# Patient Record
Sex: Female | Born: 1945 | Race: White | Hispanic: No | Smoking: Former smoker
Health system: Southern US, Community
[De-identification: ages and names within clinical notes are randomized; demographics above are authoritative.]

## PROBLEM LIST (undated history)

## (undated) DIAGNOSIS — Z8489 Family history of other specified conditions: Secondary | ICD-10-CM

## (undated) DIAGNOSIS — T4145XA Adverse effect of unspecified anesthetic, initial encounter: Secondary | ICD-10-CM

## (undated) DIAGNOSIS — T8859XA Other complications of anesthesia, initial encounter: Secondary | ICD-10-CM

## (undated) DIAGNOSIS — E039 Hypothyroidism, unspecified: Secondary | ICD-10-CM

## (undated) DIAGNOSIS — I499 Cardiac arrhythmia, unspecified: Secondary | ICD-10-CM

## (undated) DIAGNOSIS — R112 Nausea with vomiting, unspecified: Secondary | ICD-10-CM

## (undated) DIAGNOSIS — I340 Nonrheumatic mitral (valve) insufficiency: Secondary | ICD-10-CM

## (undated) DIAGNOSIS — C73 Malignant neoplasm of thyroid gland: Secondary | ICD-10-CM

## (undated) DIAGNOSIS — M1990 Unspecified osteoarthritis, unspecified site: Secondary | ICD-10-CM

## (undated) DIAGNOSIS — M8580 Other specified disorders of bone density and structure, unspecified site: Secondary | ICD-10-CM

## (undated) DIAGNOSIS — Z905 Acquired absence of kidney: Secondary | ICD-10-CM

## (undated) DIAGNOSIS — Z9889 Other specified postprocedural states: Secondary | ICD-10-CM

## (undated) DIAGNOSIS — I10 Essential (primary) hypertension: Secondary | ICD-10-CM

## (undated) HISTORY — DX: Malignant neoplasm of thyroid gland: C73

## (undated) HISTORY — PX: THYROIDECTOMY: SHX17

## (undated) HISTORY — PX: TONSILLECTOMY: SUR1361

## (undated) HISTORY — DX: Other specified disorders of bone density and structure, unspecified site: M85.80

## (undated) HISTORY — PX: NEPHRECTOMY: SHX65

## (undated) HISTORY — PX: ABDOMINAL HYSTERECTOMY: SHX81

---
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
## 2013-05-11 HISTORY — PX: OTHER SURGICAL HISTORY: SHX169

## 2013-06-06 ENCOUNTER — Ambulatory Visit: Payer: Self-pay | Admitting: Internal Medicine

## 2013-06-22 ENCOUNTER — Ambulatory Visit: Payer: Self-pay | Admitting: Gastroenterology

## 2013-08-28 ENCOUNTER — Ambulatory Visit: Payer: Self-pay | Admitting: Internal Medicine

## 2014-10-05 DIAGNOSIS — N3020 Other chronic cystitis without hematuria: Secondary | ICD-10-CM | POA: Insufficient documentation

## 2014-10-05 DIAGNOSIS — R350 Frequency of micturition: Secondary | ICD-10-CM | POA: Insufficient documentation

## 2014-10-05 DIAGNOSIS — Z905 Acquired absence of kidney: Secondary | ICD-10-CM | POA: Insufficient documentation

## 2015-09-03 ENCOUNTER — Encounter: Payer: Self-pay | Admitting: Emergency Medicine

## 2015-09-03 ENCOUNTER — Emergency Department
Admission: EM | Admit: 2015-09-03 | Discharge: 2015-09-03 | Disposition: A | Discharge status: Home / Self Care | Payer: Medicare PPO | Attending: Emergency Medicine | Admitting: Emergency Medicine

## 2015-09-03 DIAGNOSIS — Z85850 Personal history of malignant neoplasm of thyroid: Secondary | ICD-10-CM | POA: Diagnosis not present

## 2015-09-03 DIAGNOSIS — R51 Headache: Secondary | ICD-10-CM | POA: Diagnosis present

## 2015-09-03 DIAGNOSIS — I160 Hypertensive urgency: Secondary | ICD-10-CM | POA: Diagnosis not present

## 2015-09-03 HISTORY — DX: Essential (primary) hypertension: I10

## 2015-09-03 LAB — CBC WITH DIFFERENTIAL/PLATELET
Basophils Absolute: 0.1 10*3/uL (ref 0–0.1)
Basophils Relative: 2 %
EOS PCT: 2 %
Eosinophils Absolute: 0.1 10*3/uL (ref 0–0.7)
HEMATOCRIT: 39.3 % (ref 35.0–47.0)
HEMOGLOBIN: 13.1 g/dL (ref 12.0–16.0)
LYMPHS ABS: 1.3 10*3/uL (ref 1.0–3.6)
Lymphocytes Relative: 17 %
MCH: 28 pg (ref 26.0–34.0)
MCHC: 33.4 g/dL (ref 32.0–36.0)
MCV: 83.8 fL (ref 80.0–100.0)
Monocytes Absolute: 0.6 10*3/uL (ref 0.2–0.9)
Monocytes Relative: 8 %
Neutro Abs: 5.4 10*3/uL (ref 1.4–6.5)
Neutrophils Relative %: 71 %
PLATELETS: 213 10*3/uL (ref 150–440)
RBC: 4.69 MIL/uL (ref 3.80–5.20)
RDW: 13.1 % (ref 11.5–14.5)
WBC: 7.6 10*3/uL (ref 3.6–11.0)

## 2015-09-03 LAB — BASIC METABOLIC PANEL
Anion gap: 7 (ref 5–15)
BUN: 13 mg/dL (ref 6–20)
CALCIUM: 9.3 mg/dL (ref 8.9–10.3)
CHLORIDE: 106 mmol/L (ref 101–111)
CO2: 26 mmol/L (ref 22–32)
CREATININE: 0.7 mg/dL (ref 0.44–1.00)
GFR calc Af Amer: >60 mL/min (ref >60)
GFR calc non Af Amer: >60 mL/min (ref >60)
Glucose, Bld: 109 mg/dL — ABNORMAL HIGH (ref 65–99)
Potassium: 4.8 mmol/L (ref 3.5–5.1)
SODIUM: 139 mmol/L (ref 135–145)

## 2015-09-03 MED ORDER — ACETAMINOPHEN 325 MG PO TABS
650.0000 mg | ORAL_TABLET | Freq: Once | ORAL | Status: AC
  Administered 2015-09-03: 650 mg via ORAL
  Filled 2015-09-03: qty 2

## 2015-09-03 MED ORDER — CLONIDINE HCL 0.1 MG PO TABS
0.2000 mg | ORAL_TABLET | Freq: Once | ORAL | Status: AC
  Administered 2015-09-03: 0.2 mg via ORAL
  Filled 2015-09-03: qty 2

## 2015-09-03 MED ORDER — CLONIDINE HCL 0.1 MG PO TABS: 0.1000 mg | ORAL_TABLET | Freq: Two times a day (BID) | ORAL | Status: AC | PRN

## 2015-09-03 NOTE — ED Notes (Signed)
Pt presents to ED with headache and elevated blood pressure; onset around 0545 this morning upon waking. Pt states when she noticed her pressure was elevated she took an extra losartan 25mg . Pt states her blood pressure is typically well controlled with medications.

## 2015-09-03 NOTE — ED Provider Notes (Signed)
Time Seen: Approximately *0715  I have reviewed the triage notes  Chief Complaint: Hypertension and Headache   History of Present Illness: Amanda Livingston is a 70 y.o. female *who presents with the acute onset of the headache that she mainly noticed this morning. Patient states that she over the weekend and had consumed more salt than usual. She states she knows that her blood pressure was significantly elevated this morning at 176/104. She takes a low certain at night and then took an extra one this morning. The headache is described as mild without photophobia or weakness in the upper or lower extremities. She denies any neck pain or ataxia. Patient denies any nausea or vomiting. She states she was concerned mainly because she only has one kidney and she tries to keep her blood pressure under control.   Past Medical History  Diagnosis Date  . Hypertension   . Cancer ([place])     thyroid    There are no active problems to display for this patient.   Past Surgical History  Procedure Laterality Date  . Nephrectomy    . Abdominal hysterectomy    . Tonsillectomy      Past Surgical History  Procedure Laterality Date  . Nephrectomy    . Abdominal hysterectomy    . Tonsillectomy      No current outpatient prescriptions on file.  Allergies:  Review of patient's allergies indicates no known allergies.  Family History: No family history on file.  Social History: Social History  Substance Use Topics  . Smoking status: Never Smoker   . Smokeless tobacco: None  . Alcohol Use: No     Review of Systems:   10 point review of systems was performed and was otherwise negative:  Constitutional: No fever Eyes: No visual disturbances ENT: No sore throat, ear pain Cardiac: No chest pain Respiratory: No shortness of breath, wheezing, or stridor Abdomen: No abdominal pain, no vomiting, No diarrhea Endocrine: No weight loss, No night sweats Extremities: No peripheral edema,  cyanosis Skin: No rashes, easy bruising Neurologic: No focal weakness, trouble with speech or swollowing Urologic: No dysuria, Hematuria, or urinary frequency   Physical Exam:  ED Triage Vitals  Enc Vitals Group     BP 09/03/15 0627 188/93 mmHg     Pulse Rate 09/03/15 0627 77     Resp 09/03/15 0627 18     Temp 09/03/15 0627 97.7 F (36.5 C)     Temp Source 09/03/15 0627 Oral     SpO2 09/03/15 0627 97 %     Weight 09/03/15 0627 158 lb (71.668 kg)     Height 09/03/15 0627 5\' 3"  (1.6 m)     Head Cir --      Peak Flow --      Pain Score 09/03/15 0629 5     Pain Loc --      Pain Edu? --      Excl. in [place]? --     General: Awake , Alert , and Oriented times 3; GCS 15 Head: Normal cephalic , atraumatic Eyes: Pupils equal , round, reactive to light Nose/Throat: No nasal drainage, patent upper airway without erythema or exudate.  Neck: Supple, Full range of motion, No anterior adenopathy or palpable thyroid masses Lungs: Clear to ascultation without wheezes , rhonchi, or rales Heart: Regular rate, regular rhythm without murmurs , gallops , or rubs Abdomen: Soft, non tender without rebound, guarding , or rigidity; bowel sounds positive and symmetric in all 4  quadrants. No organomegaly .        Extremities: 2 plus symmetric pulses. No edema, clubbing or cyanosis Neurologic: normal ambulation, Motor symmetric without deficits, sensory intact Skin: warm, dry, no rashes   Labs:   All laboratory work was reviewed including any pertinent negatives or positives listed below:  Labs Reviewed  BASIC METABOLIC PANEL  CBC WITH DIFFERENTIAL/PLATELET    EKG:  ED ECG REPORT I, Daymon Larsen, the attending physician, personally viewed and interpreted this ECG.  Date: 09/03/2015 EKG Time: 0653 Rate: 74 Rhythm: normal sinus rhythm QRS Axis: normal Intervals: normal ST/T Wave abnormalities: normal Conduction Disturbances: none Narrative Interpretation: unremarkable Sinus  arrhythmia No acute ischemic changes were hypertensive findings    ED Course:  Patient's stay here was uneventful and she had symptomatic improvement after giving clonidine 0.2 mg to decrease her blood pressure. She states her headache has resolved. Her blood pressure now is approximately 95 systolic. She'll be discharged for clonidine as needed only. She's been advised continue with her current blood pressure medication and drink plenty of fluids.    Assessment:  Hypertensive urgency      Plan:  Outpatient management Patient was advised to return immediately if condition worsens. Patient was advised to follow up with their primary care physician or other specialized physicians involved in their outpatient care. The patient and/or family member/power of attorney had laboratory results reviewed at the bedside. All questions and concerns were addressed and appropriate discharge instructions were distributed by the nursing staff. Daymon Larsen, MD 09/03/15 [phone]

## 2015-09-03 NOTE — ED Notes (Signed)
Pt presents to ED with c/o headache since last night. Pt reports went to bed but woke up this morning about 5:30 am with anterior headache. Pt took bp at home: 176/104, reports takes 25 mg Losartan at night but reports took another one this morning. Pt denies blurry vision, dizziness, chest pain, or shortness of breath. Pt alert and oriented x 4, no increased work in breathing noted. Skin warm and dry, pt placed on cardiac monitor, will continue to monitor.

## 2016-02-21 DIAGNOSIS — I10 Essential (primary) hypertension: Secondary | ICD-10-CM | POA: Insufficient documentation

## 2016-07-15 DIAGNOSIS — I519 Heart disease, unspecified: Secondary | ICD-10-CM | POA: Insufficient documentation

## 2016-07-24 DIAGNOSIS — I493 Ventricular premature depolarization: Secondary | ICD-10-CM | POA: Insufficient documentation

## 2016-10-06 DIAGNOSIS — R339 Retention of urine, unspecified: Secondary | ICD-10-CM | POA: Insufficient documentation

## 2016-11-25 ENCOUNTER — Other Ambulatory Visit: Payer: Self-pay | Admitting: Internal Medicine

## 2016-11-25 DIAGNOSIS — R05 Cough: Secondary | ICD-10-CM

## 2016-11-25 DIAGNOSIS — Z87891 Personal history of nicotine dependence: Secondary | ICD-10-CM

## 2016-11-25 DIAGNOSIS — R059 Cough, unspecified: Secondary | ICD-10-CM

## 2016-12-02 ENCOUNTER — Ambulatory Visit
Admission: RE | Admit: 2016-12-02 | Discharge: 2016-12-02 | Disposition: A | Discharge status: Home / Self Care | Payer: Medicare PPO | Source: Ambulatory Visit | Attending: Internal Medicine | Admitting: Internal Medicine

## 2016-12-02 DIAGNOSIS — Z87891 Personal history of nicotine dependence: Secondary | ICD-10-CM | POA: Insufficient documentation

## 2016-12-02 DIAGNOSIS — R05 Cough: Secondary | ICD-10-CM | POA: Diagnosis not present

## 2016-12-02 DIAGNOSIS — R059 Cough, unspecified: Secondary | ICD-10-CM

## 2017-03-08 ENCOUNTER — Other Ambulatory Visit: Payer: Self-pay | Admitting: Otolaryngology

## 2017-03-08 DIAGNOSIS — H93A2 Pulsatile tinnitus, left ear: Secondary | ICD-10-CM

## 2017-03-18 ENCOUNTER — Ambulatory Visit
Admission: RE | Admit: 2017-03-18 | Discharge: 2017-03-18 | Disposition: A | Discharge status: Home / Self Care | Payer: Medicare PPO | Source: Ambulatory Visit | Attending: Otolaryngology | Admitting: Otolaryngology

## 2017-03-18 DIAGNOSIS — H93A2 Pulsatile tinnitus, left ear: Secondary | ICD-10-CM | POA: Insufficient documentation

## 2017-04-26 ENCOUNTER — Ambulatory Visit: Payer: Medicare PPO | Admitting: Internal Medicine

## 2017-04-26 ENCOUNTER — Encounter: Payer: Self-pay | Admitting: Internal Medicine

## 2017-04-26 VITALS — BP 120/80 | HR 68 | Resp 16 | Ht 64.0 in | Wt 154.0 lb

## 2017-04-26 DIAGNOSIS — H9312 Tinnitus, left ear: Secondary | ICD-10-CM | POA: Diagnosis not present

## 2017-04-26 DIAGNOSIS — M25552 Pain in left hip: Secondary | ICD-10-CM | POA: Diagnosis not present

## 2017-04-26 DIAGNOSIS — R002 Palpitations: Secondary | ICD-10-CM

## 2017-04-26 DIAGNOSIS — Z0001 Encounter for general adult medical examination with abnormal findings: Secondary | ICD-10-CM | POA: Diagnosis not present

## 2017-04-26 DIAGNOSIS — E785 Hyperlipidemia, unspecified: Secondary | ICD-10-CM | POA: Insufficient documentation

## 2017-04-26 NOTE — Progress Notes (Signed)
Amanda Livingston [place], [place] [address]  Internal MEDICINE  Office Visit Note  Patient Name: Amanda Livingston  [id number]  [id number]  Date of Service: 04/26/2017  Complaints/HPI Pt is here for routine health maintenance examination:  C /o left ear ringing, had MRA done by another physician. Pt reports it to be negative.  Pt follows up with endocrinology for thyroid cancer and hypothyroidism.Pt likes her T4 to be a little elevated. C/O Left leg pain ? sciatica   Current Medication: Outpatient Encounter Medications as of 04/26/2017  Medication Sig  . Biotin 1000 MCG tablet Take 1,000 mcg by mouth daily.  . Calcium Carb-Ergocalciferol 500-200 MG-UNIT TABS Take by mouth.  . cloNIDine (CATAPRES) 0.1 MG tablet Take 1 tablet (0.1 mg total) by mouth 2 (two) times daily as needed.  Marland Kitchen estradiol (ESTRACE) 0.5 MG tablet Take 0.5 mg by mouth daily.  . fluticasone (FLONASE) 50 MCG/ACT nasal spray U 2 SPRAYS IEN ONCE D  . FLUZONE HIGH-DOSE 0.5 ML injection ADM 0.5ML IM UTD  . levothyroxine (SYNTHROID) 137 MCG tablet Take by mouth.  . losartan (COZAAR) 25 MG tablet Take 25 mg by mouth 2 (two) times daily.  . metoprolol succinate (TOPROL-XL) 25 MG 24 hr tablet Take 25 mg by mouth daily. take 1/2 tablet  . Biotin 1 MG CAPS Take by mouth.  . calcium-vitamin D (OSCAL WITH D) 500-200 MG-UNIT tablet Take 1 tablet by mouth daily.  . Omega-3 Fatty Acids (FISH OIL) 1000 MG CAPS Take by mouth daily.  Marland Kitchen tretinoin (RETIN-A) 0.025 % cream Apply topically as directed.  . vitamin C (ASCORBIC ACID) 500 MG tablet Take 500 mg by mouth daily.   No facility-administered encounter medications on file as of 04/26/2017.     Surgical History: Past Surgical History:  Procedure Laterality Date  . ABDOMINAL HYSTERECTOMY    . NEPHRECTOMY    . THYROIDECTOMY    . TONSILLECTOMY      Medical History: Past Medical History:  Diagnosis Date  . Cancer ([place])    thyroid  . Hypertension    . Osteopenia     Family History: Family History  Problem Relation Age of Onset  . Cancer Father       Review of Systems  Genitourinary: Negative.   Psychiatric/Behavioral: Negative.     General: (-) fever, (-) chills, (-) night sweats, (-) weakness, (-) changes in appetite. Skin: (-) rashes, (-) itching,. Eyes: (-) visual changes, (-) redness, (-) itching, (-) double or blurred vision. Nose and Sinuses: (-) nasal stuffiness or itchiness, (-) postnasal drip, (-) nosebleeds, (-) sinus trouble. Mouth and Throat: (-) sore throat, (-) hoarseness. Neck: (-) swollen glands, (-) enlarged thyroid, (-) neck pain. Respiratory: (-) cough, (-) bloody sputum, (-) shortness of breath, (-) wheezing. Cardiovascular: (-) ankle swelling, (-) chest pain. Lymphatic: (-) lymph node enlargement, (-) lymph node tenderness. Neurologic: (-) numbness, (-) tingling,(-) dizziness. Psychiatric: (-) anxiety, (-) depression.  Vital Signs: Blood pressure 120/80, pulse 68, resp. rate 16, height 5\' 4"  (1.626 m), weight 154 lb (69.9 kg), SpO2 98 %.  Examination: General Appearance: The patient is well-developed, well-nourished, and in no distress. Skin: Gross inspection of skin demonstrates no evidence of abnormality. Head: Patient's head is normocephalic, no gross deformities. Eyes: no gross deformities noted. ENT: ears appear grossly normal. Nasopharynx appears to be normal. Neck: Supple. No thyromegaly. No LAD. Respiratory: Lungs are clear to auscultation with no adventitious sounds. Cardiovascular: Normal S1 and S2 without murmur or rub.  Extremities: No cyanosis. pulses are equal. Neurologic: Alert and oriented. No involuntary movements. GI. Neg Breast wnl  LABS: No results found for this or any previous visit (from the past 2160 hour(s)).  Radiology: Mr Amanda Livingston [place] Contrast  Result Date: 03/18/2017 CLINICAL DATA:  71 year old female with pulsatile tinnitus in the left ear for 3-4 months. No  known injury. EXAM: MRA HEAD WITHOUT CONTRAST TECHNIQUE: Angiographic images of the Circle of Willis were obtained using MRA technique without intravenous contrast. COMPARISON:  None. FINDINGS: Visible cerebral volume appears normal with no intracranial mass effect or ventriculomegaly evident. Antegrade flow in the posterior circulation with codominant and normal appearing distal vertebral arteries. Patent PICA origins and vertebrobasilar junction. The Basilar artery, SCA (the left SCA appears duplicated, normal variant) and AICA origins appear normal. Normal PCA origins. Posterior communicating arteries are diminutive or absent. Bilateral PCA branches are within normal limits. No abnormal flow signal identified about either temporal bone. Antegrade flow in both ICA siphons. No siphon stenosis. Normal ophthalmic artery origins. Normal carotid termini, MCA and ACA origins. Anterior communicating artery and visible bilateral ACA branches are within normal limits. Bilateral MCA M1 segments, MCA bifurcations, and visualized bilateral MCA branches are within normal limits. IMPRESSION: Negative intracranial MRA.  No explanation for pulsatile tinnitus. Electronically Signed   By: Genevie Ann M.D.   On: 03/18/2017 14:24    Assessment and Plan: Patient Active Problem List   Diagnosis Date Noted  . Palpitations 04/26/2017  . Hyperlipidemia 04/26/2017  . Incomplete emptying of bladder 10/06/2016  . Premature ventricular contractions 07/24/2016  . Left ventricular diastolic dysfunction 29/47/6546  . Essential hypertension 02/21/2016  . Acquired absence of kidney 10/05/2014  . Chronic cystitis 10/05/2014  . Increased frequency of urination 10/05/2014   Assessment /plan  1. Encounter for general adult medical examination with abnormal findings Routine labs discussed with the pt  2. Palpitations  - US Carotid Duplex Bilateral; Future  3. Hyperlipidemia, unspecified hyperlipidemia type - Stable with meds   4.  Tinnitus of left ear - Await lower the dose of Synthroid   5. Pain of left hip joint - Increase ROM through exercise  General Counseling: I have discussed the findings of the evaluation and examination with Amanda Livingston.  I have also discussed any further diagnostic evaluation thatmay be needed or ordered today. Derotha verbalizes understanding of the findings of todays visit. We also reviewed her medications today and discussed drug interactions and side effects including but not limited excessive drowsiness and altered mental states. We also discussed that there is always a risk not just to her but also people around her. she has been encouraged to call the office with any questions or concerns that should arise related to todays visit.   Time spent: 30 min  I have personally obtained a history, examined the patient, evaluated laboratory and imaging results, formulated the assessment and plan and placed orders.   Lavera Guise, MD  Internal Medicine

## 2017-05-31 ENCOUNTER — Other Ambulatory Visit (INDEPENDENT_AMBULATORY_CARE_PROVIDER_SITE_OTHER): Payer: Medicare HMO

## 2017-05-31 DIAGNOSIS — R0989 Other specified symptoms and signs involving the circulatory and respiratory systems: Secondary | ICD-10-CM | POA: Diagnosis not present

## 2017-05-31 DIAGNOSIS — R002 Palpitations: Secondary | ICD-10-CM

## 2017-06-14 ENCOUNTER — Encounter: Payer: Self-pay | Admitting: Internal Medicine

## 2017-06-14 DIAGNOSIS — F17210 Nicotine dependence, cigarettes, uncomplicated: Secondary | ICD-10-CM | POA: Insufficient documentation

## 2017-06-14 DIAGNOSIS — E039 Hypothyroidism, unspecified: Secondary | ICD-10-CM | POA: Insufficient documentation

## 2017-06-15 ENCOUNTER — Encounter: Payer: Self-pay | Admitting: Internal Medicine

## 2017-06-15 ENCOUNTER — Ambulatory Visit (INDEPENDENT_AMBULATORY_CARE_PROVIDER_SITE_OTHER): Payer: Medicare HMO | Admitting: Internal Medicine

## 2017-06-15 VITALS — BP 136/69 | HR 68 | Resp 16 | Ht 64.0 in | Wt 159.2 lb

## 2017-06-15 DIAGNOSIS — I6523 Occlusion and stenosis of bilateral carotid arteries: Secondary | ICD-10-CM

## 2017-06-15 DIAGNOSIS — R002 Palpitations: Secondary | ICD-10-CM

## 2017-06-15 DIAGNOSIS — I10 Essential (primary) hypertension: Secondary | ICD-10-CM | POA: Diagnosis not present

## 2017-06-15 DIAGNOSIS — E039 Hypothyroidism, unspecified: Secondary | ICD-10-CM

## 2017-06-15 MED ORDER — LOSARTAN POTASSIUM 25 MG PO TABS: 25.0000 mg | ORAL_TABLET | Freq: Two times a day (BID) | ORAL | 3 refills | Status: DC

## 2017-06-15 MED ORDER — TRETINOIN 0.025 % EX CREA: TOPICAL_CREAM | CUTANEOUS | 0 refills | Status: DC

## 2017-06-15 MED ORDER — ESTRADIOL 0.5 MG PO TABS: 0.5000 mg | ORAL_TABLET | Freq: Every day | ORAL | 3 refills | Status: DC

## 2017-06-15 NOTE — Progress Notes (Signed)
[facility] [place], [place] [address]  Internal MEDICINE  Office Visit Note  Patient Name: Amanda Livingston  [id number]  [id number]  Date of Service: 06/15/2017  Chief Complaint  Patient presents with  . Follow-up    carotid results    HPI  Pt is here for routine follow up.    Current Medication: Outpatient Encounter Medications as of 06/15/2017  Medication Sig  . Calcium 500 MG CHEW Chew by mouth daily.  . Cholecalciferol (VITAMIN D3) 1000 units CAPS Take by mouth.  . Biotin 1 MG CAPS Take by mouth.  . Biotin 1000 MCG tablet Take 1,000 mcg by mouth daily.  . cloNIDine (CATAPRES) 0.1 MG tablet Take 1 tablet (0.1 mg total) by mouth 2 (two) times daily as needed. (Patient taking differently: Take by mouth as needed. )  . estradiol (ESTRACE) 0.5 MG tablet Take 0.5 mg by mouth daily.  . fluticasone (FLONASE) 50 MCG/ACT nasal spray U 2 SPRAYS IEN ONCE D  . levothyroxine (SYNTHROID) 137 MCG tablet Take by mouth.  . losartan (COZAAR) 25 MG tablet Take 25 mg by mouth 2 (two) times daily.  . metoprolol succinate (TOPROL-XL) 25 MG 24 hr tablet Take 25 mg by mouth daily. take 1/2 tablet  . Omega-3 Fatty Acids (FISH OIL) 1000 MG CAPS Take by mouth daily.  Marland Kitchen tretinoin (RETIN-A) 0.025 % cream Apply topically as directed.  . vitamin C (ASCORBIC ACID) 500 MG tablet Take 500 mg by mouth daily.  . [DISCONTINUED] Calcium Carb-Ergocalciferol 500-200 MG-UNIT TABS Take by mouth.  . [DISCONTINUED] calcium-vitamin D (OSCAL WITH D) 500-200 MG-UNIT tablet Take 1 tablet by mouth daily.  . [DISCONTINUED] FLUZONE HIGH-DOSE 0.5 ML injection ADM 0.5ML IM UTD   No facility-administered encounter medications on file as of 06/15/2017.     Surgical History: Past Surgical History:  Procedure Laterality Date  . ABDOMINAL HYSTERECTOMY    . NEPHRECTOMY Left   . THYROIDECTOMY    . TONSILLECTOMY Right     Medical History: Past Medical History:  Diagnosis Date  . Hypertension   .  Osteopenia   . 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[place])    thyroid    Family History: Family History  Problem Relation Age of Onset  . Cancer Father     Social History   Socioeconomic History  . Marital status: Divorced    Spouse name: Not on file  . Number of children: Not on file  . Years of education: Not on file  . Highest education level: Not on file  Social Needs  . Financial resource strain: Not on file  . Food insecurity - worry: Not on file  . Food insecurity - inability: Not on file  . Transportation needs - medical: Not on file  . Transportation needs - non-medical: Not on file  Occupational History  . Not on file  Tobacco Use  . Smoking status: Never Smoker  . Smokeless tobacco: Never Used  Substance and Sexual Activity  . Alcohol use: No  . Drug use: No  . Sexual activity: Not on file  Other Topics Concern  . Not on file  Social History Narrative  . Not on file   Review of Systems  Constitutional: Negative for chills, diaphoresis and fatigue.  HENT: Negative for ear pain, postnasal drip and sinus pressure.   Eyes: Negative for photophobia, discharge, redness, itching and visual disturbance.  Respiratory: Negative for cough, shortness of breath and wheezing.   Cardiovascular: Negative for chest pain, palpitations and  leg swelling.  Gastrointestinal: Negative for abdominal pain, constipation, diarrhea, nausea and vomiting.  Genitourinary: Negative for dysuria and flank pain.  Musculoskeletal: Negative for arthralgias, back pain, gait problem and neck pain.  Skin: Negative for color change.  Allergic/Immunologic: Negative for environmental allergies and food allergies.  Neurological: Negative for dizziness and headaches.  Hematological: Does not bruise/bleed easily.  Psychiatric/Behavioral: Negative for agitation, behavioral problems (depression ) and hallucinations.   Vital Signs: BP 136/69 (BP Location: Left Arm, Patient Position: Sitting)   Pulse 68   Resp 16   Ht  5\' 4"  (1.626 m)   Wt 159 lb 3.2 oz (72.2 kg)   SpO2 96%   BMI 27.33 kg/m   Physical Exam  Constitutional: She is oriented to person, place, and time. She appears well-developed and well-nourished. No distress.  HENT:  Head: Normocephalic and atraumatic.  Mouth/Throat: Oropharynx is clear and moist. No oropharyngeal exudate.  Eyes: EOM are normal. Pupils are equal, round, and reactive to light.  Neck: Normal range of motion. Neck supple. No JVD present.  Cardiovascular: Normal rate, regular rhythm and normal heart sounds.  No murmur heard. Pulmonary/Chest: Effort normal. No respiratory distress. She exhibits no tenderness.  Abdominal: Soft. Bowel sounds are normal.  Musculoskeletal: Normal range of motion.  Lymphadenopathy:    She has no cervical adenopathy.  Neurological: She is alert and oriented to person, place, and time. No cranial nerve deficit.  Skin: Skin is warm and dry. She is not diaphoretic.  Psychiatric: She has a normal mood and affect. Her behavior is normal.   Assessment/Plan: 1. Essential hypertension, benign - losartan (COZAAR) 25 MG tablet; Take 1 tablet (25 mg total) by mouth 2 (two) times daily.  Dispense: 90 tablet; Refill: 3  2. Hypothyroidism, unspecified type - Continue synthroid as before  3. Atherosclerosis of both carotid arteries - ASA 81 mg qd General Counseling: jara feider understanding of the findings of todays visit and agrees with plan of treatment. I have discussed any further diagnostic evaluation that may be needed or ordered today. We also reviewed her medications today. she has been encouraged to call the office with any questions or concerns that should arise related to todays visit.  Time spent: [place] [facility]

## 2017-06-22 NOTE — Patient Instructions (Signed)
Atherosclerosis °Atherosclerosis is narrowing and hardening of the blood vessels (arteries). Arteries are tubes that carry blood that contains oxygen from the heart to all parts of the body. Arteries can become narrow or clogged with a buildup of fat, cholesterol, calcium, or other substances (plaque). Plaque decreases the amount of blood that can flow through the artery. °Atherosclerosis can affect any artery in the body, including: °· Heart arteries (coronary artery disease), which may cause heart attack. °· Brain arteries, which may cause stroke. °· Leg, arm, and pelvis arteries (peripheral artery disease), which may cause pain and numbness. °· Kidney arteries, which may cause kidney (renal) failure. ° °Treatment may slow the disease and prevent further damage to the heart, brain, peripheral arteries, and kidneys. °What are the causes? °Atherosclerosis develops when plaque forms in an artery. This damages the inside wall of the artery. Over time, the plaque grows and hardens. It may break through the artery wall. This causes a blood clot to form over the break, which narrows the artery more. The clot may also break loose and travel to other arteries, causing more damage. °What increases the risk? °This condition is more likely to develop in people who: °· Are middle age or older. °· Have a family history of atherosclerosis. °· Have high cholesterol. °· Have high blood fats (triglycerides). °· Have diabetes. °· Are overweight. °· Smoke tobacco. °· Do not exercise enough. °· Have a substance in the blood that indicates increased levels of inflammation in the body (C-reactive protein, or CRP). °· Have sleep apnea. °· Are stressed. °· Drink too much alcohol. ° °What are the signs or symptoms? °This condition may not cause any symptoms. If you do have symptoms, they are caused by damage to an area of your body that is not getting enough blood. The following symptoms are possible: °· Coronary artery disease may cause  chest pain and shortness of breath. °· Decreased blood supply to your brain may cause a stroke. Signs and symptoms of stroke may include sudden: °? Weakness on one side of the body. °? Confusion. °? Changes in vision. °? Inability to speak or understand speech. °? Loss of balance, coordination, or ability to walk. °? Severe headache. °? Loss of consciousness. °· Peripheral artery disease may cause pain and numbness, often in the legs and hips. °· Renal failure may cause fatigue, nausea, swelling, and itchy skin. ° °How is this diagnosed? °This condition is diagnosed based on your medical history and a physical exam. During the exam, your health care provider will check your pulses and listen for a "whooshing" sound over your arteries (bruit). You may have tests, such as: °· Blood tests to check your levels of cholesterol, triglycerides, and CRP. °· Electrocardiogram (ECG) to check for heart damage. °· Chest X-ray to see if your heart is enlarged, which is a sign of heart failure. °· Stress test to see how your heart reacts to exercise. °· Echocardiogram to get images of the inside of your heart. °· Ankle-Brachial index to compare blood pressure in your arms to blood pressure in your ankles. °· Ultrasound of your peripheral arteries to check blood flow. °· CT scan to check for damage to your heart or brain. °· X-rays of blood vessels after dye has been injected (angiogram) to check blood flow. ° °How is this treated? °Treatment starts with lifestyle changes, which may include: °· Changing your diet. °· Losing weight. °· Reducing stress. °· Exercising and being more physically active. °· Not smoking. ° °  You also may need medicine to: °· Lower triglycerides and cholesterol. °· Lower and control blood pressure. °· Prevent blood clots. °· Lower inflammation in your body. °· Lower and control your blood sugar. ° °Sometimes, surgery is needed to remove plaque, widen your artery, or create a new path for your blood  (bypass). Surgical treatment may include: °· Removing plaque from an artery (endarterectomy). °· Opening a narrowed heart artery (angioplasty). °· Heart or peripheral artery bypass graft surgery. ° °Follow these instructions at home: °Lifestyle ° °· Eat a heart-healthy diet. Talk to your health care provider or a diet specialist (dietitian) if you need help. A heart-healthy diet includes: °? Limiting unhealthy fats and increasing healthy fats. Some examples of healthy fats are olive oil and canola oil. °? Eating plant-based foods, such as fruits, vegetables, nuts, legumes, and whole grains. °· Follow an exercise program as told by your health care provider. °· Maintain a healthy weight. Lose weight if directed by your health care provider. °· Rest when you are tired. °· Learn to manage your stress. °· Do not use any tobacco products, such as cigarettes, chewing tobacco, and e-cigarettes. If you need help quitting, ask your health care provider. °· Limit alcohol intake to no more than 1 drink a day for nonpregnant women and 2 drinks a day for men. One drink equals 12 oz of beer, 5 oz of wine, or 1½ oz of hard liquor. °· Do not abuse drugs. °General instructions °· Take over-the-counter and prescription medicines only as told by your health care provider. °· Manage other health conditions as told by your health care provider. °· Keep all follow-up visits as told by your health care provider. This is important. °Contact a health care provider if: °· You have chest pain or discomfort. This includes squeezing chest pain that may feel like indigestion (angina). °· You have shortness of breath. °· You have an irregular heartbeat. °· You have unexplained fatigue. °· You have unexplained pain or numbness in an arm, leg, or hip. °· You have nausea, swelling of your hands or feet, and itchy skin. °Get help right away if: °· You have symptoms of a heart attack, such as: °? Chest pain. °? Shortness of breath. °? Pain in your  neck, jaw, arms, back, or stomach. °? Cold sweat. °? Nausea. °? Light-headedness. °· You have symptoms of a stroke, such as sudden: °? Weakness on one side of your body. °? Confusion. °? Changes in vision. °? Inability to speak or understand speech. °? Loss of balance, coordination, or ability to walk. °? Severe headache. °? Loss of consciousness. °These symptoms may represent a serious problem that is an emergency. Do not wait to see if the symptoms will go away. Get medical help right away. Call your local emergency services (911 in the [place] Do not drive yourself to the hospital. °This information is not intended to replace advice given to you by your health care provider. Make sure you discuss any questions you have with your health care provider. °Document Released: 07/18/2003 Document Revised: 10/03/2015 Document Reviewed: 03/18/2015 °Elsevier Interactive Patient Education © 2018 [employer] ° °

## 2017-06-22 NOTE — Progress Notes (Unsigned)
[place], [place] [address]  DATE OF SERVICE:  May 31, 2017  CAROTID DOPPLER INTERPRETATION:  Bilateral Carotid Ultrsasound and Color Doppler Examination was performed. The RIGHT CCA shows  mild plaque in the vessel. The LEFT CCA shows  mild plaque in the vessel. There was  no intimal thickening noted in the RIGHT carotid artery. There was  no intimal thickening in the LEFT carotid artery.  The RIGHT CCA shows peak systolic velocity of  34LP per second. The end diastolic velocity is  37TK per second on the RIGHT side. The RIGHT ICA shows peak systolic velocity of  66 per second. RIGHT sided ICA end diastolic velocity is  24OX per second. The RIGHT ECA shows a peak systolic velocity of  73ZH per second. The ICA/CCA ratio is calculated to be  0.93. This suggests  No significant stenosis. The Vertebral Artery shows  antegrade flow.  There appears to be no hemodynamically significant stenosis on the RIGHT side.  The LEFT CCA shows peak systolic velocity of  29JM per second. The end diastolic velocity is  42AS per second on the LEFT side. The LEFT ICA shows peak systolic velocity of  34HDQ second. LEFT sided ICA end diastolic velocity is  22WL per second. The LEFT ECA shows a peak systolic velocity of  79GX per second. The ICA/CCA ratio is calculated to be  1.19. This suggests  No significant stenosis. The Vertebral Artery shows  antegrade flow.  There appears to be  No hemodynamically significant stenosis on the LEFT side.   Impression:    There  Is no significant Carotid stenosis noted on the RIGHT side. There  Is no significant Carotid stenosis noted on the LEFT side. There is  mild plaque formation noted on the LEFT and  also on the RIGHT  side. Consider a repeat Carotid doppler if clinical situation and symptoms warrant in 6-12 months. Patient should be encouraged to change lifestyles such as smoking cessation, regular exercise and dietary modification. Use  of statins in the right clinical setting and ASA is encouraged.  Allyne Gee, MD [facility] Pulmonary Critical Care Medicine

## 2017-06-23 NOTE — Procedures (Signed)
[place], [place] [address]  DATE OF SERVICE:  May 31, 2017  CAROTID DOPPLER INTERPRETATION:  Bilateral Carotid Ultrsasound and Color Doppler Examination was performed. The RIGHT CCA shows  mild plaque in the vessel. The LEFT CCA shows  mild plaque in the vessel. There was  no intimal thickening noted in the RIGHT carotid artery. There was  no intimal thickening in the LEFT carotid artery.  The RIGHT CCA shows peak systolic velocity of  94FE per second. The end diastolic velocity is  76DY per second on the RIGHT side. The RIGHT ICA shows peak systolic velocity of  66 per second. RIGHT sided ICA end diastolic velocity is  70LK per second. The RIGHT ECA shows a peak systolic velocity of  95FM per second. The ICA/CCA ratio is calculated to be  0.93. This suggests  No significant stenosis. The Vertebral Artery shows  antegrade flow.  There appears to be no hemodynamically significant stenosis on the RIGHT side.  The LEFT CCA shows peak systolic velocity of  73UY per second. The end diastolic velocity is  37QD per second on the LEFT side. The LEFT ICA shows peak systolic velocity of  64RCV second. LEFT sided ICA end diastolic velocity is  81MM per second. The LEFT ECA shows a peak systolic velocity of  03FV per second. The ICA/CCA ratio is calculated to be  1.19. This suggests  No significant stenosis. The Vertebral Artery shows  antegrade flow.  There appears to be  No hemodynamically significant stenosis on the LEFT side.   Impression:    There  Is no significant Carotid stenosis noted on the RIGHT side. There  Is no significant Carotid stenosis noted on the LEFT side. There is  mild plaque formation noted on the LEFT and  also on the RIGHT  side. Consider a repeat Carotid doppler if clinical situation and symptoms warrant in 6-12 months. Patient should be encouraged to change lifestyles such as smoking cessation, regular exercise and dietary modification. Use  of statins in the right clinical setting and ASA is encouraged.  Allyne Gee, MD [facility] Pulmonary Critical Care Medicine

## 2017-07-02 DIAGNOSIS — E559 Vitamin D deficiency, unspecified: Secondary | ICD-10-CM | POA: Diagnosis not present

## 2017-07-02 DIAGNOSIS — E890 Postprocedural hypothyroidism: Secondary | ICD-10-CM | POA: Diagnosis not present

## 2017-07-09 DIAGNOSIS — E890 Postprocedural hypothyroidism: Secondary | ICD-10-CM | POA: Diagnosis not present

## 2017-07-09 DIAGNOSIS — C73 Malignant neoplasm of thyroid gland: Secondary | ICD-10-CM | POA: Diagnosis not present

## 2017-07-27 DIAGNOSIS — R002 Palpitations: Secondary | ICD-10-CM | POA: Diagnosis not present

## 2017-07-27 DIAGNOSIS — I519 Heart disease, unspecified: Secondary | ICD-10-CM | POA: Diagnosis not present

## 2017-07-27 DIAGNOSIS — I493 Ventricular premature depolarization: Secondary | ICD-10-CM | POA: Diagnosis not present

## 2017-07-27 DIAGNOSIS — I10 Essential (primary) hypertension: Secondary | ICD-10-CM | POA: Diagnosis not present

## 2017-08-16 ENCOUNTER — Other Ambulatory Visit: Payer: Self-pay

## 2017-08-16 MED ORDER — ESTRADIOL 0.5 MG PO TABS: 0.5000 mg | ORAL_TABLET | Freq: Every day | ORAL | 3 refills | Status: DC

## 2017-08-26 DIAGNOSIS — H2513 Age-related nuclear cataract, bilateral: Secondary | ICD-10-CM | POA: Diagnosis not present

## 2017-08-31 ENCOUNTER — Ambulatory Visit: Payer: Self-pay | Admitting: Nurse Practitioner

## 2017-09-16 ENCOUNTER — Other Ambulatory Visit: Payer: Self-pay

## 2017-09-16 ENCOUNTER — Ambulatory Visit (INDEPENDENT_AMBULATORY_CARE_PROVIDER_SITE_OTHER): Payer: Medicare HMO | Admitting: Gastroenterology

## 2017-09-16 ENCOUNTER — Encounter: Payer: Self-pay | Admitting: Gastroenterology

## 2017-09-16 VITALS — BP 117/73 | HR 72 | Temp 98.1°F | Ht 64.0 in | Wt 155.0 lb

## 2017-09-16 DIAGNOSIS — K449 Diaphragmatic hernia without obstruction or gangrene: Secondary | ICD-10-CM

## 2017-09-16 DIAGNOSIS — K209 Esophagitis, unspecified without bleeding: Secondary | ICD-10-CM

## 2017-09-16 DIAGNOSIS — K219 Gastro-esophageal reflux disease without esophagitis: Secondary | ICD-10-CM | POA: Diagnosis not present

## 2017-09-16 MED ORDER — RANITIDINE HCL 75 MG PO TABS: 75.0000 mg | ORAL_TABLET | Freq: Two times a day (BID) | ORAL | 2 refills | Status: DC

## 2017-09-16 NOTE — Progress Notes (Signed)
Amanda Livingston [address]  [place]  [place], [place] [address]  Main: [phone]  Fax: [phone]   Gastroenterology Consultation  Referring Provider:     Lavera Guise, MD Primary Care Physician:  Amanda Guise, MD Primary Gastroenterologist:  Dr. Vonda Livingston Reason for Consultation:     Worsening reflux        HPI:    Chief Complaint  Patient presents with  . Establish Care    referral from Dr. Stephanie Coup- GERD, WORSENING     Amanda Livingston is a 72 y.o. y/o female referred for consultation & management  by Dr. Humphrey Rolls, Timoteo Gaul, MD.  Patient reports intermittent episodes of acid reflux.  Describes it as a heartburn sensation.  No dysphagia, no weight loss, no abdominal pain.  No altered bowel habits.  No blood in stool.  Last EGD February 2015 by Dr. Allen Norris, for heartburn, showed grade a esophagitis, medium hiatal hernia, gastric erythema, biopsies were done.  Which showed reactive foveolar hyperplasia, and mild superficial vascular congestion.  Negative for H. pylori. Last colonoscopy, February 2015 for screening by Dr. Allen Norris with a 5 mm descending colon polyp removed, and internal hemorrhoids.  Biopsy showed hyperplastic polyp.  Repeat was recommended in 10 years if biopsies show hyperplastic polyp.  Patient reports taking PPI after that endoscopy, and her heartburn was well controlled, and then she eventually stopped the medication.  Over the last 2 to 3 months, she has had episodes of heartburn, and she has taken Prilosec for 2 weeks, which relieved the heartburn and she stopped taking the medication.  Then she took Nexium for 2 weeks after heartburn recurred, and then relieved the symptoms again.  However, symptoms recur once she stops the medications.  She has recently read the side effects of the medications, especially kidney dysfunction, and she does not want to be on these medications long-term.  She takes Zantac at bedtime which helps as well.    Past  Medical History:  Diagnosis Date  . Hypertension   . Osteopenia   . 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[place])    thyroid    Past Surgical History:  Procedure Laterality Date  . ABDOMINAL HYSTERECTOMY    . NEPHRECTOMY Left   . THYROIDECTOMY    . TONSILLECTOMY Right     Prior to Admission medications   Medication Sig Start Date End Date Taking? Authorizing Provider  Biotin 1 MG CAPS Take by mouth.    [provider]  Biotin 1000 MCG tablet Take 1,000 mcg by mouth daily.    [provider]  Calcium 500 MG CHEW Chew by mouth daily.    [provider]  Cholecalciferol (VITAMIN D3) 1000 units CAPS Take by mouth.    [provider]  cloNIDine (CATAPRES) 0.1 MG tablet Take 1 tablet (0.1 mg total) by mouth 2 (two) times daily as needed. Patient taking differently: Take by mouth as needed.  09/03/15   Daymon Larsen, MD  estradiol (ESTRACE) 0.5 MG tablet Take 1 tablet (0.5 mg total) by mouth daily. 08/16/17   Ronnell Freshwater, NP  fluticasone (FLONASE) 50 MCG/ACT nasal spray U 2 SPRAYS IEN ONCE D 02/23/17   [provider]  levothyroxine (SYNTHROID) 137 MCG tablet Take by mouth. 12/30/15   [provider]  losartan (COZAAR) 25 MG tablet Take 1 tablet (25 mg total) by mouth 2 (two) times daily. 06/15/17   Amanda Guise, MD  metoprolol succinate (TOPROL-XL) 25 MG 24  hr tablet Take 25 mg by mouth daily. take 1/2 tablet    [provider]  Omega-3 Fatty Acids (FISH OIL) 1000 MG CAPS Take by mouth daily.    [provider]  tretinoin (RETIN-A) 0.025 % cream Apply topically as directed. 06/15/17   Amanda Guise, MD  vitamin C (ASCORBIC ACID) 500 MG tablet Take 500 mg by mouth daily.    [provider]    Family History  Problem Relation Age of Onset  . Cancer Father      Social History   Tobacco Use  . Smoking status: Never Smoker  . Smokeless tobacco: Never Used  Substance Use Topics  . Alcohol use: No  . Drug use: No     Allergies as of 09/16/2017 - Review Complete 06/22/2017  Allergen Reaction Noted  . Macrobid [nitrofurantoin macrocrystal]  04/16/2017    Review of Systems:    All systems reviewed and negative except where noted in HPI.   Physical Exam:  There were no vitals taken for this visit. No LMP recorded. Patient has had a hysterectomy. Psych:  Alert and cooperative. Normal mood and affect. General:   Alert,  Well-developed, well-nourished, pleasant and cooperative in NAD Head:  Normocephalic and atraumatic. Eyes:  Sclera clear, no icterus.   Conjunctiva pink. Ears:  Normal auditory acuity. Nose:  No deformity, discharge, or lesions. Mouth:  No deformity or lesions,oropharynx pink & moist. Neck:  Supple; no masses or thyromegaly. Lungs:  Respirations even and unlabored.  Clear throughout to auscultation.   No wheezes, crackles, or rhonchi. No acute distress. Heart:  Regular rate and rhythm; no murmurs, clicks, rubs, or gallops. Abdomen:  Normal bowel sounds.  No bruits.  Soft, non-tender and non-distended without masses, hepatosplenomegaly or hernias noted.  No guarding or rebound tenderness.    Msk:  Symmetrical without gross deformities. Good, equal movement & strength bilaterally. Pulses:  Normal pulses noted. Extremities:  No clubbing or edema.  No cyanosis. Neurologic:  Alert and oriented x3;  grossly normal neurologically. Skin:  Intact without significant lesions or rashes. No jaundice. Lymph Nodes:  No significant cervical adenopathy. Psych:  Alert and cooperative. Normal mood and affect.   Labs: CBC    Component Value Date/Time   WBC 7.6 09/03/2015 0758   RBC 4.69 09/03/2015 0758   HGB 13.1 09/03/2015 0758   HCT 39.3 09/03/2015 0758   PLT 213 09/03/2015 0758   MCV 83.8 09/03/2015 0758   MCH 28.0 09/03/2015 0758   MCHC 33.4 09/03/2015 0758   RDW 13.1 09/03/2015 0758   LYMPHSABS 1.3 09/03/2015 0758   MONOABS 0.6 09/03/2015 0758   EOSABS 0.1 09/03/2015 0758    BASOSABS 0.1 09/03/2015 0758   CMP     Component Value Date/Time   NA 139 09/03/2015 0758   K 4.8 09/03/2015 0758   CL 106 09/03/2015 0758   CO2 26 09/03/2015 0758   GLUCOSE 109 (H) 09/03/2015 0758   BUN 13 09/03/2015 0758   CREATININE 0.70 09/03/2015 0758   CALCIUM 9.3 09/03/2015 0758   GFRNONAA >60 09/03/2015 0758   GFRAA >60 09/03/2015 0758    Previous EGD and colonoscopy report and images reviewed in provation.  Pathology report from [place] obtained and reviewed as well.  Assessment and Plan:   ONETHA GAFFEY is a 72 y.o. y/o female has been referred for intermittent acid reflux With previous history of grade a esophagitis, medium hiatal hernia diagnosed in 2015 EGD  Acid reflux responds well to  PPI However, patient would like to not be on these medications long-term, given the renal side effects Patient educated extensively on acid reflux lifestyle modification, including buying a bed wedge, not eating 3 hrs before bedtime, diet modifications, and handout given for the same.   Since Zantac helps, I have asked patient to start taking Zantac twice a day instead of once a day to see if it helps her symptoms in addition to the above acid reflux lifestyle modifications If changes in lifestyle, leads to improvement in acid reflux, Zantac can be decreased again  Given her advanced age, recurrence of symptoms, previous history of esophagitis, patient would like to make sure, there is no underlying lesions or underlying esophagitis.   We have discussed conservative management with lifestyle modifications and Zantac and monitor improvement versus endoscopic evaluation.  She would like to proceed with endoscopic evaluation, and risks and benefits of procedure were discussed in detail.  I have discussed alternative options, risks & benefits,  which include, but are not limited to, bleeding, infection, perforation,respiratory complication & drug reaction.  The patient agrees with this plan  & written consent will be obtained.    No altered bowel habits, blood in stool, or any other symptoms present to indicate colonoscopy at this time  Patient will call us if symptoms do not improve with Zantac, and lifestyle modifications.  We will follow-up in clinic to assess symptoms as well.  Dr Amanda Livingston

## 2017-09-16 NOTE — Patient Instructions (Signed)
F/u 3 months Zantac 2x day  Gastroesophageal Reflux Disease, Adult Normally, food travels down the esophagus and stays in the stomach to be digested. If a person has gastroesophageal reflux disease (GERD), food and stomach acid move back up into the esophagus. When this happens, the esophagus becomes sore and swollen (inflamed). Over time, GERD can make small holes (ulcers) in the lining of the esophagus. Follow these instructions at home: Diet  Follow a diet as told by your doctor. You may need to avoid foods and drinks such as: ? Coffee and tea (with or without caffeine). ? Drinks that contain alcohol. ? Energy drinks and sports drinks. ? Carbonated drinks or sodas. ? Chocolate and cocoa. ? Peppermint and mint flavorings. ? Garlic and onions. ? Horseradish. ? Spicy and acidic foods, such as peppers, chili powder, curry powder, vinegar, hot sauces, and BBQ sauce. ? Citrus fruit juices and citrus fruits, such as oranges, lemons, and limes. ? Tomato-based foods, such as red sauce, chili, salsa, and pizza with red sauce. ? Fried and fatty foods, such as donuts, french fries, potato chips, and high-fat dressings. ? High-fat meats, such as hot dogs, rib eye steak, sausage, ham, and bacon. ? High-fat dairy items, such as whole milk, butter, and cream cheese.  Eat small meals often. Avoid eating large meals.  Avoid drinking large amounts of liquid with your meals.  Avoid eating meals during the 2-3 hours before bedtime.  Avoid lying down right after you eat.  Do not exercise right after you eat. General instructions  Pay attention to any changes in your symptoms.  Take over-the-counter and prescription medicines only as told by your doctor. Do not take aspirin, ibuprofen, or other NSAIDs unless your doctor says it is okay.  Do not use any tobacco products, including cigarettes, chewing tobacco, and e-cigarettes. If you need help quitting, ask your doctor.  Wear loose clothes. Do  not wear anything tight around your waist.  Raise (elevate) the head of your bed about 6 inches (15 cm).  Try to lower your stress. If you need help doing this, ask your doctor.  If you are overweight, lose an amount of weight that is healthy for you. Ask your doctor about a safe weight loss goal.  Keep all follow-up visits as told by your doctor. This is important. Contact a doctor if:  You have new symptoms.  You lose weight and you do not know why it is happening.  You have trouble swallowing, or it hurts to swallow.  You have wheezing or a cough that keeps happening.  Your symptoms do not get better with treatment.  You have a hoarse voice. Get help right away if:  You have pain in your arms, neck, jaw, teeth, or back.  You feel sweaty, dizzy, or light-headed.  You have chest pain or shortness of breath.  You throw up (vomit) and your throw up looks like blood or coffee grounds.  You pass out (faint).  Your poop (stool) is bloody or black.  You cannot swallow, drink, or eat. This information is not intended to replace advice given to you by your health care provider. Make sure you discuss any questions you have with your health care provider. Document Released: 10/14/2007 Document Revised: 10/03/2015 Document Reviewed: 08/22/2014 Elsevier Interactive Patient Education  [employer].

## 2017-09-24 ENCOUNTER — Encounter: Payer: Self-pay | Admitting: Nurse Practitioner

## 2017-09-24 ENCOUNTER — Ambulatory Visit (INDEPENDENT_AMBULATORY_CARE_PROVIDER_SITE_OTHER): Payer: Medicare HMO | Admitting: Nurse Practitioner

## 2017-09-24 VITALS — BP 124/65 | HR 65 | Resp 16 | Ht 64.0 in | Wt 155.6 lb

## 2017-09-24 DIAGNOSIS — L819 Disorder of pigmentation, unspecified: Secondary | ICD-10-CM

## 2017-09-24 DIAGNOSIS — I10 Essential (primary) hypertension: Secondary | ICD-10-CM

## 2017-09-24 DIAGNOSIS — J3489 Other specified disorders of nose and nasal sinuses: Secondary | ICD-10-CM

## 2017-09-24 DIAGNOSIS — J3481 Nasal mucositis (ulcerative): Secondary | ICD-10-CM

## 2017-09-24 MED ORDER — TRETINOIN 0.025 % EX CREA
TOPICAL_CREAM | CUTANEOUS | 4 refills | Status: DC
Start: 2017-09-24 — End: 2018-10-13

## 2017-09-24 MED ORDER — LOSARTAN POTASSIUM 25 MG PO TABS: 25.0000 mg | ORAL_TABLET | Freq: Two times a day (BID) | ORAL | 4 refills | Status: DC

## 2017-09-24 MED ORDER — FLUTICASONE PROPIONATE 50 MCG/ACT NA SUSP: 2.0000 | Freq: Every day | NASAL | 5 refills | Status: DC

## 2017-09-24 NOTE — Progress Notes (Signed)
[facility] [place], [place] [address]  Internal MEDICINE  Office Visit Note  Patient Name: Amanda Livingston  [id number]  [id number]  Date of Service: 10/18/2017   Pt is here for routine follow up.    Chief Complaint  Patient presents with  . Hypertension    Hypertension  This is a chronic problem. The current episode started more than 1 year ago. The problem is unchanged. The problem is controlled. Pertinent negatives include no chest pain, headaches, neck pain, palpitations or shortness of breath. Agents associated with hypertension include estrogens and thyroid hormones. Risk factors for coronary artery disease include post-menopausal state and stress. Past treatments include central alpha agonists, beta blockers and angiotensin blockers. The current treatment provides significant improvement. There are no compliance problems.       Current Medication: Outpatient Encounter Medications as of 09/24/2017  Medication Sig Note  . Biotin 1000 MCG tablet Take 1,000 mcg by mouth daily.   . Calcium 500 MG CHEW Chew by mouth daily.   . Cholecalciferol (VITAMIN D3) 1000 units CAPS Take by mouth.   . cloNIDine (CATAPRES) 0.1 MG tablet Take 1 tablet (0.1 mg total) by mouth 2 (two) times daily as needed. (Patient taking differently: Take by mouth as needed. )   . estradiol (ESTRACE) 0.5 MG tablet Take 1 tablet (0.5 mg total) by mouth daily. 09/16/2017: Every other day per pt  . fluticasone (FLONASE) 50 MCG/ACT nasal spray Place 2 sprays into both nostrils daily. (Patient not taking: Reported on 10/11/2017)   . levothyroxine (SYNTHROID) 137 MCG tablet Take by mouth.   . losartan (COZAAR) 25 MG tablet Take 1 tablet (25 mg total) by mouth 2 (two) times daily.   . metoprolol succinate (TOPROL-XL) 25 MG 24 hr tablet Take 25 mg by mouth daily. take 1/2 tablet 09/16/2017: Taking 1/2 tab. Every day  . Omega-3 Fatty Acids (FISH OIL) 1000 MG CAPS Take by mouth daily.   Marland Kitchen tretinoin  (RETIN-A) 0.025 % cream Apply topically as needed   . vitamin C (ASCORBIC ACID) 500 MG tablet Take 500 mg by mouth daily.   . [DISCONTINUED] Biotin 1 MG CAPS Take by mouth.   . [DISCONTINUED] fluticasone (FLONASE) 50 MCG/ACT nasal spray U 2 SPRAYS IEN ONCE D   . [DISCONTINUED] losartan (COZAAR) 25 MG tablet Take 1 tablet (25 mg total) by mouth 2 (two) times daily.   . [DISCONTINUED] ranitidine (ZANTAC 75) 75 MG tablet Take 1 tablet (75 mg total) by mouth 2 (two) times daily.   . [DISCONTINUED] tretinoin (RETIN-A) 0.025 % cream Apply topically as directed.    No facility-administered encounter medications on file as of 09/24/2017.     Surgical History: Past Surgical History:  Procedure Laterality Date  . ABDOMINAL HYSTERECTOMY    . ESOPHAGOGASTRODUODENOSCOPY (EGD) WITH PROPOFOL N/A 09/30/2017   Procedure: ESOPHAGOGASTRODUODENOSCOPY (EGD) WITH PROPOFOL;  Surgeon: Virgel Manifold, MD;  Location: [facility] ENDOSCOPY;  Service: Endoscopy;  Laterality: N/A;  . NEPHRECTOMY Left   . THYROIDECTOMY    . TONSILLECTOMY Right     Medical History: Past Medical History:  Diagnosis Date  . Hypertension   . Osteopenia   . 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[place])    thyroid    Family History: Family History  Problem Relation Age of Onset  . Cancer Father     Social History   Socioeconomic History  . Marital status: Divorced    Spouse name: Not on file  . Number of children: Not on file  .  Years of education: Not on file  . Highest education level: Not on file  Occupational History  . Not on file  Social Needs  . Financial resource strain: Not on file  . Food insecurity:    Worry: Not on file    Inability: Not on file  . Transportation needs:    Medical: Not on file    Non-medical: Not on file  Tobacco Use  . Smoking status: Former Smoker    Last attempt to quit: 2004    Years since quitting: 15.4  . Smokeless tobacco: Never Used  Substance and Sexual Activity  . Alcohol use: No  . Drug use: No   . Sexual activity: Not on file  Lifestyle  . Physical activity:    Days per week: Not on file    Minutes per session: Not on file  . Stress: Not on file  Relationships  . Social connections:    Talks on phone: Not on file    Gets together: Not on file    Attends religious service: Not on file    Active member of club or organization: Not on file    Attends meetings of clubs or organizations: Not on file    Relationship status: Not on file  . Intimate partner violence:    Fear of current or ex partner: Not on file    Emotionally abused: Not on file    Physically abused: Not on file    Forced sexual activity: Not on file  Other Topics Concern  . Not on file  Social History Narrative  . Not on file      Review of Systems  Constitutional: Negative for activity change, chills, diaphoresis, fatigue and unexpected weight change.  HENT: Negative for ear pain, postnasal drip and sinus pressure.   Eyes: Negative for photophobia, discharge, redness, itching and visual disturbance.  Respiratory: Negative for apnea, cough, shortness of breath and wheezing.   Cardiovascular: Negative for chest pain, palpitations and leg swelling.  Gastrointestinal: Negative for abdominal pain, constipation, diarrhea, nausea and vomiting.  Endocrine:       Well controlled hypothyroid  Genitourinary: Negative for dysuria and flank pain.  Musculoskeletal: Negative for arthralgias, back pain, gait problem and neck pain.  Skin: Negative for color change.  Allergic/Immunologic: Negative for environmental allergies and food allergies.  Neurological: Negative for dizziness and headaches.  Hematological: Does not bruise/bleed easily.  Psychiatric/Behavioral: Negative for agitation, behavioral problems (depression ), hallucinations and suicidal ideas. The patient is not nervous/anxious.     Today's Vitals   09/24/17 1158  BP: 124/65  Pulse: 65  Resp: 16  SpO2: 94%  Weight: 155 lb 9.6 oz (70.6 kg)   Height: 5\' 4"  (1.626 m)   Physical Exam  Constitutional: She is oriented to person, place, and time. She appears well-developed and well-nourished. No distress.  HENT:  Head: Normocephalic and atraumatic.  Nose: Nose normal.  Mouth/Throat: Oropharynx is clear and moist. No oropharyngeal exudate.  Eyes: Pupils are equal, round, and reactive to light. Conjunctivae and EOM are normal.  Neck: Normal range of motion. Neck supple. No JVD present. No tracheal deviation present. No thyromegaly present.  Cardiovascular: Normal rate, regular rhythm and normal heart sounds.  No murmur heard. Pulmonary/Chest: Effort normal and breath sounds normal. No respiratory distress. She has no wheezes. She exhibits no tenderness.  Abdominal: Soft. Bowel sounds are normal. There is no tenderness.  Musculoskeletal: Normal range of motion.  Lymphadenopathy:    She has no cervical  adenopathy.  Neurological: She is alert and oriented to person, place, and time. No cranial nerve deficit.  Skin: Skin is warm and dry. She is not diaphoretic.  Psychiatric: She has a normal mood and affect. Her behavior is normal. Judgment and thought content normal.  Nursing note and vitals reviewed.  Assessment/Plan: 1. Essential hypertension, benign Stable. Continue bp medication as prescribed  - losartan (COZAAR) 25 MG tablet; Take 1 tablet (25 mg total) by mouth 2 (two) times daily.  Dispense: 180 tablet; Refill: 4  2. Non-ulcerative nasal mucositis Use flonase nasal spray as needed and as prescribed.  - fluticasone (FLONASE) 50 MCG/ACT nasal spray; Place 2 sprays into both nostrils daily. (Patient not taking: Reported on 10/11/2017)  Dispense: 16 g; Refill: 5  3. Hyperpigmentation Retin-A cream may be applied to affected spots daily as needed.  - tretinoin (RETIN-A) 0.025 % cream; Apply topically as needed  Dispense: 20 g; Refill: 4  General Counseling: Amanda Livingston understanding of the findings of todays visit and  agrees with plan of treatment. I have discussed any further diagnostic evaluation that may be needed or ordered today. We also reviewed her medications today. she has been encouraged to call the office with any questions or concerns that should arise related to todays visit.    Counseling:  Hypertension Counseling:   The following hypertensive lifestyle modification were recommended and discussed:  1. Limiting alcohol intake to less than 1 oz/day of ethanol:(24 oz of beer or 8 oz of wine or 2 oz of 100-proof whiskey). 2. Take baby ASA 81 mg daily. 3. Importance of regular aerobic exercise and losing weight. 4. Reduce dietary saturated fat and cholesterol intake for overall cardiovascular health. 5. Maintaining adequate dietary potassium, calcium, and magnesium intake. 6. Regular monitoring of the blood pressure. 7. Reduce sodium intake to less than 100 mmol/day (less than 2.3 gm of sodium or less than 6 gm of sodium choride)   This patient was seen by Leretha Pol, FNP- C in Collaboration with Dr Lavera Guise as a part of collaborative care agreement  Meds ordered this encounter  Medications  . losartan (COZAAR) 25 MG tablet    Sig: Take 1 tablet (25 mg total) by mouth 2 (two) times daily.    Dispense:  180 tablet    Refill:  4    Order Specific Question:   Supervising Provider    Answer:   Lavera Guise [[id number]]  . tretinoin (RETIN-A) 0.025 % cream    Sig: Apply topically as needed    Dispense:  20 g    Refill:  4    Order Specific Question:   Supervising Provider    Answer:   Lavera Guise [[id number]]  . fluticasone (FLONASE) 50 MCG/ACT nasal spray    Sig: Place 2 sprays into both nostrils daily.    Dispense:  16 g    Refill:  5    Order Specific Question:   Supervising Provider    Answer:   Lavera Guise [[id number]]    Time spent: 55 Minutes      Dr Lavera Guise Internal medicine

## 2017-09-27 ENCOUNTER — Other Ambulatory Visit: Payer: Self-pay

## 2017-09-27 ENCOUNTER — Telehealth: Payer: Self-pay | Admitting: Gastroenterology

## 2017-09-27 DIAGNOSIS — K209 Esophagitis, unspecified without bleeding: Secondary | ICD-10-CM

## 2017-09-27 DIAGNOSIS — K219 Gastro-esophageal reflux disease without esophagitis: Secondary | ICD-10-CM

## 2017-09-27 NOTE — Telephone Encounter (Signed)
Patients colonoscopy has been moved to [facility] date changed to 11/01/17.  Kim in Endo has been notified.  Thanks [employer]

## 2017-09-27 NOTE — Telephone Encounter (Signed)
Patient stated that her insurance will not cover [facility] and needs to move her procedure to [facility]. Please call her later. She will be out for awhile.

## 2017-09-29 ENCOUNTER — Encounter: Payer: Self-pay | Admitting: Student

## 2017-09-29 DIAGNOSIS — L853 Xerosis cutis: Secondary | ICD-10-CM | POA: Diagnosis not present

## 2017-09-29 DIAGNOSIS — S90111A Contusion of right great toe without damage to nail, initial encounter: Secondary | ICD-10-CM | POA: Diagnosis not present

## 2017-09-29 DIAGNOSIS — K130 Diseases of lips: Secondary | ICD-10-CM | POA: Diagnosis not present

## 2017-09-29 DIAGNOSIS — D2362 Other benign neoplasm of skin of left upper limb, including shoulder: Secondary | ICD-10-CM | POA: Diagnosis not present

## 2017-09-29 DIAGNOSIS — L821 Other seborrheic keratosis: Secondary | ICD-10-CM | POA: Diagnosis not present

## 2017-09-29 DIAGNOSIS — L720 Epidermal cyst: Secondary | ICD-10-CM | POA: Diagnosis not present

## 2017-09-30 ENCOUNTER — Encounter: Payer: Self-pay | Admitting: *Deleted

## 2017-09-30 ENCOUNTER — Ambulatory Visit: Payer: Medicare HMO | Admitting: Certified Registered Nurse Anesthetist

## 2017-09-30 ENCOUNTER — Encounter
Admission: RE | Disposition: A | Discharge status: Home / Self Care | Payer: Self-pay | Source: Ambulatory Visit | Attending: Gastroenterology

## 2017-09-30 ENCOUNTER — Ambulatory Visit
Admission: RE | Admit: 2017-09-30 | Discharge: 2017-09-30 | Disposition: A | Discharge status: Home / Self Care | Payer: Medicare HMO | Source: Ambulatory Visit | Attending: Gastroenterology | Admitting: Gastroenterology

## 2017-09-30 DIAGNOSIS — R12 Heartburn: Secondary | ICD-10-CM

## 2017-09-30 DIAGNOSIS — K2100 Gastro-esophageal reflux disease with esophagitis, without bleeding: Secondary | ICD-10-CM

## 2017-09-30 DIAGNOSIS — E890 Postprocedural hypothyroidism: Secondary | ICD-10-CM | POA: Insufficient documentation

## 2017-09-30 DIAGNOSIS — Z79899 Other long term (current) drug therapy: Secondary | ICD-10-CM | POA: Insufficient documentation

## 2017-09-30 DIAGNOSIS — K209 Esophagitis, unspecified without bleeding: Secondary | ICD-10-CM

## 2017-09-30 DIAGNOSIS — Z85850 Personal history of malignant neoplasm of thyroid: Secondary | ICD-10-CM | POA: Diagnosis not present

## 2017-09-30 DIAGNOSIS — I10 Essential (primary) hypertension: Secondary | ICD-10-CM | POA: Diagnosis not present

## 2017-09-30 DIAGNOSIS — Z905 Acquired absence of kidney: Secondary | ICD-10-CM | POA: Diagnosis not present

## 2017-09-30 DIAGNOSIS — K2950 Unspecified chronic gastritis without bleeding: Secondary | ICD-10-CM | POA: Diagnosis not present

## 2017-09-30 DIAGNOSIS — Z87891 Personal history of nicotine dependence: Secondary | ICD-10-CM | POA: Diagnosis not present

## 2017-09-30 DIAGNOSIS — K219 Gastro-esophageal reflux disease without esophagitis: Secondary | ICD-10-CM

## 2017-09-30 DIAGNOSIS — K449 Diaphragmatic hernia without obstruction or gangrene: Secondary | ICD-10-CM | POA: Diagnosis not present

## 2017-09-30 DIAGNOSIS — K228 Other specified diseases of esophagus: Secondary | ICD-10-CM

## 2017-09-30 DIAGNOSIS — K3189 Other diseases of stomach and duodenum: Secondary | ICD-10-CM

## 2017-09-30 DIAGNOSIS — Z7951 Long term (current) use of inhaled steroids: Secondary | ICD-10-CM | POA: Diagnosis not present

## 2017-09-30 DIAGNOSIS — Z79890 Hormone replacement therapy (postmenopausal): Secondary | ICD-10-CM | POA: Insufficient documentation

## 2017-09-30 DIAGNOSIS — K2289 Other specified disease of esophagus: Secondary | ICD-10-CM

## 2017-09-30 DIAGNOSIS — Z09 Encounter for follow-up examination after completed treatment for conditions other than malignant neoplasm: Secondary | ICD-10-CM | POA: Diagnosis present

## 2017-09-30 HISTORY — PX: ESOPHAGOGASTRODUODENOSCOPY (EGD) WITH PROPOFOL: SHX5813

## 2017-09-30 SURGERY — ESOPHAGOGASTRODUODENOSCOPY (EGD) WITH PROPOFOL
Anesthesia: General

## 2017-09-30 MED ORDER — LIDOCAINE HCL (CARDIAC) PF 100 MG/5ML IV SOSY
PREFILLED_SYRINGE | INTRAVENOUS | Status: DC | PRN
  Administered 2017-09-30: 100 mg via INTRAVENOUS

## 2017-09-30 MED ORDER — SODIUM CHLORIDE 0.9 % IV SOLN: INTRAVENOUS | Status: DC

## 2017-09-30 MED ORDER — OMEPRAZOLE 20 MG PO CPDR: 20.0000 mg | DELAYED_RELEASE_CAPSULE | Freq: Every day | ORAL | 1 refills | Status: DC

## 2017-09-30 MED ORDER — LIDOCAINE HCL (PF) 1 % IJ SOLN
INTRAMUSCULAR | Status: AC
  Administered 2017-09-30: 0.4 mL
  Filled 2017-09-30: qty 2

## 2017-09-30 MED ORDER — PROPOFOL 10 MG/ML IV BOLUS
INTRAVENOUS | Status: DC | PRN
  Administered 2017-09-30: 20 mg via INTRAVENOUS
  Administered 2017-09-30: 50 mg via INTRAVENOUS

## 2017-09-30 MED ORDER — SODIUM CHLORIDE 0.9 % IV SOLN
INTRAVENOUS | Status: DC
  Administered 2017-09-30: 1000 mL via INTRAVENOUS

## 2017-09-30 MED ORDER — PROPOFOL 500 MG/50ML IV EMUL
INTRAVENOUS | Status: DC | PRN
  Administered 2017-09-30: 160 ug/kg/min via INTRAVENOUS

## 2017-09-30 MED ORDER — LIDOCAINE HCL (PF) 2 % IJ SOLN
INTRAMUSCULAR | Status: AC
  Filled 2017-09-30: qty 10

## 2017-09-30 MED ORDER — PROPOFOL 500 MG/50ML IV EMUL
INTRAVENOUS | Status: AC
  Filled 2017-09-30: qty 50

## 2017-09-30 NOTE — H&P (Signed)
[name], MD [address], [place], [place], [place], [address] [address], [place], [place], [place], [address] Phone: [phone]  Fax: [phone]  Primary Care Physician:  Lavera Guise, MD   Pre-Procedure History & Physical: HPI:  Amanda Livingston is a 72 y.o. female is here for an EGD.   Past Medical History:  Diagnosis Date  . Hypertension   . Osteopenia   . 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[place])    thyroid    Past Surgical History:  Procedure Laterality Date  . ABDOMINAL HYSTERECTOMY    . NEPHRECTOMY Left   . THYROIDECTOMY    . TONSILLECTOMY Right     Prior to Admission medications   Medication Sig Start Date End Date Taking? Authorizing Provider  Biotin 1 MG CAPS Take by mouth.   Yes [provider]  Biotin 1000 MCG tablet Take 1,000 mcg by mouth daily.   Yes [provider]  Calcium 500 MG CHEW Chew by mouth daily.   Yes [provider]  Cholecalciferol (VITAMIN D3) 1000 units CAPS Take by mouth.   Yes [provider]  cloNIDine (CATAPRES) 0.1 MG tablet Take 1 tablet (0.1 mg total) by mouth 2 (two) times daily as needed. Patient taking differently: Take by mouth as needed.  09/03/15  Yes Daymon Larsen, MD  estradiol (ESTRACE) 0.5 MG tablet Take 1 tablet (0.5 mg total) by mouth daily. 08/16/17  Yes Boscia, Heather E, NP  fluticasone (FLONASE) 50 MCG/ACT nasal spray Place 2 sprays into both nostrils daily. 09/24/17  Yes Ronnell Freshwater, NP  levothyroxine (SYNTHROID) 137 MCG tablet Take by mouth. 12/30/15  Yes [provider]  losartan (COZAAR) 25 MG tablet Take 1 tablet (25 mg total) by mouth 2 (two) times daily. 09/24/17  Yes Boscia, Greer Ee, NP  metoprolol succinate (TOPROL-XL) 25 MG 24 hr tablet Take 25 mg by mouth daily. take 1/2 tablet   Yes [provider]  Omega-3 Fatty Acids (FISH OIL) 1000 MG CAPS Take by mouth daily.   Yes [provider]  ranitidine (ZANTAC 75) 75 MG tablet Take 1 tablet (75  mg total) by mouth 2 (two) times daily. 09/16/17  Yes [name] B, MD  tretinoin (RETIN-A) 0.025 % cream Apply topically as needed 09/24/17  Yes Boscia, Heather E, NP  vitamin C (ASCORBIC ACID) 500 MG tablet Take 500 mg by mouth daily.   Yes [provider]    Allergies as of 09/27/2017 - Review Complete 09/24/2017  Allergen Reaction Noted  . Nitrofurantoin Rash 10/05/2014    Family History  Problem Relation Age of Onset  . Cancer Father     Social History   Socioeconomic History  . Marital status: Divorced    Spouse name: Not on file  . Number of children: Not on file  . Years of education: Not on file  . Highest education level: Not on file  Occupational History  . Not on file  Social Needs  . Financial resource strain: Not on file  . Food insecurity:    Worry: Not on file    Inability: Not on file  . Transportation needs:    Medical: Not on file    Non-medical: Not on file  Tobacco Use  . Smoking status: Former Smoker    Last attempt to quit: 2004    Years since quitting: 15.4  . Smokeless tobacco: Never Used  Substance and Sexual Activity  . Alcohol use: No  . Drug use: No  .  Sexual activity: Not on file  Lifestyle  . Physical activity:    Days per week: Not on file    Minutes per session: Not on file  . Stress: Not on file  Relationships  . Social connections:    Talks on phone: Not on file    Gets together: Not on file    Attends religious service: Not on file    Active member of club or organization: Not on file    Attends meetings of clubs or organizations: Not on file    Relationship status: Not on file  . Intimate partner violence:    Fear of current or ex partner: Not on file    Emotionally abused: Not on file    Physically abused: Not on file    Forced sexual activity: Not on file  Other Topics Concern  . Not on file  Social History Narrative  . Not on file    Review of Systems: See HPI, otherwise negative ROS  Physical  Exam: BP (!) 144/74   Pulse 66   Temp (!) 96.6 F (35.9 C) (Tympanic)   SpO2 100%  General:   Alert,  pleasant and cooperative in NAD Head:  Normocephalic and atraumatic. Neck:  Supple; no masses or thyromegaly. Lungs:  Clear throughout to auscultation, normal respiratory effort.    Heart:  +S1, +S2, Regular rate and rhythm, No edema. Abdomen:  Soft, nontender and nondistended. Normal bowel sounds, without guarding, and without rebound.   Neurologic:  Alert and  oriented x4;  grossly normal neurologically.  Impression/Plan: Amanda Livingston is here for an EGD for Acid Reflux.  Risks, benefits, limitations, and alternatives regarding the procedure have been reviewed with the patient.  Questions have been answered.  All parties agreeable.   Virgel Manifold, MD  09/30/2017, 9:28 AM

## 2017-09-30 NOTE — Op Note (Signed)
[facility] Gastroenterology Patient Name: Amanda Livingston Procedure Date: 09/30/2017 9:39 AM MRN: [id number] Account #: [id number] Date of Birth: 1945-12-27 Admit Type: Outpatient Age: 72 Room: [facility] ENDO ROOM 4 Gender: Female Note Status: Finalized Procedure:            Upper GI endoscopy Indications:          Heartburn, Follow-up of esophagitis Providers:            Sherrilynn Gudgel B. Bonna Gains MD, MD Referring MD:         Lavera Guise, MD (Referring MD) Medicines:            Monitored Anesthesia Care Complications:        No immediate complications. Procedure:            Pre-Anesthesia Assessment:                       - Prior to the procedure, a History and Physical was                        performed, and patient medications, allergies and                        sensitivities were reviewed. The patient's tolerance of                        previous anesthesia was reviewed.                       - The risks and benefits of the procedure and the                        sedation options and risks were discussed with the                        patient. All questions were answered and informed                        consent was obtained.                       - Patient identification and proposed procedure were                        verified prior to the procedure by the physician, the                        nurse, the anesthesiologist, the anesthetist and the                        technician. The procedure was verified in the procedure                        room.                       - ASA Grade Assessment: III - A patient with severe                        systemic disease.  After obtaining informed consent, the endoscope was                        passed under direct vision. Throughout the procedure,                        the patient's blood pressure, pulse, and oxygen                        saturations were monitored continuously. The  Endoscope                        was introduced through the mouth, and advanced to the                        third part of duodenum. The upper GI endoscopy was                        accomplished with ease. The patient tolerated the                        procedure well. Findings:      LA Grade A (one or more mucosal breaks less than 5 mm, not extending       between tops of 2 mucosal folds) esophagitis with no bleeding was found       38 cm from the incisors.      The Z-line was irregular and was found 38 cm from the incisors. Biopsies       were taken with a cold forceps for histology.      Patchy mildly erythematous mucosa without bleeding was found in the       gastric antrum. Biopsies were taken with a cold forceps for histology.       Biopsies were obtained in the gastric body, at the incisura and in the       gastric antrum with cold forceps for histology.      A 1 cm hiatal hernia was present.      The duodenal bulb, second portion of the duodenum and examined duodenum       were normal. Impression:           - LA Grade A reflux esophagitis.                       - Z-line irregular, 38 cm from the incisors. Biopsied.                       - Erythematous mucosa in the antrum. Biopsied.                       - 1 cm hiatal hernia.                       - Normal duodenal bulb, second portion of the duodenum                        and examined duodenum.                       - Biopsies were obtained in the gastric body, at the  incisura and in the gastric antrum. Recommendation:       - Await pathology results.                       - Follow an antireflux regimen.                       - Discharge patient to home (with escort).                       - Advance diet as tolerated.                       - Continue present medications.                       - Patient has a contact number available for                        emergencies. The signs and symptoms of  potential                        delayed complications were discussed with the patient.                        Return to normal activities tomorrow. Written discharge                        instructions were provided to the patient.                       - Discharge patient to home (with escort).                       - The findings and recommendations were discussed with                        the patient.                       - The findings and recommendations were discussed with                        the patient's family.                       - Take prescribed proton pump inhibitor or H2 blocker                        (antacid) medications 30 - 60 minutes before meals. Procedure Code(s):    --- Professional ---                       [id number], Esophagogastroduodenoscopy, flexible, transoral;                        with biopsy, single or multiple Diagnosis Code(s):    --- Professional ---                       K21.0, Gastro-esophageal reflux disease with esophagitis                       K22.8, Other  specified diseases of esophagus                       K31.89, Other diseases of stomach and duodenum                       K44.9, Diaphragmatic hernia without obstruction or                        gangrene                       R12, Heartburn CPT copyright 2017 American Medical Association. All rights reserved. The codes documented in this report are preliminary and upon coder review may  be revised to meet current compliance requirements.  [name], MD Margretta Sidle B. Bonna Gains MD, MD 09/30/2017 10:13:08 AM This report has been signed electronically. Number of Addenda: 0 Note Initiated On: 09/30/2017 9:39 AM Estimated Blood Loss: Estimated blood loss: none.      [facility]

## 2017-09-30 NOTE — Anesthesia Preprocedure Evaluation (Addendum)
Anesthesia Evaluation  Patient identified by MRN, date of birth, ID band Patient awake    Reviewed: Allergy & Precautions, H&P , NPO status , reviewed documented beta blocker date and time   Airway Mallampati: II  TM Distance: <3 FB Neck ROM: full    Dental  (+) Caps   Pulmonary former smoker,    Pulmonary exam normal        Cardiovascular hypertension, Normal cardiovascular exam     Neuro/Psych  Headaches,    GI/Hepatic GERD  Medicated,  Endo/Other  Hypothyroidism   Renal/GU      Musculoskeletal   Abdominal   Peds  Hematology   Anesthesia Other Findings Past Medical History: No date: Hypertension No date: Osteopenia No date: 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[place])     Comment:  thyroid  Past Surgical History: No date: ABDOMINAL HYSTERECTOMY No date: NEPHRECTOMY; Left No date: THYROIDECTOMY No date: TONSILLECTOMY; Right     Reproductive/Obstetrics                            Anesthesia Physical Anesthesia Plan  ASA: II  Anesthesia Plan: General   Post-op Pain Management:    Induction:   PONV Risk Score and Plan: 3 and Treatment may vary due to age or medical condition and TIVA  Airway Management Planned:   Additional Equipment:   Intra-op Plan:   Post-operative Plan:   Informed Consent: I have reviewed the patients History and Physical, chart, labs and discussed the procedure including the risks, benefits and alternatives for the proposed anesthesia with the patient or authorized representative who has indicated his/her understanding and acceptance.   Dental Advisory Given  Plan Discussed with: CRNA  Anesthesia Plan Comments:        Anesthesia Quick Evaluation

## 2017-09-30 NOTE — Transfer of Care (Signed)
Immediate Anesthesia Transfer of Care Note  Patient: Amanda Livingston  Procedure(s) Performed: ESOPHAGOGASTRODUODENOSCOPY (EGD) WITH PROPOFOL (N/A )  Patient Location: PACU  Anesthesia Type:General  Level of Consciousness: awake, alert  and oriented  Airway & Oxygen Therapy: Patient Spontanous Breathing and Patient connected to nasal cannula oxygen  Post-op Assessment: Report given to RN and Post -op Vital signs reviewed and stable  Post vital signs: Reviewed and stable  Last Vitals:  Vitals Value Taken Time  BP 120/77 09/30/2017 10:38 AM  Temp    Pulse 53 09/30/2017 10:46 AM  Resp 21 09/30/2017 10:46 AM  SpO2 100 % 09/30/2017 10:46 AM  Vitals shown include unvalidated device data.  Last Pain:  Vitals:   09/30/17 1030  TempSrc:   PainSc: 0-No pain      Patients Stated Pain Goal: 0 (99/77/41 4239)  Complications: No apparent anesthesia complications

## 2017-09-30 NOTE — Anesthesia Post-op Follow-up Note (Signed)
Anesthesia QCDR form completed.        

## 2017-09-30 NOTE — Anesthesia Procedure Notes (Signed)
Performed by: Brytney Somes, CRNA Pre-anesthesia Checklist: Patient identified, Emergency Drugs available, Suction available, Timeout performed and Patient being monitored Patient Re-evaluated:Patient Re-evaluated prior to induction Oxygen Delivery Method: Nasal cannula Induction Type: IV induction       

## 2017-10-01 ENCOUNTER — Encounter: Payer: Self-pay | Admitting: Gastroenterology

## 2017-10-01 LAB — SURGICAL PATHOLOGY

## 2017-10-03 ENCOUNTER — Other Ambulatory Visit: Payer: Self-pay | Admitting: Gastroenterology

## 2017-10-05 MED ORDER — OMEPRAZOLE 20 MG PO CPDR: 20.0000 mg | DELAYED_RELEASE_CAPSULE | Freq: Every day | ORAL | 1 refills | Status: DC

## 2017-10-05 NOTE — Anesthesia Postprocedure Evaluation (Signed)
Anesthesia Post Note  Patient: KRISTIANA JACKO  Procedure(s) Performed: ESOPHAGOGASTRODUODENOSCOPY (EGD) WITH PROPOFOL (N/A )  Patient location during evaluation: Endoscopy Anesthesia Type: General Level of consciousness: awake and alert Pain management: pain level controlled Vital Signs Assessment: post-procedure vital signs reviewed and stable Respiratory status: spontaneous breathing, nonlabored ventilation and respiratory function stable Cardiovascular status: blood pressure returned to baseline and stable Postop Assessment: no apparent nausea or vomiting Anesthetic complications: no     Last Vitals:  Vitals:   09/30/17 1020 09/30/17 1030  BP: 109/67 113/69  Pulse: (!) 57 (!) 49  Resp: 19 18  Temp:    SpO2: 100% 100%    Last Pain:  Vitals:   09/30/17 1008  TempSrc: Tympanic                 Raykwon Hobbs Harvie Heck

## 2017-10-05 NOTE — Addendum Note (Signed)
Addended by: Earl Lagos on: 10/05/2017 12:14 PM   Modules accepted: Orders

## 2017-10-07 ENCOUNTER — Encounter: Payer: Self-pay | Admitting: Gastroenterology

## 2017-10-11 ENCOUNTER — Encounter: Payer: Self-pay | Admitting: Gastroenterology

## 2017-10-11 ENCOUNTER — Ambulatory Visit: Payer: Medicare HMO | Admitting: Gastroenterology

## 2017-10-11 ENCOUNTER — Other Ambulatory Visit: Payer: Self-pay

## 2017-10-11 VITALS — BP 117/73 | HR 60 | Temp 97.5°F | Ht 64.0 in | Wt 154.2 lb

## 2017-10-11 DIAGNOSIS — K208 Other esophagitis without bleeding: Secondary | ICD-10-CM

## 2017-10-11 NOTE — Progress Notes (Signed)
[name], MD [address]  [place]  [place], [place] [address]  Main: [phone]  Fax: [phone]   Primary Care Physician: Lavera Guise, MD  Primary Gastroenterologist:  Dr. [name]  Chief Complaint  Patient presents with  . Follow-up    discuss medication need-Prilosce    HPI: Amanda Livingston is a 72 y.o. female here for follow-up of grade a esophagitis found on EGD.  Patient is not taking her Prilosec as prescribed.  Take Zantac at bedtime.  Denies any dysphasia.  Denies any abdominal pain.  No weight loss.  Her concern with Prilosec, is that she has one kidney, and does not want to cause kidney damage as discussed on adverse effects on previous visits.  However, she was always taking Zantac once or twice daily prior to the EGD that showed the grade a esophagitis, and was thus not relieving the grade a esophagitis.  She states, she discussed things with her primary care physician, and urologist, and they both recommended using the PPI for shortest duration possible.  Current Outpatient Medications  Medication Sig Dispense Refill  . aspirin EC 81 MG tablet Take 81 mg by mouth 3 (three) times a week.    . Biotin 1000 MCG tablet Take 1,000 mcg by mouth daily.    . Calcium 500 MG CHEW Chew by mouth daily.    . Cholecalciferol (VITAMIN D3) 1000 units CAPS Take by mouth.    . cloNIDine (CATAPRES) 0.1 MG tablet Take 1 tablet (0.1 mg total) by mouth 2 (two) times daily as needed. (Patient taking differently: Take by mouth as needed. ) 30 tablet 0  . estradiol (ESTRACE) 0.5 MG tablet Take 1 tablet (0.5 mg total) by mouth daily. 90 tablet 3  . levothyroxine (SYNTHROID) 137 MCG tablet Take by mouth.    . losartan (COZAAR) 25 MG tablet Take 1 tablet (25 mg total) by mouth 2 (two) times daily. 180 tablet 4  . metoprolol succinate (TOPROL-XL) 25 MG 24 hr tablet Take 25 mg by mouth daily. take 1/2 tablet    . Omega-3 Fatty Acids (FISH OIL) 1000 MG CAPS Take by  mouth daily.    . ranitidine (ZANTAC) 150 MG tablet Take 150 mg by mouth at bedtime.    . tretinoin (RETIN-A) 0.025 % cream Apply topically as needed 20 g 4  . vitamin C (ASCORBIC ACID) 500 MG tablet Take 500 mg by mouth daily.    . fluticasone (FLONASE) 50 MCG/ACT nasal spray Place 2 sprays into both nostrils daily. (Patient not taking: Reported on 10/11/2017) 16 g 5  . omeprazole (PRILOSEC) 20 MG capsule Take 1 capsule (20 mg total) by mouth daily. (Patient not taking: Reported on 10/11/2017) 90 capsule 1   No current facility-administered medications for this visit.     Allergies as of 10/11/2017 - Review Complete 09/30/2017  Allergen Reaction Noted  . Nitrofurantoin Rash 10/05/2014    ROS:  General: Negative for anorexia, weight loss, fever, chills, fatigue, weakness. ENT: Negative for hoarseness, difficulty swallowing , nasal congestion. CV: Negative for chest pain, angina, palpitations, dyspnea on exertion, peripheral edema.  Respiratory: Negative for dyspnea at rest, dyspnea on exertion, cough, sputum, wheezing.  GI: See history of present illness. GU:  Negative for dysuria, hematuria, urinary incontinence, urinary frequency, nocturnal urination.  Endo: Negative for unusual weight change.    Physical Examination:   BP 117/73   Pulse 60   Temp (!) 97.5 F (36.4 C) (Oral)   Ht  5\' 4"  (1.626 m)   Wt 154 lb 3.2 oz (69.9 kg)   BMI 26.47 kg/m   General: Well-nourished, well-developed in no acute distress.  Eyes: No icterus. Conjunctivae pink. Mouth: Oropharyngeal mucosa moist and pink , no lesions erythema or exudate. Neck: Supple, Trachea midline Abdomen: Bowel sounds are normal, nontender, nondistended, no hepatosplenomegaly or masses, no abdominal bruits or hernia , no rebound or guarding.   Extremities: No lower extremity edema. No clubbing or deformities. Neuro: Alert and oriented x 3.  Grossly intact. Skin: Warm and dry, no jaundice.   Psych: Alert and cooperative,  normal mood and affect.   Labs: CMP     Component Value Date/Time   NA 139 09/03/2015 0758   K 4.8 09/03/2015 0758   CL 106 09/03/2015 0758   CO2 26 09/03/2015 0758   GLUCOSE 109 (H) 09/03/2015 0758   BUN 13 09/03/2015 0758   CREATININE 0.70 09/03/2015 0758   CALCIUM 9.3 09/03/2015 0758   GFRNONAA >60 09/03/2015 0758   GFRAA >60 09/03/2015 0758   Lab Results  Component Value Date   WBC 7.6 09/03/2015   HGB 13.1 09/03/2015   HCT 39.3 09/03/2015   MCV 83.8 09/03/2015   PLT 213 09/03/2015    Imaging Studies: No results found.  Assessment and Plan:   Amanda Livingston is a 72 y.o. y/o female with grade a esophagitis  She has not started her Prilosec I have asked her to take omeprazole 20 mg daily for 2 weeks and then stop completely if no further symptoms If further symptoms occur, she was asked to call us before resuming the medication Since this is for a very short duration, and she has grade a esophagitis unrelieved with Zantac, benefit of medication, just for 2 weeks outweigh risks at this time. This was discussed extensively with her and she is willing to try the medication for 2 weeks If symptoms recur in the future, Zantac twice daily, along with lifestyle measures can be discussed at that time as well  Patient educated extensively on acid reflux lifestyle modification, including buying a bed wedge, not eating 3 hrs before bedtime, diet modifications, and handout given for the same.   "Last colonoscopy, February 2015 for screening by Dr. Allen Norris with a 5 mm descending colon polyp removed, and internal hemorrhoids.  Biopsy showed hyperplastic polyp.  Repeat was recommended in 10 years if biopsies show hyperplastic polyp."   Dr [name]

## 2017-10-14 ENCOUNTER — Ambulatory Visit: Payer: Self-pay | Admitting: Nurse Practitioner

## 2017-10-18 DIAGNOSIS — J3481 Nasal mucositis (ulcerative): Secondary | ICD-10-CM | POA: Insufficient documentation

## 2017-10-18 DIAGNOSIS — J3489 Other specified disorders of nose and nasal sinuses: Secondary | ICD-10-CM | POA: Insufficient documentation

## 2017-10-18 DIAGNOSIS — L819 Disorder of pigmentation, unspecified: Secondary | ICD-10-CM | POA: Insufficient documentation

## 2017-11-02 ENCOUNTER — Encounter: Admission: RE | Payer: Self-pay | Source: Ambulatory Visit

## 2017-11-02 ENCOUNTER — Ambulatory Visit: Admission: RE | Admit: 2017-11-02 | Payer: Medicare HMO | Source: Ambulatory Visit | Admitting: Gastroenterology

## 2017-11-02 SURGERY — ESOPHAGOGASTRODUODENOSCOPY (EGD) WITH PROPOFOL
Anesthesia: Choice

## 2017-11-10 DIAGNOSIS — Z0100 Encounter for examination of eyes and vision without abnormal findings: Secondary | ICD-10-CM | POA: Diagnosis not present

## 2017-11-23 ENCOUNTER — Ambulatory Visit: Payer: Self-pay | Admitting: Nurse Practitioner

## 2017-11-23 DIAGNOSIS — I4891 Unspecified atrial fibrillation: Secondary | ICD-10-CM | POA: Diagnosis not present

## 2017-11-23 DIAGNOSIS — I493 Ventricular premature depolarization: Secondary | ICD-10-CM | POA: Diagnosis not present

## 2017-11-23 DIAGNOSIS — I10 Essential (primary) hypertension: Secondary | ICD-10-CM | POA: Diagnosis not present

## 2017-11-23 DIAGNOSIS — R002 Palpitations: Secondary | ICD-10-CM | POA: Diagnosis not present

## 2017-11-23 DIAGNOSIS — I519 Heart disease, unspecified: Secondary | ICD-10-CM | POA: Diagnosis not present

## 2017-11-26 DIAGNOSIS — I491 Atrial premature depolarization: Secondary | ICD-10-CM | POA: Diagnosis not present

## 2017-12-02 DIAGNOSIS — I519 Heart disease, unspecified: Secondary | ICD-10-CM | POA: Diagnosis not present

## 2017-12-02 DIAGNOSIS — I491 Atrial premature depolarization: Secondary | ICD-10-CM | POA: Insufficient documentation

## 2017-12-02 DIAGNOSIS — I10 Essential (primary) hypertension: Secondary | ICD-10-CM | POA: Diagnosis not present

## 2017-12-02 DIAGNOSIS — R002 Palpitations: Secondary | ICD-10-CM | POA: Diagnosis not present

## 2017-12-02 DIAGNOSIS — I493 Ventricular premature depolarization: Secondary | ICD-10-CM | POA: Diagnosis not present

## 2017-12-14 ENCOUNTER — Encounter: Payer: Self-pay | Admitting: Nurse Practitioner

## 2017-12-14 ENCOUNTER — Ambulatory Visit (INDEPENDENT_AMBULATORY_CARE_PROVIDER_SITE_OTHER): Payer: Medicare HMO | Admitting: Nurse Practitioner

## 2017-12-14 VITALS — BP 128/64 | HR 72 | Resp 16 | Ht 64.0 in | Wt 158.6 lb

## 2017-12-14 DIAGNOSIS — N390 Urinary tract infection, site not specified: Secondary | ICD-10-CM | POA: Diagnosis not present

## 2017-12-14 DIAGNOSIS — R300 Dysuria: Secondary | ICD-10-CM

## 2017-12-14 LAB — POCT URINALYSIS DIPSTICK
Bilirubin, UA: 0.2
Blood, UA: NEGATIVE
Glucose, UA: NEGATIVE
KETONES UA: NEGATIVE
Leukocytes, UA: NEGATIVE
NITRITE UA: NEGATIVE
PROTEIN UA: NEGATIVE
Spec Grav, UA: 1.01 (ref 1.010–1.025)
Urobilinogen, UA: 0.2 E.U./dL
pH, UA: 5 (ref 5.0–8.0)

## 2017-12-14 MED ORDER — PHENAZOPYRIDINE HCL 200 MG PO TABS: 200.0000 mg | ORAL_TABLET | Freq: Three times a day (TID) | ORAL | 0 refills | Status: DC | PRN

## 2017-12-14 MED ORDER — CIPROFLOXACIN HCL 500 MG PO TABS: 500.0000 mg | ORAL_TABLET | Freq: Two times a day (BID) | ORAL | 2 refills | Status: DC

## 2017-12-14 MED ORDER — CIPROFLOXACIN HCL 500 MG PO TABS
500.0000 mg | ORAL_TABLET | Freq: Two times a day (BID) | ORAL | 0 refills | Status: DC
Start: 2017-12-14 — End: 2017-12-14

## 2017-12-14 NOTE — Progress Notes (Signed)
[facility] [place], [place] [address]  Internal MEDICINE  Office Visit Note  Patient Name: Amanda Livingston  [id number]  [id number]  Date of Service: 12/14/2017   Pt is here for a sick visit.  Chief Complaint  Patient presents with  . Urinary Tract Infection    frequent urination for a couple of days, feels like baldder isnt emptying all the way. no itching or burning     Urinary Tract Infection   This is a new problem. The current episode started yesterday. The problem occurs every urination. The problem has been unchanged. The quality of the pain is described as burning. There has been no fever. She is not sexually active. There is no history of pyelonephritis. Associated symptoms include frequency and urgency. Pertinent negatives include no chills, flank pain, nausea or vomiting. She has tried home medications for the symptoms. The treatment provided no relief. Her past medical history is significant for recurrent UTIs.        Current Medication:  Outpatient Encounter Medications as of 12/14/2017  Medication Sig Note  . aspirin EC 81 MG tablet Take 81 mg by mouth 3 (three) times a week.   . Biotin 1000 MCG tablet Take 1,000 mcg by mouth daily.   . Calcium 500 MG CHEW Chew by mouth daily.   . Cholecalciferol (VITAMIN D3) 1000 units CAPS Take by mouth.   . cloNIDine (CATAPRES) 0.1 MG tablet Take 1 tablet (0.1 mg total) by mouth 2 (two) times daily as needed. (Patient taking differently: Take by mouth as needed. )   . estradiol (ESTRACE) 0.5 MG tablet Take 1 tablet (0.5 mg total) by mouth daily. 09/16/2017: Every other day per pt  . levothyroxine (SYNTHROID) 137 MCG tablet Take by mouth.   . losartan (COZAAR) 25 MG tablet Take 1 tablet (25 mg total) by mouth 2 (two) times daily.   . metoprolol succinate (TOPROL-XL) 25 MG 24 hr tablet Take 25 mg by mouth daily. take 1/2 tablet 09/16/2017: Taking 1/2 tab. Every day  . Omega-3 Fatty Acids (FISH OIL) 1000 MG  CAPS Take by mouth daily.   . ranitidine (ZANTAC) 150 MG tablet Take 150 mg by mouth at bedtime.   . tretinoin (RETIN-A) 0.025 % cream Apply topically as needed   . vitamin C (ASCORBIC ACID) 500 MG tablet Take 500 mg by mouth daily.   . ciprofloxacin (CIPRO) 500 MG tablet Take 1 tablet (500 mg total) by mouth 2 (two) times daily.   . fluticasone (FLONASE) 50 MCG/ACT nasal spray Place 2 sprays into both nostrils daily. (Patient not taking: Reported on 10/11/2017)   . omeprazole (PRILOSEC) 20 MG capsule Take 1 capsule (20 mg total) by mouth daily. (Patient not taking: Reported on 10/11/2017)   . phenazopyridine (PYRIDIUM) 200 MG tablet Take 1 tablet (200 mg total) by mouth 3 (three) times daily as needed for pain.   . [DISCONTINUED] ciprofloxacin (CIPRO) 500 MG tablet Take 1 tablet (500 mg total) by mouth 2 (two) times daily.    No facility-administered encounter medications on file as of 12/14/2017.       Medical History: Past Medical History:  Diagnosis Date  . Hypertension   . Osteopenia   . 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[place])    thyroid     Today's Vitals   12/14/17 1351  BP: 128/64  Pulse: 72  Resp: 16  SpO2: 95%  Weight: 158 lb 9.6 oz (71.9 kg)  Height: 5\' 4"  (1.626 m)  Review of Systems  Constitutional: Negative for activity change, chills, diaphoresis, fatigue and unexpected weight change.  HENT: Negative for ear pain, postnasal drip and sinus pressure.   Eyes: Negative for photophobia, discharge, redness, itching and visual disturbance.  Respiratory: Negative for apnea, cough, shortness of breath and wheezing.   Cardiovascular: Negative for chest pain, palpitations and leg swelling.  Gastrointestinal: Negative for abdominal pain, constipation, diarrhea, nausea and vomiting.  Endocrine:       Well controlled hypothyroid  Genitourinary: Positive for frequency and urgency. Negative for dysuria and flank pain.  Musculoskeletal: Negative for arthralgias, back pain, gait problem and neck  pain.  Skin: Negative for color change.  Allergic/Immunologic: Negative for environmental allergies and food allergies.  Neurological: Negative for dizziness and headaches.  Hematological: Does not bruise/bleed easily.  Psychiatric/Behavioral: Negative for agitation, behavioral problems (depression ), hallucinations and suicidal ideas. The patient is not nervous/anxious.     Physical Exam  Constitutional: She is oriented to person, place, and time. She appears well-developed and well-nourished. No distress.  HENT:  Head: Normocephalic and atraumatic.  Mouth/Throat: Oropharynx is clear and moist. No oropharyngeal exudate.  Eyes: Pupils are equal, round, and reactive to light. Conjunctivae and EOM are normal.  Neck: Normal range of motion. Neck supple. No JVD present. No tracheal deviation present. No thyromegaly present.  Cardiovascular: Normal rate, regular rhythm and normal heart sounds.  No murmur heard. Pulmonary/Chest: Effort normal and breath sounds normal. No respiratory distress. She has no wheezes. She exhibits no tenderness.  Abdominal: Soft. Bowel sounds are normal. There is no tenderness.  Genitourinary:  Genitourinary Comments: Urine sample negative for abnormality today.   Musculoskeletal: Normal range of motion.  Lymphadenopathy:    She has no cervical adenopathy.  Neurological: She is alert and oriented to person, place, and time. No cranial nerve deficit.  Skin: Skin is warm and dry. She is not diaphoretic.  Psychiatric: She has a normal mood and affect. Her behavior is normal. Judgment and thought content normal.  Nursing note and vitals reviewed.  Assessment/Plan:   1. Urinary tract infection without hematuria, site unspecified wil treat with cipro 500mg  twice daily for 5 days. Will adjust treatment as indicated based on culture results.  - ciprofloxacin (CIPRO) 500 MG tablet; Take 1 tablet (500 mg total) by mouth 2 (two) times daily.  Dispense: 10 tablet; Refill:  2  2. Dysuria - POCT Urinalysis Dipstick - phenazopyridine (PYRIDIUM) 200 MG tablet; Take 1 tablet (200 mg total) by mouth 3 (three) times daily as needed for pain.  Dispense: 10 tablet; Refill: 0  General Counseling: kalii chesmore understanding of the findings of todays visit and agrees with plan of treatment. I have discussed any further diagnostic evaluation that may be needed or ordered today. We also reviewed her medications today. she has been encouraged to call the office with any questions or concerns that should arise related to todays visit.    Counseling:  This patient was seen by Leretha Pol FNP Collaboration with Dr Lavera Guise as a part of collaborative care agreement  Orders Placed This Encounter  Procedures  . CULTURE, URINE COMPREHENSIVE  . POCT Urinalysis Dipstick    Meds ordered this encounter  Medications  . DISCONTD: ciprofloxacin (CIPRO) 500 MG tablet    Sig: Take 1 tablet (500 mg total) by mouth 2 (two) times daily.    Dispense:  10 tablet    Refill:  0    Order Specific Question:   Supervising Provider  Answer:   Lavera [place]  . phenazopyridine (PYRIDIUM) 200 MG tablet    Sig: Take 1 tablet (200 mg total) by mouth 3 (three) times daily as needed for pain.    Dispense:  10 tablet    Refill:  0    Order Specific Question:   Supervising Provider    Answer:   Lavera Guise [[id number]]  . ciprofloxacin (CIPRO) 500 MG tablet    Sig: Take 1 tablet (500 mg total) by mouth 2 (two) times daily.    Dispense:  10 tablet    Refill:  2    Please add 2 refills to prescription. Thanks.    Order Specific Question:   Supervising Provider    Answer:   Lavera Guise [[id number]]    Time spent: 15 Minutes

## 2017-12-17 LAB — CULTURE, URINE COMPREHENSIVE

## 2017-12-20 ENCOUNTER — Ambulatory Visit: Payer: Medicare HMO | Admitting: Gastroenterology

## 2017-12-21 ENCOUNTER — Telehealth: Payer: Self-pay

## 2017-12-21 ENCOUNTER — Other Ambulatory Visit: Payer: Self-pay | Admitting: Nurse Practitioner

## 2017-12-21 DIAGNOSIS — N390 Urinary tract infection, site not specified: Secondary | ICD-10-CM

## 2017-12-21 MED ORDER — CIPROFLOXACIN HCL 500 MG PO TABS
500.0000 mg | ORAL_TABLET | Freq: Two times a day (BID) | ORAL | 2 refills | Status: DC
Start: 2017-12-21 — End: 2018-07-15

## 2017-12-21 NOTE — Progress Notes (Signed)
Patient still having uti symptoms after 5 days cipro. Will continue for additional 10 days. New rx sent to her pharmacy.

## 2017-12-21 NOTE — Telephone Encounter (Signed)
Patient still having uti symptoms after 5 days cipro. Will continue for additional 10 days. New rx sent to her pharmacy.

## 2017-12-21 NOTE — Telephone Encounter (Signed)
Pt was notified.  

## 2018-01-17 DIAGNOSIS — E890 Postprocedural hypothyroidism: Secondary | ICD-10-CM | POA: Diagnosis not present

## 2018-02-02 DIAGNOSIS — I10 Essential (primary) hypertension: Secondary | ICD-10-CM | POA: Diagnosis not present

## 2018-02-02 DIAGNOSIS — R002 Palpitations: Secondary | ICD-10-CM | POA: Diagnosis not present

## 2018-02-02 DIAGNOSIS — I493 Ventricular premature depolarization: Secondary | ICD-10-CM | POA: Diagnosis not present

## 2018-02-02 DIAGNOSIS — I519 Heart disease, unspecified: Secondary | ICD-10-CM | POA: Diagnosis not present

## 2018-02-02 DIAGNOSIS — I499 Cardiac arrhythmia, unspecified: Secondary | ICD-10-CM | POA: Diagnosis not present

## 2018-02-02 DIAGNOSIS — I491 Atrial premature depolarization: Secondary | ICD-10-CM | POA: Diagnosis not present

## 2018-02-07 DIAGNOSIS — R69 Illness, unspecified: Secondary | ICD-10-CM | POA: Diagnosis not present

## 2018-02-16 DIAGNOSIS — I519 Heart disease, unspecified: Secondary | ICD-10-CM | POA: Diagnosis not present

## 2018-02-16 DIAGNOSIS — I340 Nonrheumatic mitral (valve) insufficiency: Secondary | ICD-10-CM | POA: Diagnosis not present

## 2018-02-16 DIAGNOSIS — R002 Palpitations: Secondary | ICD-10-CM | POA: Diagnosis not present

## 2018-02-21 DIAGNOSIS — I491 Atrial premature depolarization: Secondary | ICD-10-CM | POA: Diagnosis not present

## 2018-02-24 ENCOUNTER — Telehealth: Payer: Self-pay | Admitting: Internal Medicine

## 2018-02-24 NOTE — Telephone Encounter (Signed)
MAILED MEDICAL RECORDS TO [id number] [place], [place] [address].JW

## 2018-03-02 DIAGNOSIS — I340 Nonrheumatic mitral (valve) insufficiency: Secondary | ICD-10-CM | POA: Diagnosis not present

## 2018-03-02 DIAGNOSIS — R002 Palpitations: Secondary | ICD-10-CM | POA: Diagnosis not present

## 2018-03-07 DIAGNOSIS — I10 Essential (primary) hypertension: Secondary | ICD-10-CM | POA: Diagnosis not present

## 2018-03-07 DIAGNOSIS — I491 Atrial premature depolarization: Secondary | ICD-10-CM | POA: Diagnosis not present

## 2018-03-07 DIAGNOSIS — I493 Ventricular premature depolarization: Secondary | ICD-10-CM | POA: Diagnosis not present

## 2018-03-07 DIAGNOSIS — I340 Nonrheumatic mitral (valve) insufficiency: Secondary | ICD-10-CM | POA: Insufficient documentation

## 2018-03-07 DIAGNOSIS — R002 Palpitations: Secondary | ICD-10-CM | POA: Diagnosis not present

## 2018-03-22 ENCOUNTER — Encounter: Payer: Self-pay | Admitting: Internal Medicine

## 2018-04-12 ENCOUNTER — Encounter: Payer: Self-pay | Admitting: Internal Medicine

## 2018-04-12 ENCOUNTER — Ambulatory Visit (INDEPENDENT_AMBULATORY_CARE_PROVIDER_SITE_OTHER): Payer: Medicare HMO | Admitting: Internal Medicine

## 2018-04-12 DIAGNOSIS — Z0001 Encounter for general adult medical examination with abnormal findings: Secondary | ICD-10-CM

## 2018-04-12 DIAGNOSIS — L908 Other atrophic disorders of skin: Secondary | ICD-10-CM | POA: Diagnosis not present

## 2018-04-12 DIAGNOSIS — E039 Hypothyroidism, unspecified: Secondary | ICD-10-CM

## 2018-04-12 DIAGNOSIS — I6523 Occlusion and stenosis of bilateral carotid arteries: Secondary | ICD-10-CM

## 2018-04-12 DIAGNOSIS — E785 Hyperlipidemia, unspecified: Secondary | ICD-10-CM

## 2018-04-12 DIAGNOSIS — R300 Dysuria: Secondary | ICD-10-CM

## 2018-04-12 DIAGNOSIS — I10 Essential (primary) hypertension: Secondary | ICD-10-CM

## 2018-04-12 DIAGNOSIS — Z1239 Encounter for other screening for malignant neoplasm of breast: Secondary | ICD-10-CM | POA: Diagnosis not present

## 2018-04-12 DIAGNOSIS — E288 Other ovarian dysfunction: Secondary | ICD-10-CM | POA: Diagnosis not present

## 2018-04-12 DIAGNOSIS — H02409 Unspecified ptosis of unspecified eyelid: Secondary | ICD-10-CM

## 2018-04-12 NOTE — Progress Notes (Signed)
[facility] [place], [place] [address]  Internal MEDICINE  Office Visit Note  Patient Name: Amanda Livingston  [id number]  [id number]  Date of Service: 04/12/2018  Chief Complaint  Patient presents with  . Annual Exam  . Hypertension  . Hypothyroidism    HPI Pt is here for routine health maintenance examination. Pt is to her baseline, saw cardiology for arrhythmias, was instructed to have carotid dopplers. Echo showed diastolic dysfunction. BP is under good control. Pt did have EGD, C/O gerd  She will need BMD  Followed by endocrinology for thyroid cancer s/p thyroidectomy   Current Medication: Outpatient Encounter Medications as of 04/12/2018  Medication Sig Note  . aspirin EC 81 MG tablet Take 81 mg by mouth 3 (three) times a week.   . Biotin 1000 MCG tablet Take 1,000 mcg by mouth daily.   . Calcium 500 MG CHEW Chew by mouth daily.   . Cholecalciferol (VITAMIN D3) 1000 units CAPS Take by mouth.   . cloNIDine (CATAPRES) 0.1 MG tablet Take 1 tablet (0.1 mg total) by mouth 2 (two) times daily as needed. (Patient taking differently: Take by mouth as needed. )   . levothyroxine (SYNTHROID) 137 MCG tablet Take by mouth.   . losartan (COZAAR) 25 MG tablet Take 1 tablet (25 mg total) by mouth 2 (two) times daily.   . metoprolol succinate (TOPROL-XL) 25 MG 24 hr tablet Take 25 mg by mouth daily. take 1/2 tablet 09/16/2017: Taking 1/2 tab. Every day  . Omega-3 Fatty Acids (FISH OIL) 1000 MG CAPS Take by mouth daily.   Marland Kitchen omeprazole (PRILOSEC) 20 MG capsule Take 1 capsule (20 mg total) by mouth daily.   Marland Kitchen tretinoin (RETIN-A) 0.025 % cream Apply topically as needed   . vitamin C (ASCORBIC ACID) 500 MG tablet Take 500 mg by mouth daily.   . ciprofloxacin (CIPRO) 500 MG tablet Take 1 tablet (500 mg total) by mouth 2 (two) times daily. (Patient not taking: Reported on 04/12/2018)   . estradiol (ESTRACE) 0.5 MG tablet Take 1 tablet (0.5 mg total) by mouth daily.  (Patient not taking: Reported on 04/12/2018) 09/16/2017: Every other day per pt  . [DISCONTINUED] fluticasone (FLONASE) 50 MCG/ACT nasal spray Place 2 sprays into both nostrils daily. (Patient not taking: Reported on 10/11/2017)   . [DISCONTINUED] phenazopyridine (PYRIDIUM) 200 MG tablet Take 1 tablet (200 mg total) by mouth 3 (three) times daily as needed for pain.   . [DISCONTINUED] ranitidine (ZANTAC) 150 MG tablet Take 150 mg by mouth at bedtime.    No facility-administered encounter medications on file as of 04/12/2018.     Surgical History: Past Surgical History:  Procedure Laterality Date  . ABDOMINAL HYSTERECTOMY    . ESOPHAGOGASTRODUODENOSCOPY (EGD) WITH PROPOFOL N/A 09/30/2017   Procedure: ESOPHAGOGASTRODUODENOSCOPY (EGD) WITH PROPOFOL;  Surgeon: Virgel Manifold, MD;  Location: [facility] ENDOSCOPY;  Service: Endoscopy;  Laterality: N/A;  . NEPHRECTOMY Left   . THYROIDECTOMY    . TONSILLECTOMY Right     Medical History: Past Medical History:  Diagnosis Date  . Hypertension   . Osteopenia   . 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[place])    thyroid   Family History: Family History  Problem Relation Age of Onset  . Cancer Father    Review of Systems  Constitutional: Negative for chills, diaphoresis and fatigue.  HENT: Negative for ear pain, postnasal drip and sinus pressure.   Eyes: Negative for photophobia, discharge, redness, itching and visual disturbance.  Respiratory: Negative for  cough, shortness of breath and wheezing.   Cardiovascular: Negative for chest pain, palpitations and leg swelling.  Gastrointestinal: Negative for abdominal pain, constipation, diarrhea, nausea and vomiting.  Genitourinary: Negative for dysuria and flank pain.  Musculoskeletal: Negative for arthralgias, back pain, gait problem and neck pain.  Skin: Negative for color change.  Allergic/Immunologic: Negative for environmental allergies and food allergies.  Neurological: Negative for dizziness and headaches.    Hematological: Does not bruise/bleed easily.  Psychiatric/Behavioral: Negative for agitation, behavioral problems (depression) and hallucinations.   Vital Signs: BP 120/68 (BP Location: Left Arm, Patient Position: Sitting, Cuff Size: Normal)   Pulse 73   Resp 16   Ht 5' 6.3" (1.684 m)   Wt 161 lb 12.8 oz (73.4 kg)   SpO2 100%   BMI 25.88 kg/m    Physical Exam  Constitutional: She is oriented to person, place, and time. She appears well-developed and well-nourished. No distress.  HENT:  Head: Normocephalic and atraumatic.  Mouth/Throat: Oropharynx is clear and moist. No oropharyngeal exudate.  Eyes: Pupils are equal, round, and reactive to light. EOM are normal.    PTOSIS bilateral, minimum   Neck: Normal range of motion. Neck supple. No JVD present. No tracheal deviation present. No thyromegaly present.  Cardiovascular: Normal rate, regular rhythm and normal heart sounds. Exam reveals no gallop and no friction rub.  No murmur heard. Pulmonary/Chest: Effort normal. No respiratory distress. She has no wheezes. She has no rales. She exhibits no tenderness. Right breast exhibits no mass and no tenderness. Left breast exhibits no mass and no tenderness. Breasts are symmetrical.  Abdominal: Soft. Bowel sounds are normal.  Musculoskeletal: Normal range of motion.  Lymphadenopathy:    She has no cervical adenopathy.  Neurological: She is alert and oriented to person, place, and time. No cranial nerve deficit.  Skin: Skin is warm and dry. She is not diaphoretic.  Psychiatric: She has a normal mood and affect. Her behavior is normal. Judgment and thought content normal.   Assessment/Plan: 1. Encounter for general adult medical examination with abnormal findings - All PHM is updated ; Future  2. Essential hypertension, benign - Controlled with meds  - Comprehensive metabolic panel  3. Hyperlipidemia, unspecified hyperlipidemia type - Lipid Panel With LDL/HDL Ratio; Future  4.  Hypothyroidism, unspecified type - Being followed by endocrinology, has hx of thyroid cancer. S/p thyroidectomy  - DG Bone Density; Future  5. Occlusion and stenosis of bilateral carotid arteries - Needs follow up for progression of disease, instructed by cardiology  - US Carotid Bilateral; Future  6. Other ovarian dysfunction - H/o osteopenia and has high risk meds   7. 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 Breast screening - MM DIGITAL SCREENING BILATERAL; Future   General Counseling: evalynn hankins understanding of the findings of todays visit and agrees with plan of treatment. I have discussed any further diagnostic evaluation that may be needed or ordered today. We also reviewed her medications today. she has been encouraged to call the office with any questions or concerns that should arise related to todays visit.  Cardiac risk factor modification:  1. Control blood pressure. 2. Exercise as prescribed. 3. Follow low sodium, low fat diet. and low fat and low cholestrol diet. 4. Take ASA 81mg  once a day.( if no allergies) 5. Restricted calories diet to lose weight.   Orders Placed This Encounter  Procedures  .  DG Bone Density  . MM DIGITAL SCREENING BILATERAL  . US Carotid Bilateral  . UA/M w/rflx Culture, Routine  . CBC with Differential/Platelet  . Lipid Panel With LDL/HDL Ratio  . Comprehensive metabolic panel     Time [id number] [place], MD  Internal Medicine

## 2018-04-13 LAB — UA/M W/RFLX CULTURE, ROUTINE
Bilirubin, UA: NEGATIVE
Glucose, UA: NEGATIVE
KETONES UA: NEGATIVE
LEUKOCYTES UA: NEGATIVE
NITRITE UA: NEGATIVE
PROTEIN UA: NEGATIVE
RBC UA: NEGATIVE
Specific Gravity, UA: 1.01 (ref 1.005–1.030)
UUROB: 0.2 mg/dL (ref 0.2–1.0)
pH, UA: 6.5 (ref 5.0–7.5)

## 2018-04-13 LAB — MICROSCOPIC EXAMINATION: Casts: NONE SEEN /lpf

## 2018-04-26 DIAGNOSIS — Z780 Asymptomatic menopausal state: Secondary | ICD-10-CM | POA: Diagnosis not present

## 2018-04-26 DIAGNOSIS — E2839 Other primary ovarian failure: Secondary | ICD-10-CM | POA: Diagnosis not present

## 2018-04-26 DIAGNOSIS — Z1231 Encounter for screening mammogram for malignant neoplasm of breast: Secondary | ICD-10-CM | POA: Diagnosis not present

## 2018-04-28 ENCOUNTER — Ambulatory Visit (INDEPENDENT_AMBULATORY_CARE_PROVIDER_SITE_OTHER): Payer: Medicare HMO | Admitting: Nurse Practitioner

## 2018-04-28 ENCOUNTER — Encounter: Payer: Self-pay | Admitting: Nurse Practitioner

## 2018-04-28 VITALS — BP 108/52 | HR 71 | Resp 16 | Ht 64.0 in | Wt 153.4 lb

## 2018-04-28 DIAGNOSIS — N898 Other specified noninflammatory disorders of vagina: Secondary | ICD-10-CM

## 2018-04-28 DIAGNOSIS — I10 Essential (primary) hypertension: Secondary | ICD-10-CM | POA: Diagnosis not present

## 2018-04-28 DIAGNOSIS — B373 Candidiasis of vulva and vagina: Secondary | ICD-10-CM

## 2018-04-28 DIAGNOSIS — R300 Dysuria: Secondary | ICD-10-CM | POA: Diagnosis not present

## 2018-04-28 DIAGNOSIS — E288 Other ovarian dysfunction: Secondary | ICD-10-CM | POA: Diagnosis not present

## 2018-04-28 DIAGNOSIS — Z1239 Encounter for other screening for malignant neoplasm of breast: Secondary | ICD-10-CM | POA: Diagnosis not present

## 2018-04-28 DIAGNOSIS — I6523 Occlusion and stenosis of bilateral carotid arteries: Secondary | ICD-10-CM | POA: Diagnosis not present

## 2018-04-28 DIAGNOSIS — B9689 Other specified bacterial agents as the cause of diseases classified elsewhere: Secondary | ICD-10-CM | POA: Diagnosis not present

## 2018-04-28 DIAGNOSIS — N760 Acute vaginitis: Secondary | ICD-10-CM

## 2018-04-28 DIAGNOSIS — Z0001 Encounter for general adult medical examination with abnormal findings: Secondary | ICD-10-CM | POA: Diagnosis not present

## 2018-04-28 DIAGNOSIS — E785 Hyperlipidemia, unspecified: Secondary | ICD-10-CM | POA: Diagnosis not present

## 2018-04-28 DIAGNOSIS — E039 Hypothyroidism, unspecified: Secondary | ICD-10-CM | POA: Diagnosis not present

## 2018-04-28 DIAGNOSIS — B3731 Acute candidiasis of vulva and vagina: Secondary | ICD-10-CM

## 2018-04-28 LAB — POCT URINALYSIS DIPSTICK
Bilirubin, UA: NEGATIVE
Glucose, UA: NEGATIVE
Ketones, UA: NEGATIVE
Nitrite, UA: NEGATIVE
Protein, UA: POSITIVE — AB
Spec Grav, UA: 1.01 (ref 1.010–1.025)
Urobilinogen, UA: 0.2 E.U./dL
pH, UA: 5 (ref 5.0–8.0)

## 2018-04-28 MED ORDER — FLUCONAZOLE 150 MG PO TABS: ORAL_TABLET | ORAL | 0 refills | Status: DC

## 2018-04-28 MED ORDER — METRONIDAZOLE 0.75 % VA GEL: 1.0000 | Freq: Every day | VAGINAL | 0 refills | Status: DC

## 2018-04-28 NOTE — Progress Notes (Signed)
[facility], [place] [address]  Internal MEDICINE  Office Visit Note  Patient Name: Amanda Livingston  [id number]  [id number]  Date of Service: 04/28/2018   Pt is here for a sick visit.  Chief Complaint  Patient presents with  . Vaginal Itching    vaginal itching and burning but not when urinating, no discharge or odor, no changes in detergent      The patient is here for sick visit. States that she has been having some itching/burning type pain around the vaginal and urethral area. Does not burn while she is actually urinating, just when she wipes. That is when the feeling is more itching/burning. No abdominal pain. Denies vaginal discharge or odor. Symptoms have been going on for about 3 days. Sates that she took home test, which told her she should be negative for yeast infection.        Current Medication:  Outpatient Encounter Medications as of 04/28/2018  Medication Sig Note  . aspirin EC 81 MG tablet Take 81 mg by mouth 3 (three) times a week.   . Biotin 1000 MCG tablet Take 1,000 mcg by mouth daily.   . Calcium 500 MG CHEW Chew by mouth daily.   . Cholecalciferol (VITAMIN D3) 1000 units CAPS Take by mouth.   . levothyroxine (SYNTHROID) 137 MCG tablet Take by mouth.   . losartan (COZAAR) 25 MG tablet Take 1 tablet (25 mg total) by mouth 2 (two) times daily.   . metoprolol succinate (TOPROL-XL) 25 MG 24 hr tablet Take 25 mg by mouth daily. take 1/2 tablet 09/16/2017: Taking 1/2 tab. Every day  . Omega-3 Fatty Acids (FISH OIL) 1000 MG CAPS Take by mouth daily.   Marland Kitchen omeprazole (PRILOSEC) 20 MG capsule Take 1 capsule (20 mg total) by mouth daily.   Marland Kitchen omeprazole (PRILOSEC) 20 MG capsule Take 1 capsule by mouth.   . tretinoin (RETIN-A) 0.025 % cream Apply topically as needed   . vitamin C (ASCORBIC ACID) 500 MG tablet Take 500 mg by mouth daily.   . ciprofloxacin (CIPRO) 500 MG tablet Take 1 tablet (500 mg total) by mouth 2 (two) times daily.  (Patient not taking: Reported on 04/12/2018)   . cloNIDine (CATAPRES) 0.1 MG tablet Take 1 tablet (0.1 mg total) by mouth 2 (two) times daily as needed. (Patient not taking: Reported on 04/28/2018)   . estradiol (ESTRACE) 0.5 MG tablet Take 1 tablet (0.5 mg total) by mouth daily. (Patient not taking: Reported on 04/12/2018) 09/16/2017: Every other day per pt  . fluconazole (DIFLUCAN) 150 MG tablet Take 1 tablet po once. May repeat dose in 3 days as needed for persistent symptoms.   . metroNIDAZOLE (METROGEL) 0.75 % vaginal gel Place 1 Applicatorful vaginally at bedtime. Please give 7 applicators   . ranitidine (ZANTAC) 150 MG tablet Take 1 tablet by mouth.    No facility-administered encounter medications on file as of 04/28/2018.       Medical History: Past Medical History:  Diagnosis Date  . Hypertension   . Osteopenia   . 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[place])    thyroid     Today's Vitals   04/28/18 0939 04/28/18 0945  BP: (!) 113/52 (!) 108/52  Pulse: 68 71  Resp: 16   SpO2: 97% 96%  Weight: 153 lb 6.4 oz (69.6 kg)   Height: 5\' 4"  (1.626 m)     Review of Systems  Constitutional: Negative for chills, fatigue and unexpected weight change.  HENT:  Negative for congestion, postnasal drip, rhinorrhea, sneezing and sore throat.   Respiratory: Negative for cough, chest tightness and shortness of breath.   Cardiovascular: Negative for chest pain and palpitations.  Gastrointestinal: Negative for abdominal pain, constipation, diarrhea, nausea and vomiting.  Genitourinary: Negative for dysuria and frequency.       Vaginal itching and irritation.   Musculoskeletal: Negative for arthralgias, back pain, joint swelling and neck pain.  Skin: Negative for rash.  Allergic/Immunologic: Negative for environmental allergies.  Neurological: Negative for dizziness, tremors and numbness.  Hematological: Negative for adenopathy. Does not bruise/bleed easily.  Psychiatric/Behavioral: Negative for behavioral  problems (Depression), sleep disturbance and suicidal ideas. The patient is not nervous/anxious.     Physical Exam Vitals signs and nursing note reviewed.  Constitutional:      General: She is not in acute distress.    Appearance: Normal appearance. She is well-developed. She is not diaphoretic.  HENT:     Head: Normocephalic and atraumatic.     Nose: Nose normal.     Mouth/Throat:     Pharynx: No oropharyngeal exudate.  Eyes:     Pupils: Pupils are equal, round, and reactive to light.  Neck:     Musculoskeletal: Normal range of motion and neck supple.     Thyroid: No thyromegaly.     Vascular: No JVD.     Trachea: No tracheal deviation.  Cardiovascular:     Rate and Rhythm: Normal rate and regular rhythm.     Heart sounds: Normal heart sounds. No murmur. No friction rub. No gallop.   Pulmonary:     Effort: Pulmonary effort is normal. No respiratory distress.     Breath sounds: Normal breath sounds. No wheezing or rales.  Chest:     Chest wall: No tenderness.  Abdominal:     General: Bowel sounds are normal.     Palpations: Abdomen is soft.     Tenderness: There is no abdominal tenderness.  Genitourinary:    Comments: Urine sample positive for protein and small wbc Musculoskeletal: Normal range of motion.  Lymphadenopathy:     Cervical: No cervical adenopathy.  Skin:    General: Skin is warm and dry.  Neurological:     General: No focal deficit present.     Mental Status: She is alert and oriented to person, place, and time.     Cranial Nerves: No cranial nerve deficit.  Psychiatric:        Behavior: Behavior normal.        Thought Content: Thought content normal.        Judgment: Judgment normal.    Assessment/Plan: 1. Vaginal itching - POCT Urinalysis Dipstick positive for protein and small white blood cells. Will send for culture and sensitivity and add antibiotic treatment as indicated  2. Bacterial vaginitis Add metronidazole vaginal gel. Insert 1  applicatorful into the vagina every night for 7 nights  - metroNIDAZOLE (METROGEL) 0.75 % vaginal gel; Place 1 Applicatorful vaginally at bedtime. Please give 7 applicators  Dispense: 70 g; Refill: 0  3. Vaginal yeast infection Start diflucan 150mg  one time. May repeat dose in 3 days for persistent symptoms. Will see patient back next week for pelvic exam if no improvement of symptoms.  - fluconazole (DIFLUCAN) 150 MG tablet; Take 1 tablet po once. May repeat dose in 3 days as needed for persistent symptoms.  Dispense: 3 tablet; Refill: 0  General Counseling: alyne martinson understanding of the findings of todays visit and agrees with plan of  treatment. I have discussed any further diagnostic evaluation that may be needed or ordered today. We also reviewed her medications today. she has been encouraged to call the office with any questions or concerns that should arise related to todays visit.    Counseling:  This patient was seen by Leretha Pol FNP Collaboration with Dr Lavera Guise as a part of collaborative care agreement  Orders Placed This Encounter  Procedures  . POCT Urinalysis Dipstick    Meds ordered this encounter  Medications  . fluconazole (DIFLUCAN) 150 MG tablet    Sig: Take 1 tablet po once. May repeat dose in 3 days as needed for persistent symptoms.    Dispense:  3 tablet    Refill:  0    Order Specific Question:   Supervising Provider    Answer:   Lavera Guise [[id number]]  . metroNIDAZOLE (METROGEL) 0.75 % vaginal gel    Sig: Place 1 Applicatorful vaginally at bedtime. Please give 7 applicators    Dispense:  70 g    Refill:  0    Order Specific Question:   Supervising Provider    Answer:   Lavera Guise [[id number]]    Time spent: 25 Minutes

## 2018-04-29 ENCOUNTER — Other Ambulatory Visit: Payer: Self-pay

## 2018-04-29 LAB — COMPREHENSIVE METABOLIC PANEL
ALT: 14 IU/L (ref 0–32)
AST: 22 IU/L (ref 0–40)
Albumin/Globulin Ratio: 2 (ref 1.2–2.2)
Albumin: 4.5 g/dL (ref 3.5–4.8)
Alkaline Phosphatase: 59 IU/L (ref 39–117)
BUN/Creatinine Ratio: 15 (ref 12–28)
BUN: 12 mg/dL (ref 8–27)
Bilirubin Total: 0.4 mg/dL (ref 0.0–1.2)
CO2: 26 mmol/L (ref 20–29)
Calcium: 10.1 mg/dL (ref 8.7–10.3)
Chloride: 97 mmol/L (ref 96–106)
Creatinine, Ser: 0.82 mg/dL (ref 0.57–1.00)
GFR calc Af Amer: 82 mL/min/{1.73_m2} (ref >59)
GFR calc non Af Amer: 71 mL/min/{1.73_m2} (ref >59)
GLUCOSE: 96 mg/dL (ref 65–99)
Globulin, Total: 2.2 g/dL (ref 1.5–4.5)
Potassium: 4.5 mmol/L (ref 3.5–5.2)
Sodium: 137 mmol/L (ref 134–144)
Total Protein: 6.7 g/dL (ref 6.0–8.5)

## 2018-05-01 LAB — CULTURE, URINE COMPREHENSIVE

## 2018-05-05 ENCOUNTER — Telehealth: Payer: Self-pay | Admitting: Nurse Practitioner

## 2018-05-05 NOTE — Telephone Encounter (Signed)
Looks like Dr. Humphrey Rolls also ordered lipid panel and I added thyroid panel to new lab slip. It's on my desk. She can pick up or we can mail it to her. She needs to be fasting. Thanks.

## 2018-05-06 NOTE — Telephone Encounter (Signed)
Informed pt that there is a new lab slip for her blood work, pt stated that she will come by to get the form

## 2018-05-20 ENCOUNTER — Other Ambulatory Visit: Payer: Self-pay

## 2018-05-25 ENCOUNTER — Encounter: Payer: Self-pay | Admitting: Nurse Practitioner

## 2018-05-25 ENCOUNTER — Ambulatory Visit (INDEPENDENT_AMBULATORY_CARE_PROVIDER_SITE_OTHER): Payer: Medicare HMO | Admitting: Nurse Practitioner

## 2018-05-25 VITALS — BP 113/58 | HR 59 | Resp 16 | Ht 64.0 in | Wt 153.0 lb

## 2018-05-25 DIAGNOSIS — N390 Urinary tract infection, site not specified: Secondary | ICD-10-CM | POA: Diagnosis not present

## 2018-05-25 DIAGNOSIS — R300 Dysuria: Secondary | ICD-10-CM | POA: Diagnosis not present

## 2018-05-25 DIAGNOSIS — R319 Hematuria, unspecified: Secondary | ICD-10-CM

## 2018-05-25 LAB — POCT URINALYSIS DIPSTICK
Bilirubin, UA: NEGATIVE
Glucose, UA: NEGATIVE
Ketones, UA: NEGATIVE
Leukocytes, UA: NEGATIVE
Nitrite, UA: NEGATIVE
Protein, UA: NEGATIVE
Urobilinogen, UA: 0.2 E.U./dL
pH, UA: 6 (ref 5.0–8.0)

## 2018-05-25 NOTE — Progress Notes (Signed)
[facility] [place], [place] [address]  Internal MEDICINE  Office Visit Note  Patient Name: Amanda Livingston  [id number]  [id number]  Date of Service: 05/25/2018  Chief Complaint  Patient presents with  . Urinary Tract Infection    STARTED LAST NIGHT/ BURNING     Urinary Tract Infection   This is a recurrent problem. The current episode started yesterday. The problem occurs every urination. The problem has been unchanged. The quality of the pain is described as aching. The pain is at a severity of 4/10. The pain is mild. There has been no fever. She is not sexually active. There is no history of pyelonephritis. Associated symptoms include flank pain, hematuria, nausea and urgency. Pertinent negatives include no chills, frequency or vomiting. She has tried increased fluids and home medications for the symptoms. The treatment provided no relief. Her past medical history is significant for recurrent UTIs.   Pt is here for a sick visit.     Current Medication:  Outpatient Encounter Medications as of 05/25/2018  Medication Sig Note  . aspirin EC 81 MG tablet Take 81 mg by mouth 3 (three) times a week.   . Biotin 1000 MCG tablet Take 1,000 mcg by mouth daily.   . Calcium 500 MG CHEW Chew by mouth daily.   . Cholecalciferol (VITAMIN D3) 1000 units CAPS Take by mouth.   . fluconazole (DIFLUCAN) 150 MG tablet Take 1 tablet po once. May repeat dose in 3 days as needed for persistent symptoms.   Marland Kitchen levothyroxine (SYNTHROID) 137 MCG tablet Take by mouth.   . losartan (COZAAR) 25 MG tablet Take 1 tablet (25 mg total) by mouth 2 (two) times daily.   . metoprolol succinate (TOPROL-XL) 25 MG 24 hr tablet Take 25 mg by mouth daily. take 1/2 tablet 09/16/2017: Taking 1/2 tab. Every day  . metroNIDAZOLE (METROGEL) 0.75 % vaginal gel Place 1 Applicatorful vaginally at bedtime. Please give 7 applicators   . Omega-3 Fatty Acids (FISH OIL) 1000 MG CAPS Take by mouth daily.   Marland Kitchen  omeprazole (PRILOSEC) 20 MG capsule Take 1 capsule (20 mg total) by mouth daily.   Marland Kitchen omeprazole (PRILOSEC) 20 MG capsule Take 1 capsule by mouth.   . ranitidine (ZANTAC) 150 MG tablet Take 1 tablet by mouth.   . tretinoin (RETIN-A) 0.025 % cream Apply topically as needed   . vitamin C (ASCORBIC ACID) 500 MG tablet Take 500 mg by mouth daily.   . ciprofloxacin (CIPRO) 500 MG tablet Take 1 tablet (500 mg total) by mouth 2 (two) times daily. (Patient not taking: Reported on 04/12/2018)   . cloNIDine (CATAPRES) 0.1 MG tablet Take 1 tablet (0.1 mg total) by mouth 2 (two) times daily as needed. (Patient not taking: Reported on 04/28/2018)   . estradiol (ESTRACE) 0.5 MG tablet Take 1 tablet (0.5 mg total) by mouth daily. (Patient not taking: Reported on 04/12/2018) 09/16/2017: Every other day per pt   No facility-administered encounter medications on file as of 05/25/2018.       Medical History: Past Medical History:  Diagnosis Date  . Hypertension   . Osteopenia   . 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[place])    thyroid     Today's Vitals   05/25/18 1026  BP: (!) 113/58  Pulse: (!) 59  Resp: 16  SpO2: 94%  Weight: 153 lb (69.4 kg)  Height: 5\' 4"  (1.626 m)    Review of Systems  Constitutional: Negative for chills, fatigue and unexpected weight  change.  HENT: Negative for congestion, postnasal drip, rhinorrhea, sneezing and sore throat.   Respiratory: Negative for cough, chest tightness and shortness of breath.   Cardiovascular: Negative for chest pain and palpitations.  Gastrointestinal: Positive for nausea. Negative for abdominal pain, constipation, diarrhea and vomiting.  Endocrine: Negative for cold intolerance, heat intolerance, polyphagia and polyuria.  Genitourinary: Positive for dysuria, flank pain, hematuria and urgency. Negative for frequency.  Musculoskeletal: Negative for arthralgias, back pain, joint swelling and neck pain.  Skin: Negative for rash.  Neurological: Negative.  Negative for tremors  and numbness.  Hematological: Negative for adenopathy. Does not bruise/bleed easily.  Psychiatric/Behavioral: Negative for behavioral problems (Depression), sleep disturbance and suicidal ideas. The patient is not nervous/anxious.     Physical Exam Vitals signs and nursing note reviewed.  Constitutional:      General: She is not in acute distress.    Appearance: Normal appearance. She is well-developed. She is not diaphoretic.  HENT:     Head: Normocephalic and atraumatic.     Mouth/Throat:     Pharynx: No oropharyngeal exudate.  Eyes:     Pupils: Pupils are equal, round, and reactive to light.  Neck:     Musculoskeletal: Normal range of motion and neck supple.     Thyroid: No thyromegaly.     Vascular: No JVD.     Trachea: No tracheal deviation.  Cardiovascular:     Rate and Rhythm: Normal rate and regular rhythm.     Heart sounds: Normal heart sounds. No murmur. No friction rub. No gallop.   Pulmonary:     Effort: Pulmonary effort is normal. No respiratory distress.     Breath sounds: Normal breath sounds. No wheezing or rales.  Chest:     Chest wall: No tenderness.  Abdominal:     General: Bowel sounds are normal.     Palpations: Abdomen is soft.  Genitourinary:    Comments: U/a showing trace blood. Musculoskeletal: Normal range of motion.  Lymphadenopathy:     Cervical: No cervical adenopathy.  Skin:    General: Skin is warm and dry.  Neurological:     General: No focal deficit present.     Mental Status: She is alert and oriented to person, place, and time.     Cranial Nerves: No cranial nerve deficit.  Psychiatric:        Behavior: Behavior normal.        Thought Content: Thought content normal.        Judgment: Judgment normal.   Assessment/Plan: 1. Urinary tract infection with hematuria, site unspecified Start cipro 500mg  bid for 7 days. Send urine for culture and sensitivity and adjust antibiotics as indicated.   2. Dysuria - POCT Urinalysis Dipstick -  CULTURE, URINE COMPREHENSIVE  General Counseling: penni penado understanding of the findings of todays visit and agrees with plan of treatment. I have discussed any further diagnostic evaluation that may be needed or ordered today. We also reviewed her medications today. she has been encouraged to call the office with any questions or concerns that should arise related to todays visit.    Counseling:  Rest and increase fluids. Continue using OTC medication to control symptoms.   This patient was seen by Leretha Pol FNP Collaboration with Dr Lavera Guise as a part of collaborative care agreement  Orders Placed This Encounter  Procedures  . CULTURE, URINE COMPREHENSIVE  . POCT Urinalysis Dipstick    Time spent: 25 Minutes

## 2018-05-26 ENCOUNTER — Ambulatory Visit: Payer: Self-pay | Admitting: Nurse Practitioner

## 2018-05-27 ENCOUNTER — Ambulatory Visit: Payer: Medicare HMO

## 2018-05-27 DIAGNOSIS — I6523 Occlusion and stenosis of bilateral carotid arteries: Secondary | ICD-10-CM

## 2018-05-27 DIAGNOSIS — Z1239 Encounter for other screening for malignant neoplasm of breast: Secondary | ICD-10-CM

## 2018-05-27 DIAGNOSIS — E039 Hypothyroidism, unspecified: Secondary | ICD-10-CM

## 2018-05-27 DIAGNOSIS — E288 Other ovarian dysfunction: Secondary | ICD-10-CM

## 2018-05-27 DIAGNOSIS — I10 Essential (primary) hypertension: Secondary | ICD-10-CM

## 2018-05-27 DIAGNOSIS — E785 Hyperlipidemia, unspecified: Secondary | ICD-10-CM

## 2018-05-27 DIAGNOSIS — Z0001 Encounter for general adult medical examination with abnormal findings: Secondary | ICD-10-CM

## 2018-05-27 DIAGNOSIS — R300 Dysuria: Secondary | ICD-10-CM

## 2018-05-28 LAB — CULTURE, URINE COMPREHENSIVE

## 2018-06-01 DIAGNOSIS — I340 Nonrheumatic mitral (valve) insufficiency: Secondary | ICD-10-CM | POA: Diagnosis not present

## 2018-06-01 DIAGNOSIS — I10 Essential (primary) hypertension: Secondary | ICD-10-CM | POA: Diagnosis not present

## 2018-06-01 DIAGNOSIS — R002 Palpitations: Secondary | ICD-10-CM | POA: Diagnosis not present

## 2018-06-01 DIAGNOSIS — I519 Heart disease, unspecified: Secondary | ICD-10-CM | POA: Diagnosis not present

## 2018-06-01 DIAGNOSIS — I491 Atrial premature depolarization: Secondary | ICD-10-CM | POA: Diagnosis not present

## 2018-06-01 DIAGNOSIS — I493 Ventricular premature depolarization: Secondary | ICD-10-CM | POA: Diagnosis not present

## 2018-06-05 NOTE — Procedures (Signed)
[place], [place] [address]  DATE OF SERVICE: May 27, 2018  CAROTID DOPPLER INTERPRETATION:  Bilateral Carotid Ultrsasound and Color Doppler Examination was performed. The RIGHT CCA shows no plaque in the vessel. The LEFT CCA shows mild plaque in the vessel. There was no intimal thickening noted in the RIGHT carotid artery. There was no intimal thickening in the LEFT carotid artery.  The RIGHT CCA shows peak systolic velocity of 87 cm per second. The end diastolic velocity is 15 cm per second on the RIGHT side. The RIGHT ICA shows peak systolic velocity of 78 per second. RIGHT sided ICA end diastolic velocity is 24 cm per second. The RIGHT ECA shows a peak systolic velocity of 61 cm per second. The ICA/CCA ratio is calculated to be 0.9. This suggests less than 50% stenosis. The Vertebral Artery shows antegrade flow.  The LEFT CCA shows peak systolic velocity of 68 cm per second. The end diastolic velocity is 18 cm per second on the LEFT side. The LEFT ICA shows peak systolic velocity of 65 per second. LEFT sided ICA end diastolic velocity is 17 cm per second. The LEFT ECA shows a peak systolic velocity of 60 cm per second. The ICA/CCA ratio is calculated to be 0.9. This suggests less than 50% stenosis. The Vertebral Artery shows antegrade flow.   Impression:    The RIGHT CAROTID shows no significant stenosis. The LEFT CAROTID shows no significant stenosis.  There is mild plaque formation noted on the LEFT and none on the RIGHT  side. Consider a repeat Carotid doppler if clinical situation and symptoms warrant in 6-12 months. Patient should be encouraged to change lifestyles such as smoking cessation, regular exercise and dietary modification. Use of statins in the right clinical setting and ASA is encouraged.  Allyne Gee, MD [facility] Pulmonary Critical Care Medicine

## 2018-06-22 DIAGNOSIS — E890 Postprocedural hypothyroidism: Secondary | ICD-10-CM | POA: Diagnosis not present

## 2018-07-04 ENCOUNTER — Ambulatory Visit (INDEPENDENT_AMBULATORY_CARE_PROVIDER_SITE_OTHER): Payer: Medicare HMO | Admitting: Adult Health

## 2018-07-04 ENCOUNTER — Encounter: Payer: Self-pay | Admitting: Nurse Practitioner

## 2018-07-04 VITALS — BP 127/73 | HR 66 | Temp 97.2°F | Resp 16 | Ht 63.5 in | Wt 150.0 lb

## 2018-07-04 DIAGNOSIS — R319 Hematuria, unspecified: Secondary | ICD-10-CM | POA: Diagnosis not present

## 2018-07-04 DIAGNOSIS — I10 Essential (primary) hypertension: Secondary | ICD-10-CM | POA: Diagnosis not present

## 2018-07-04 DIAGNOSIS — N390 Urinary tract infection, site not specified: Secondary | ICD-10-CM | POA: Diagnosis not present

## 2018-07-04 DIAGNOSIS — R300 Dysuria: Secondary | ICD-10-CM

## 2018-07-04 LAB — POCT URINALYSIS DIPSTICK
Bilirubin, UA: NEGATIVE
Glucose, UA: NEGATIVE
Ketones, UA: NEGATIVE
Nitrite, UA: NEGATIVE
Protein, UA: NEGATIVE
Spec Grav, UA: 1.01 (ref 1.010–1.025)
Urobilinogen, UA: 0.2 E.U./dL
pH, UA: 5 (ref 5.0–8.0)

## 2018-07-04 MED ORDER — AMOXICILLIN-POT CLAVULANATE 875-125 MG PO TABS: 1.0000 | ORAL_TABLET | Freq: Two times a day (BID) | ORAL | 0 refills | Status: DC

## 2018-07-04 NOTE — Progress Notes (Signed)
[facility] [place],  [address]  Internal MEDICINE  Office Visit Note  Patient Name: Amanda Livingston  [id number]  [id number]  Date of Service: 07/04/2018  Chief Complaint  Patient presents with  . Urinary Tract Infection    possible UTI     HPI Pt is here for a sick visit. Pt here reports burning with urination this morning.  She also reports urinary frequency. She has recently been treated for a UTI last month.  She is concerned because usually only has 1 or 2 a year however she is already feeling the symptoms again.  She does report that her bladder needs to be tacked up and we discussed how this could be contributing to her urinary tract.  I will place referral today to refer her to urology to discuss this procedure.     Current Medication:  Outpatient Encounter Medications as of 07/04/2018  Medication Sig Note  . aspirin EC 81 MG tablet Take 81 mg by mouth 3 (three) times a week.   . Biotin 1000 MCG tablet Take 1,000 mcg by mouth daily.   . Calcium 500 MG CHEW Chew by mouth daily.   . Cholecalciferol (VITAMIN D3) 1000 units CAPS Take by mouth.   . cloNIDine (CATAPRES) 0.1 MG tablet Take 1 tablet (0.1 mg total) by mouth 2 (two) times daily as needed.   Marland Kitchen estradiol (ESTRACE) 0.5 MG tablet Take 1 tablet (0.5 mg total) by mouth daily. 09/16/2017: Every other day per pt  . fluconazole (DIFLUCAN) 150 MG tablet Take 1 tablet po once. May repeat dose in 3 days as needed for persistent symptoms.   Marland Kitchen levothyroxine (SYNTHROID) 137 MCG tablet Take by mouth.   . losartan (COZAAR) 25 MG tablet Take 1 tablet (25 mg total) by mouth 2 (two) times daily.   . metoprolol succinate (TOPROL-XL) 25 MG 24 hr tablet Take 25 mg by mouth daily. take 1/2 tablet 09/16/2017: Taking 1/2 tab. Every day  . Omega-3 Fatty Acids (FISH OIL) 1000 MG CAPS Take by mouth daily.   Marland Kitchen omeprazole (PRILOSEC) 20 MG capsule Take 1 capsule (20 mg total) by mouth daily.   Marland Kitchen omeprazole  (PRILOSEC) 20 MG capsule Take 1 capsule by mouth.   . ranitidine (ZANTAC) 150 MG tablet Take 1 tablet by mouth.   . tretinoin (RETIN-A) 0.025 % cream Apply topically as needed   . vitamin C (ASCORBIC ACID) 500 MG tablet Take 500 mg by mouth daily.   Marland Kitchen amoxicillin-clavulanate (AUGMENTIN) 875-125 MG tablet Take 1 tablet by mouth 2 (two) times daily.   . ciprofloxacin (CIPRO) 500 MG tablet Take 1 tablet (500 mg total) by mouth 2 (two) times daily. (Patient not taking: Reported on 07/04/2018)   . metroNIDAZOLE (METROGEL) 0.75 % vaginal gel Place 1 Applicatorful vaginally at bedtime. Please give 7 applicators (Patient not taking: Reported on 07/04/2018)    No facility-administered encounter medications on file as of 07/04/2018.       Medical History: Past Medical History:  Diagnosis Date  . Hypertension   . Osteopenia   . 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[place])    thyroid     Vital Signs: BP 127/73   Pulse 66   Temp (!) 97.2 F (36.2 C)   Resp 16   Ht 5' 3.5" (1.613 m)   Wt 150 lb (68 kg)   SpO2 95%   BMI 26.15 kg/m    Review of Systems  Constitutional: Negative for chills, fatigue and unexpected weight change.  HENT: Negative for congestion, rhinorrhea, sneezing and sore throat.   Eyes: Negative for photophobia, pain and redness.  Respiratory: Negative for cough, chest tightness and shortness of breath.   Cardiovascular: Negative for chest pain and palpitations.  Gastrointestinal: Negative for abdominal pain, constipation, diarrhea, nausea and vomiting.  Endocrine: Negative.   Genitourinary: Positive for dysuria. Negative for frequency.  Musculoskeletal: Negative for arthralgias, back pain, joint swelling and neck pain.  Skin: Negative for rash.  Allergic/Immunologic: Negative.   Neurological: Negative for tremors and numbness.  Hematological: Negative for adenopathy. Does not bruise/bleed easily.  Psychiatric/Behavioral: Negative for behavioral problems and sleep disturbance. The patient is  not nervous/anxious.     Physical Exam Vitals signs and nursing note reviewed.  Constitutional:      General: She is not in acute distress.    Appearance: She is well-developed. She is not diaphoretic.  HENT:     Head: Normocephalic and atraumatic.     Mouth/Throat:     Pharynx: No oropharyngeal exudate.  Eyes:     Pupils: Pupils are equal, round, and reactive to light.  Neck:     Musculoskeletal: Normal range of motion and neck supple.     Thyroid: No thyromegaly.     Vascular: No JVD.     Trachea: No tracheal deviation.  Cardiovascular:     Rate and Rhythm: Normal rate and regular rhythm.     Heart sounds: Normal heart sounds. No murmur. No friction rub. No gallop.   Pulmonary:     Effort: Pulmonary effort is normal. No respiratory distress.     Breath sounds: Normal breath sounds. No wheezing or rales.  Chest:     Chest wall: No tenderness.  Abdominal:     Palpations: Abdomen is soft.     Tenderness: There is no abdominal tenderness. There is no guarding.  Musculoskeletal: Normal range of motion.  Lymphadenopathy:     Cervical: No cervical adenopathy.  Skin:    General: Skin is warm and dry.  Neurological:     Mental Status: She is alert and oriented to person, place, and time.     Cranial Nerves: No cranial nerve deficit.  Psychiatric:        Behavior: Behavior normal.        Thought Content: Thought content normal.        Judgment: Judgment normal.     Assessment/Plan: 1. Urinary tract infection with hematuria, site unspecified Urine culture sent to lab today.  Patient prescribed a course of Augmentin until culture is back. - CULTURE, URINE COMPREHENSIVE - amoxicillin-clavulanate (AUGMENTIN) 875-125 MG tablet; Take 1 tablet by mouth 2 (two) times daily.  Dispense: 20 tablet; Refill: 0  2. Dysuria Dipstick positive for blood and leukocytes.  Patient having referral to urology to discuss bladder tacking and recurrent UTIs. - POCT Urinalysis Dipstick -  Ambulatory referral to Urology  3. Essential hypertension, benign Stable, continue current occasions as prescribed.  General Counseling: rozena fierro understanding of the findings of todays visit and agrees with plan of treatment. I have discussed any further diagnostic evaluation that may be needed or ordered today. We also reviewed her medications today. she has been encouraged to call the office with any questions or concerns that should arise related to todays visit.   Orders Placed This Encounter  Procedures  . CULTURE, URINE COMPREHENSIVE  . Ambulatory referral to Urology  . POCT Urinalysis Dipstick    Meds ordered this encounter  Medications  . amoxicillin-clavulanate (AUGMENTIN) 875-125 MG  tablet    Sig: Take 1 tablet by mouth 2 (two) times daily.    Dispense:  20 tablet    Refill:  0    Time spent: 25 Minutes  This patient was seen by Orson Gear AGNP-C in Collaboration with Dr Lavera Guise as a part of collaborative care agreement.  Kendell Bane AGNP-C Internal Medicine

## 2018-07-04 NOTE — Patient Instructions (Signed)
Urinary Tract Infection, Adult A urinary tract infection (UTI) is an infection of any part of the urinary tract. The urinary tract includes:  The kidneys.  The ureters.  The bladder.  The urethra. These organs make, store, and get rid of pee (urine) in the body. What are the causes? This is caused by germs (bacteria) in your genital area. These germs grow and cause swelling (inflammation) of your urinary tract. What increases the risk? You are more likely to develop this condition if:  You have a small, thin tube (catheter) to drain pee.  You cannot control when you pee or poop (incontinence).  You are female, and: ? You use these methods to prevent pregnancy: ? A medicine that kills sperm (spermicide). ? A device that blocks sperm (diaphragm). ? You have low levels of a female hormone (estrogen). ? You are pregnant.  You have genes that add to your risk.  You are sexually active.  You take antibiotic medicines.  You have trouble peeing because of: ? A prostate that is bigger than normal, if you are female. ? A blockage in the part of your body that drains pee from the bladder (urethra). ? A kidney stone. ? A nerve condition that affects your bladder (neurogenic bladder). ? Not getting enough to drink. ? Not peeing often enough.  You have other conditions, such as: ? Diabetes. ? A weak disease-fighting system (immune system). ? Sickle cell disease. ? Gout. ? Injury of the spine. What are the signs or symptoms? Symptoms of this condition include:  Needing to pee right away (urgently).  Peeing often.  Peeing small amounts often.  Pain or burning when peeing.  Blood in the pee.  Pee that smells bad or not like normal.  Trouble peeing.  Pee that is cloudy.  Fluid coming from the vagina, if you are female.  Pain in the belly or lower back. Other symptoms include:  Throwing up (vomiting).  No urge to eat.  Feeling mixed up (confused).  Being tired  and grouchy (irritable).  A fever.  Watery poop (diarrhea). How is this treated? This condition may be treated with:  Antibiotic medicine.  Other medicines.  Drinking enough water. Follow these instructions at home:  Medicines  Take over-the-counter and prescription medicines only as told by your doctor.  If you were prescribed an antibiotic medicine, take it as told by your doctor. Do not stop taking it even if you start to feel better. General instructions  Make sure you: ? Pee until your bladder is empty. ? Do not hold pee for a long time. ? Empty your bladder after sex. ? Wipe from front to back after pooping if you are a female. Use each tissue one time when you wipe.  Drink enough fluid to keep your pee pale yellow.  Keep all follow-up visits as told by your doctor. This is important. Contact a doctor if:  You do not get better after 1-2 days.  Your symptoms go away and then come back. Get help right away if:  You have very bad back pain.  You have very bad pain in your lower belly.  You have a fever.  You are sick to your stomach (nauseous).  You are throwing up. Summary  A urinary tract infection (UTI) is an infection of any part of the urinary tract.  This condition is caused by germs in your genital area.  There are many risk factors for a UTI. These include having a small, thin   tube to drain pee and not being able to control when you pee or poop.  Treatment includes antibiotic medicines for germs.  Drink enough fluid to keep your pee pale yellow. This information is not intended to replace advice given to you by your health care provider. Make sure you discuss any questions you have with your health care provider. Document Released: 10/14/2007 Document Revised: 11/04/2017 Document Reviewed: 11/04/2017 Elsevier Interactive Patient Education  2019 [employer]  

## 2018-07-05 ENCOUNTER — Telehealth: Payer: Self-pay

## 2018-07-05 NOTE — Telephone Encounter (Signed)
Pt called that she took augmentin last nigh and this morning giving her diarrhea as adam advised to take with food and take imodium and if she don't feel better call us back tomorrow

## 2018-07-07 LAB — CULTURE, URINE COMPREHENSIVE

## 2018-07-11 DIAGNOSIS — E890 Postprocedural hypothyroidism: Secondary | ICD-10-CM | POA: Diagnosis not present

## 2018-07-11 DIAGNOSIS — C73 Malignant neoplasm of thyroid gland: Secondary | ICD-10-CM | POA: Diagnosis not present

## 2018-07-15 ENCOUNTER — Encounter: Payer: Self-pay | Admitting: Nurse Practitioner

## 2018-07-15 ENCOUNTER — Ambulatory Visit: Payer: Self-pay | Admitting: Adult Health

## 2018-07-15 ENCOUNTER — Ambulatory Visit (INDEPENDENT_AMBULATORY_CARE_PROVIDER_SITE_OTHER): Payer: Medicare HMO | Admitting: Nurse Practitioner

## 2018-07-15 ENCOUNTER — Other Ambulatory Visit: Payer: Self-pay

## 2018-07-15 VITALS — BP 129/60 | HR 68 | Resp 16 | Ht 63.5 in | Wt 148.6 lb

## 2018-07-15 DIAGNOSIS — R319 Hematuria, unspecified: Secondary | ICD-10-CM | POA: Diagnosis not present

## 2018-07-15 DIAGNOSIS — N390 Urinary tract infection, site not specified: Secondary | ICD-10-CM | POA: Diagnosis not present

## 2018-07-15 DIAGNOSIS — R300 Dysuria: Secondary | ICD-10-CM | POA: Diagnosis not present

## 2018-07-15 LAB — POCT URINALYSIS DIPSTICK
Bilirubin, UA: NEGATIVE
Glucose, UA: NEGATIVE
Ketones, UA: NEGATIVE
Leukocytes, UA: NEGATIVE
Nitrite, UA: NEGATIVE
PH UA: 7.5 (ref 5.0–8.0)
PROTEIN UA: NEGATIVE
Spec Grav, UA: 1.01 (ref 1.010–1.025)
Urobilinogen, UA: 0.2 E.U./dL

## 2018-07-15 MED ORDER — CIPROFLOXACIN HCL 500 MG PO TABS: 500.0000 mg | ORAL_TABLET | Freq: Two times a day (BID) | ORAL | 0 refills | Status: DC

## 2018-07-15 NOTE — Progress Notes (Signed)
[facility] [place], [place] [address]  Internal MEDICINE  Office Visit Note  Patient Name: Amanda Livingston  [id number]  [id number]  Date of Service: 07/30/2018   Pt is here for a sick visit.   Chief Complaint  Patient presents with  . Urinary Tract Infection    lower back pain just finished antibiotic this morning, has an appointment with the urologist next week,     Pt is here for a sick visit. Pt having right flank pain which started yesterday morning. .  She also reports urinary frequency. She has recently been treated for a UTI. In fact, finished antibiotic treatment yesterday morning.  She is concerned because usually only has 1 or 2 a year however she is already feeling the symptoms again.  She does report that her bladder needs to be tacked up and we discussed how this could be contributing to her urinary tract. She has been referred to urology and her new patient appointment is a week from Tuesday.        Current Medication:  Outpatient Encounter Medications as of 07/15/2018  Medication Sig Note  . aspirin EC 81 MG tablet Take 81 mg by mouth 3 (three) times a week.   . Biotin 1000 MCG tablet Take 1,000 mcg by mouth daily.   . Calcium 500 MG CHEW Chew by mouth daily.   . Cholecalciferol (VITAMIN D3) 1000 units CAPS Take by mouth.   . cloNIDine (CATAPRES) 0.1 MG tablet Take 1 tablet (0.1 mg total) by mouth 2 (two) times daily as needed.   Marland Kitchen estradiol (ESTRACE) 0.5 MG tablet Take 1 tablet (0.5 mg total) by mouth daily. 09/16/2017: Every other day per pt  . fluconazole (DIFLUCAN) 150 MG tablet Take 1 tablet po once. May repeat dose in 3 days as needed for persistent symptoms.   Marland Kitchen levothyroxine (SYNTHROID) 137 MCG tablet Take by mouth.   . losartan (COZAAR) 25 MG tablet Take 1 tablet (25 mg total) by mouth 2 (two) times daily.   . metoprolol succinate (TOPROL-XL) 25 MG 24 hr tablet Take 25 mg by mouth daily. take 1/2 tablet 09/16/2017: Taking 1/2  tab. Every day  . Omega-3 Fatty Acids (FISH OIL) 1000 MG CAPS Take by mouth daily.   Marland Kitchen tretinoin (RETIN-A) 0.025 % cream Apply topically as needed   . vitamin C (ASCORBIC ACID) 500 MG tablet Take 500 mg by mouth daily.   . [DISCONTINUED] omeprazole (PRILOSEC) 20 MG capsule Take 1 capsule (20 mg total) by mouth daily.   . [DISCONTINUED] omeprazole (PRILOSEC) 20 MG capsule Take 1 capsule by mouth.   . [DISCONTINUED] ranitidine (ZANTAC) 150 MG tablet Take 1 tablet by mouth.   . [DISCONTINUED] amoxicillin-clavulanate (AUGMENTIN) 875-125 MG tablet Take 1 tablet by mouth 2 (two) times daily. (Patient not taking: Reported on 07/15/2018)   . [DISCONTINUED] ciprofloxacin (CIPRO) 500 MG tablet Take 1 tablet (500 mg total) by mouth 2 (two) times daily. (Patient not taking: Reported on 07/04/2018)   . [DISCONTINUED] ciprofloxacin (CIPRO) 500 MG tablet Take 1 tablet (500 mg total) by mouth 2 (two) times daily.   . [DISCONTINUED] metroNIDAZOLE (METROGEL) 0.75 % vaginal gel Place 1 Applicatorful vaginally at bedtime. Please give 7 applicators (Patient not taking: Reported on 07/04/2018)    No facility-administered encounter medications on file as of 07/15/2018.       Medical History: Past Medical History:  Diagnosis Date  . Hypertension   . Osteopenia   . 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  HENT: Negative for congestion, rhinorrhea, sneezing and sore throat.   Eyes: Negative for photophobia, pain and redness.  Respiratory: Negative for cough, chest tightness and shortness of breath.   Cardiovascular: Negative for chest pain and palpitations.  Gastrointestinal: Negative for abdominal pain, constipation,  diarrhea, nausea and vomiting.  Endocrine: Negative.   Genitourinary: Positive for flank pain. Negative for frequency.       Recurrent urinary tract infections.   Musculoskeletal: Negative for arthralgias, back pain, joint swelling and neck pain.  Skin: Negative for rash.  Allergic/Immunologic: Negative.   Neurological: Negative for tremors and numbness.  Hematological: Negative for adenopathy. Does not bruise/bleed easily.  Psychiatric/Behavioral: Negative for behavioral problems and sleep disturbance. The patient is not nervous/anxious.     Physical Exam Vitals signs and nursing note reviewed.  Constitutional:      General: She is not in acute distress.    Appearance: Normal appearance. She is well-developed. She is not diaphoretic.  HENT:     Head: Normocephalic and atraumatic.     Mouth/Throat:     Pharynx: No oropharyngeal exudate.  Eyes:     Pupils: Pupils are equal, round, and reactive to light.  Neck:     Musculoskeletal: Normal range of motion and neck supple.     Thyroid: No thyromegaly.     Vascular: No JVD.     Trachea: No tracheal deviation.  Cardiovascular:     Rate and Rhythm: Normal rate and regular rhythm.     Heart sounds: Normal heart sounds. No murmur. No friction rub. No gallop.   Pulmonary:     Effort: Pulmonary effort is normal. No respiratory distress.     Breath sounds: Normal breath sounds. No wheezing or rales.  Chest:     Chest wall: No tenderness.  Abdominal:     General: Bowel sounds are normal.     Palpations: Abdomen is soft.  Genitourinary:    Comments: There is small amount of blood in urine sample today. Musculoskeletal: Normal range of motion.  Lymphadenopathy:     Cervical: No cervical adenopathy.  Skin:    General: Skin is warm and dry.  Neurological:     General: No focal deficit present.     Mental Status: She is alert and oriented to person, place, and time.     Cranial Nerves: No cranial nerve deficit.  Psychiatric:         Behavior: Behavior normal.        Thought Content: Thought content normal.        Judgment: Judgment normal.   Assessment/Plan:  1. Urinary tract infection with hematuria, site unspecified Start cipro 500mg  twice daily for seven days. Send urine for culture and sensitivity and adjust antibiotic treatment as indicated.   2. Dysuria - POCT Urinalysis Dipstick - CULTURE, URINE COMPREHENSIVE   General Counseling: chelcey caputo understanding of the findings of todays visit and agrees with plan of treatment. I have discussed any further diagnostic evaluation that may be needed or ordered today. We also reviewed her medications today. she has been encouraged to call the office with  any questions or concerns that should arise related to todays visit.    Counseling:  This patient was seen by Leretha Pol FNP Collaboration with Dr Lavera Guise as a part of collaborative care agreement  Orders Placed This Encounter  Procedures  . CULTURE, URINE COMPREHENSIVE  . POCT Urinalysis Dipstick    Meds ordered this encounter  Medications  . DISCONTD: ciprofloxacin (CIPRO) 500 MG tablet    Sig: Take 1 tablet (500 mg total) by mouth 2 (two) times daily.    Dispense:  14 tablet    Refill:  0    Order Specific Question:   Supervising Provider    Answer:   Lavera Guise [[id number]]    Time spent: 25 Minutes

## 2018-07-15 NOTE — Patient Instructions (Addendum)
Urinary Tract Infection, Adult A urinary tract infection (UTI) is an infection of any part of the urinary tract. The urinary tract includes:  The kidneys.  The ureters.  The bladder.  The urethra. These organs make, store, and get rid of pee (urine) in the body. What are the causes? This is caused by germs (bacteria) in your genital area. These germs grow and cause swelling (inflammation) of your urinary tract. What increases the risk? You are more likely to develop this condition if:  You have a small, thin tube (catheter) to drain pee.  You cannot control when you pee or poop (incontinence).  You are female, and: ? You use these methods to prevent pregnancy: ? A medicine that kills sperm (spermicide). ? A device that blocks sperm (diaphragm). ? You have low levels of a female hormone (estrogen). ? You are pregnant.  You have genes that add to your risk.  You are sexually active.  You take antibiotic medicines.  You have trouble peeing because of: ? A prostate that is bigger than normal, if you are female. ? A blockage in the part of your body that drains pee from the bladder (urethra). ? A kidney stone. ? A nerve condition that affects your bladder (neurogenic bladder). ? Not getting enough to drink. ? Not peeing often enough.  You have other conditions, such as: ? Diabetes. ? A weak disease-fighting system (immune system). ? Sickle cell disease. ? Gout. ? Injury of the spine. What are the signs or symptoms? Symptoms of this condition include:  Needing to pee right away (urgently).  Peeing often.  Peeing small amounts often.  Pain or burning when peeing.  Blood in the pee.  Pee that smells bad or not like normal.  Trouble peeing.  Pee that is cloudy.  Fluid coming from the vagina, if you are female.  Pain in the belly or lower back. Other symptoms include:  Throwing up (vomiting).  No urge to eat.  Feeling mixed up (confused).  Being tired  and grouchy (irritable).  A fever.  Watery poop (diarrhea). How is this treated? This condition may be treated with:  Antibiotic medicine.  Other medicines.  Drinking enough water. Follow these instructions at home:  Medicines  Take over-the-counter and prescription medicines only as told by your doctor.  If you were prescribed an antibiotic medicine, take it as told by your doctor. Do not stop taking it even if you start to feel better. General instructions  Make sure you: ? Pee until your bladder is empty. ? Do not hold pee for a long time. ? Empty your bladder after sex. ? Wipe from front to back after pooping if you are a female. Use each tissue one time when you wipe.  Drink enough fluid to keep your pee pale yellow.  Keep all follow-up visits as told by your doctor. This is important. Contact a doctor if:  You do not get better after 1-2 days.  Your symptoms go away and then come back. Get help right away if:  You have very bad back pain.  You have very bad pain in your lower belly.  You have a fever.  You are sick to your stomach (nauseous).  You are throwing up. Summary  A urinary tract infection (UTI) is an infection of any part of the urinary tract.  This condition is caused by germs in your genital area.  There are many risk factors for a UTI. These include having a small, thin   tube to drain pee and not being able to control when you pee or poop.  Treatment includes antibiotic medicines for germs.  Drink enough fluid to keep your pee pale yellow. This information is not intended to replace advice given to you by your health care provider. Make sure you discuss any questions you have with your health care provider. Document Released: 10/14/2007 Document Revised: 11/04/2017 Document Reviewed: 11/04/2017 Elsevier Interactive Patient Education  2019 [place].  Urinary Tract Infection, Adult A urinary tract infection (UTI) is an infection of  any part of the urinary tract. The urinary tract includes:  The kidneys.  The ureters.  The bladder.  The urethra. These organs make, store, and get rid of pee (urine) in the body. What are the causes? This is caused by germs (bacteria) in your genital area. These germs grow and cause swelling (inflammation) of your urinary tract. What increases the risk? You are more likely to develop this condition if:  You have a small, thin tube (catheter) to drain pee.  You cannot control when you pee or poop (incontinence).  You are female, and: ? You use these methods to prevent pregnancy: ? A medicine that kills sperm (spermicide). ? A device that blocks sperm (diaphragm). ? You have low levels of a female hormone (estrogen). ? You are pregnant.  You have genes that add to your risk.  You are sexually active.  You take antibiotic medicines.  You have trouble peeing because of: ? A prostate that is bigger than normal, if you are female. ? A blockage in the part of your body that drains pee from the bladder (urethra). ? A kidney stone. ? A nerve condition that affects your bladder (neurogenic bladder). ? Not getting enough to drink. ? Not peeing often enough.  You have other conditions, such as: ? Diabetes. ? A weak disease-fighting system (immune system). ? Sickle cell disease. ? Gout. ? Injury of the spine. What are the signs or symptoms? Symptoms of this condition include:  Needing to pee right away (urgently).  Peeing often.  Peeing small amounts often.  Pain or burning when peeing.  Blood in the pee.  Pee that smells bad or not like normal.  Trouble peeing.  Pee that is cloudy.  Fluid coming from the vagina, if you are female.  Pain in the belly or lower back. Other symptoms include:  Throwing up (vomiting).  No urge to eat.  Feeling mixed up (confused).  Being tired and grouchy (irritable).  A fever.  Watery poop (diarrhea). How is this  treated? This condition may be treated with:  Antibiotic medicine.  Other medicines.  Drinking enough water. Follow these instructions at home:  Medicines  Take over-the-counter and prescription medicines only as told by your doctor.  If you were prescribed an antibiotic medicine, take it as told by your doctor. Do not stop taking it even if you start to feel better. General instructions  Make sure you: ? Pee until your bladder is empty. ? Do not hold pee for a long time. ? Empty your bladder after sex. ? Wipe from front to back after pooping if you are a female. Use each tissue one time when you wipe.  Drink enough fluid to keep your pee pale yellow.  Keep all follow-up visits as told by your doctor. This is important. Contact a doctor if:  You do not get better after 1-2 days.  Your symptoms go away and then come back. Get help right  away if:  You have very bad back pain.  You have very bad pain in your lower belly.  You have a fever.  You are sick to your stomach (nauseous).  You are throwing up. Summary  A urinary tract infection (UTI) is an infection of any part of the urinary tract.  This condition is caused by germs in your genital area.  There are many risk factors for a UTI. These include having a small, thin tube to drain pee and not being able to control when you pee or poop.  Treatment includes antibiotic medicines for germs.  Drink enough fluid to keep your pee pale yellow. This information is not intended to replace advice given to you by your health care provider. Make sure you discuss any questions you have with your health care provider. Document Released: 10/14/2007 Document Revised: 11/04/2017 Document Reviewed: 11/04/2017 Elsevier Interactive Patient Education  2019 [place].  Urinary Tract Infection, Adult A urinary tract infection (UTI) is an infection of any part of the urinary tract. The urinary tract includes:  The  kidneys.  The ureters.  The bladder.  The urethra. These organs make, store, and get rid of pee (urine) in the body. What are the causes? This is caused by germs (bacteria) in your genital area. These germs grow and cause swelling (inflammation) of your urinary tract. What increases the risk? You are more likely to develop this condition if:  You have a small, thin tube (catheter) to drain pee.  You cannot control when you pee or poop (incontinence).  You are female, and: ? You use these methods to prevent pregnancy: ? A medicine that kills sperm (spermicide). ? A device that blocks sperm (diaphragm). ? You have low levels of a female hormone (estrogen). ? You are pregnant.  You have genes that add to your risk.  You are sexually active.  You take antibiotic medicines.  You have trouble peeing because of: ? A prostate that is bigger than normal, if you are female. ? A blockage in the part of your body that drains pee from the bladder (urethra). ? A kidney stone. ? A nerve condition that affects your bladder (neurogenic bladder). ? Not getting enough to drink. ? Not peeing often enough.  You have other conditions, such as: ? Diabetes. ? A weak disease-fighting system (immune system). ? Sickle cell disease. ? Gout. ? Injury of the spine. What are the signs or symptoms? Symptoms of this condition include:  Needing to pee right away (urgently).  Peeing often.  Peeing small amounts often.  Pain or burning when peeing.  Blood in the pee.  Pee that smells bad or not like normal.  Trouble peeing.  Pee that is cloudy.  Fluid coming from the vagina, if you are female.  Pain in the belly or lower back. Other symptoms include:  Throwing up (vomiting).  No urge to eat.  Feeling mixed up (confused).  Being tired and grouchy (irritable).  A fever.  Watery poop (diarrhea). How is this treated? This condition may be treated with:  Antibiotic  medicine.  Other medicines.  Drinking enough water. Follow these instructions at home:  Medicines  Take over-the-counter and prescription medicines only as told by your doctor.  If you were prescribed an antibiotic medicine, take it as told by your doctor. Do not stop taking it even if you start to feel better. General instructions  Make sure you: ? Pee until your bladder is empty. ? Do not hold  pee for a long time. ? Empty your bladder after sex. ? Wipe from front to back after pooping if you are a female. Use each tissue one time when you wipe.  Drink enough fluid to keep your pee pale yellow.  Keep all follow-up visits as told by your doctor. This is important. Contact a doctor if:  You do not get better after 1-2 days.  Your symptoms go away and then come back. Get help right away if:  You have very bad back pain.  You have very bad pain in your lower belly.  You have a fever.  You are sick to your stomach (nauseous).  You are throwing up. Summary  A urinary tract infection (UTI) is an infection of any part of the urinary tract.  This condition is caused by germs in your genital area.  There are many risk factors for a UTI. These include having a small, thin tube to drain pee and not being able to control when you pee or poop.  Treatment includes antibiotic medicines for germs.  Drink enough fluid to keep your pee pale yellow. This information is not intended to replace advice given to you by your health care provider. Make sure you discuss any questions you have with your health care provider. Document Released: 10/14/2007 Document Revised: 11/04/2017 Document Reviewed: 11/04/2017 Elsevier Interactive Patient Education  2019 [place].  Urinary Tract Infection, Adult A urinary tract infection (UTI) is an infection of any part of the urinary tract. The urinary tract includes:  The kidneys.  The ureters.  The bladder.  The urethra. These organs  make, store, and get rid of pee (urine) in the body. What are the causes? This is caused by germs (bacteria) in your genital area. These germs grow and cause swelling (inflammation) of your urinary tract. What increases the risk? You are more likely to develop this condition if:  You have a small, thin tube (catheter) to drain pee.  You cannot control when you pee or poop (incontinence).  You are female, and: ? You use these methods to prevent pregnancy: ? A medicine that kills sperm (spermicide). ? A device that blocks sperm (diaphragm). ? You have low levels of a female hormone (estrogen). ? You are pregnant.  You have genes that add to your risk.  You are sexually active.  You take antibiotic medicines.  You have trouble peeing because of: ? A prostate that is bigger than normal, if you are female. ? A blockage in the part of your body that drains pee from the bladder (urethra). ? A kidney stone. ? A nerve condition that affects your bladder (neurogenic bladder). ? Not getting enough to drink. ? Not peeing often enough.  You have other conditions, such as: ? Diabetes. ? A weak disease-fighting system (immune system). ? Sickle cell disease. ? Gout. ? Injury of the spine. What are the signs or symptoms? Symptoms of this condition include:  Needing to pee right away (urgently).  Peeing often.  Peeing small amounts often.  Pain or burning when peeing.  Blood in the pee.  Pee that smells bad or not like normal.  Trouble peeing.  Pee that is cloudy.  Fluid coming from the vagina, if you are female.  Pain in the belly or lower back. Other symptoms include:  Throwing up (vomiting).  No urge to eat.  Feeling mixed up (confused).  Being tired and grouchy (irritable).  A fever.  Watery poop (diarrhea). How is this treated?  This condition may be treated with:  Antibiotic medicine.  Other medicines.  Drinking enough water. Follow these instructions  at home:  Medicines  Take over-the-counter and prescription medicines only as told by your doctor.  If you were prescribed an antibiotic medicine, take it as told by your doctor. Do not stop taking it even if you start to feel better. General instructions  Make sure you: ? Pee until your bladder is empty. ? Do not hold pee for a long time. ? Empty your bladder after sex. ? Wipe from front to back after pooping if you are a female. Use each tissue one time when you wipe.  Drink enough fluid to keep your pee pale yellow.  Keep all follow-up visits as told by your doctor. This is important. Contact a doctor if:  You do not get better after 1-2 days.  Your symptoms go away and then come back. Get help right away if:  You have very bad back pain.  You have very bad pain in your lower belly.  You have a fever.  You are sick to your stomach (nauseous).  You are throwing up. Summary  A urinary tract infection (UTI) is an infection of any part of the urinary tract.  This condition is caused by germs in your genital area.  There are many risk factors for a UTI. These include having a small, thin tube to drain pee and not being able to control when you pee or poop.  Treatment includes antibiotic medicines for germs.  Drink enough fluid to keep your pee pale yellow. This information is not intended to replace advice given to you by your health care provider. Make sure you discuss any questions you have with your health care provider. Document Released: 10/14/2007 Document Revised: 11/04/2017 Document Reviewed: 11/04/2017 Elsevier Interactive Patient Education  2019 [employer].

## 2018-07-19 LAB — CULTURE, URINE COMPREHENSIVE

## 2018-07-26 ENCOUNTER — Encounter: Payer: Self-pay | Admitting: Urology

## 2018-07-26 ENCOUNTER — Other Ambulatory Visit: Payer: Self-pay

## 2018-07-26 ENCOUNTER — Ambulatory Visit: Payer: Medicare HMO | Admitting: Urology

## 2018-07-26 VITALS — BP 122/75 | HR 62 | Ht 63.0 in | Wt 141.0 lb

## 2018-07-26 DIAGNOSIS — Q600 Renal agenesis, unilateral: Secondary | ICD-10-CM | POA: Diagnosis not present

## 2018-07-26 DIAGNOSIS — R300 Dysuria: Secondary | ICD-10-CM | POA: Diagnosis not present

## 2018-07-26 DIAGNOSIS — N390 Urinary tract infection, site not specified: Secondary | ICD-10-CM | POA: Diagnosis not present

## 2018-07-26 DIAGNOSIS — R319 Hematuria, unspecified: Secondary | ICD-10-CM | POA: Diagnosis not present

## 2018-07-26 DIAGNOSIS — IMO0002 Reserved for concepts with insufficient information to code with codable children: Secondary | ICD-10-CM

## 2018-07-26 LAB — URINALYSIS, COMPLETE
Bilirubin, UA: NEGATIVE
Glucose, UA: NEGATIVE
Ketones, UA: NEGATIVE
Leukocytes, UA: NEGATIVE
Nitrite, UA: NEGATIVE
Protein, UA: NEGATIVE
RBC, UA: NEGATIVE
SPEC GRAV UA: 1.02 (ref 1.005–1.030)
Urobilinogen, Ur: 0.2 mg/dL (ref 0.2–1.0)
pH, UA: 7 (ref 5.0–7.5)

## 2018-07-26 LAB — MICROSCOPIC EXAMINATION
Epithelial Cells (non renal): >10 /hpf — ABNORMAL HIGH (ref 0–10)
RBC, UA: NONE SEEN /hpf (ref 0–2)
WBC, UA: NONE SEEN /hpf (ref 0–5)

## 2018-07-26 LAB — BLADDER SCAN AMB NON-IMAGING

## 2018-07-26 NOTE — Progress Notes (Signed)
07/26/2018  11:35 AM   Amanda Livingston [id number] [id number]  Referring provider: Lavera Guise, [address] [place], [place] [address]  Chief Complaint  Patient presents with  . Recurrent UTI    New Patient    HPI: Amanda Livingston is a 73 yo F who presents today for the evaluation and management of UTIs. She was referred to [place] by Lavera Guise, MD.   Former pt of Dr. Jacqlyn Larsen who was last seen in 2018.   She visited her PCP on 07/15/2018 where she reported of urinary frequency and right flank pain. She had a completely negative UA and culture at this time. She was notably seen by PCP at least 5 times with symptoms of burning with negative UA and urine cultures. Despite negative UA and urine cultures, she was given antibiotics.  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  Her renal function is remained unremarkable.  She notices symptoms of UTI after she has intercourse.  The symptoms include dysuria and increased frequency.  Her dysuria is mild and comes and goes.  Due to gross hematuria, fevers or chills.   She has been seen by OB/GYN and had a urodynamics study in the past in [place]. There was indication of a mild prolapse but did not require surgery. No symptoms of bulging and true UTIs. She does have some daytime frequency, nocturia x1, and urge incontinence with stress incontinence, overall minimal bothersome.    He denies any vaginal dryness.  No pain with intercourse.  No vaginal bulging or pressure.  She is currently on oral replacement therapy.  She had a hysterectomy in her 20s secondary to fibroids.  She is postmenopausal.  She does not have any recent upper tract imaging.   Her PVR is 4 mL, voiding well.   Her UA is negative except for evident for contamination of  >10 epithelial cells.   She has smoked for many years.   PMH: Past Medical History:  Diagnosis Date  . Hypertension   . Osteopenia   .  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 ABDOMINAL HYSTERECTOMY    . ESOPHAGOGASTRODUODENOSCOPY (EGD) WITH PROPOFOL N/A 09/30/2017   Procedure: ESOPHAGOGASTRODUODENOSCOPY (EGD) WITH PROPOFOL;  Surgeon: Virgel Manifold, MD;  Location: [facility] ENDOSCOPY;  Service: Endoscopy;  Laterality: N/A;  . NEPHRECTOMY Left   . THYROIDECTOMY    . TONSILLECTOMY Right     Home Medications:  Allergies as of 07/26/2018      Reactions   Nitrofurantoin Rash      Medication List       Accurate as of July 26, 2018 11:35 AM. Always use your most recent med list.        aspirin EC 81 MG tablet Take 81 mg by mouth 3 (three) times a week.   Biotin 1000 MCG tablet Take 1,000 mcg by mouth daily.   Calcium 500 MG Chew Chew by mouth daily.   cloNIDine 0.1 MG tablet Commonly known as:  Catapres Take 1 tablet (0.1 mg total) by mouth 2 (two) times daily as needed.   estradiol 0.5 MG tablet Commonly known as:  ESTRACE Take 1 tablet (0.5 mg total) by mouth daily.   Fish Oil 1000 MG Caps Take by mouth daily.   fluconazole 150 MG tablet Commonly known as:  DIFLUCAN Take 1 tablet po once.  May repeat dose in 3 days as needed for persistent symptoms.   losartan 25 MG tablet Commonly known as:  COZAAR Take 1 tablet (25 mg total) by mouth 2 (two) times daily.   metoprolol succinate 25 MG 24 hr tablet Commonly known as:  TOPROL-XL Take 25 mg by mouth daily. take 1/2 tablet   Synthroid 137 MCG tablet Generic drug:  levothyroxine Take by mouth.   tretinoin 0.025 % cream Commonly known as:  RETIN-A Apply topically as needed   vitamin C 500 MG tablet Commonly known as:  ASCORBIC ACID Take 500 mg by mouth daily.   Vitamin D3 25 MCG (1000 UT) Caps Take by mouth.       Allergies:  Allergies  Allergen Reactions  . Nitrofurantoin Rash    Family History: Family History  Problem Relation Age of Onset  . Cancer Father      Social History:  reports that she quit smoking about 16 years ago. She has never used smokeless tobacco. She reports that she does not drink alcohol or use drugs.  ROS: UROLOGY Frequent Urination?: Yes Hard to postpone urination?: No Burning/pain with urination?: No Get up at night to urinate?: No Leakage of urine?: No Urine stream starts and stops?: No Trouble starting stream?: No Do you have to strain to urinate?: No Blood in urine?: No Urinary tract infection?: Yes Sexually transmitted disease?: No Injury to kidneys or bladder?: Yes Painful intercourse?: No Weak stream?: No Currently pregnant?: No Vaginal bleeding?: No Last menstrual period?: n  Gastrointestinal Nausea?: No Vomiting?: No Indigestion/heartburn?: No Diarrhea?: No Constipation?: No  Constitutional Fever: No Night sweats?: No Weight loss?: No Fatigue?: No  Skin Skin rash/lesions?: No Itching?: No  Eyes Blurred vision?: No Double vision?: No  Ears/Nose/Throat Sore throat?: No Sinus problems?: No  Hematologic/Lymphatic Swollen glands?: No Easy bruising?: No  Cardiovascular Leg swelling?: No Chest pain?: No  Respiratory Cough?: No Shortness of breath?: No  Endocrine Excessive thirst?: No  Musculoskeletal Back pain?: No Joint pain?: No  Neurological Headaches?: No Dizziness?: No  Psychologic Depression?: No Anxiety?: No  Physical Exam: BP 122/75   Pulse 62   Ht 5\' 3"  (1.6 m)   Wt 141 lb (64 kg)   BMI 24.98 kg/m   Constitutional:  Alert and oriented, No acute distress. HEENT:  AT, moist mucus membranes.  Trachea midline, no masses. Cardiovascular: No clubbing, cyanosis, or edema. Respiratory: Normal respiratory effort, no increased work of breathing. Skin: No rashes, bruises or suspicious lesions. Neurologic: Grossly intact, no focal deficits, moving all 4 extremities. Psychiatric: Normal mood and affect.  Laboratory Data:  Urinalysis UA is negative except  for evident for contamination of  >10 epithelial cells.  Pertinent Imaging: Results for orders placed or performed in visit on 07/26/18  BLADDER SCAN AMB NON-IMAGING  Result Value Ref Range   Scan Result 35ml    Assessment & Plan:    1. Dysuria No evidence of rUTIs based on UAs and urine culture  UA is negative except for evident for contamination of  >10 epithelial cells.  Will send for urine cytology   Consider cystoscopy if persistent dysuria  Encouraged her to come here specifically if she has episodes of dysuria and symptoms of UTI for assessment she is agreeable Return in 1 year for recheck with PVR and UA   2. Cystocele  Probable organ pelvic prolapse  Asymptomatic PVR 4 mL, adequate emptying  3. Solitary kidney  Recent episode of right flank pain and will reaccess  with RUS  Schedule for RUS    Return in about 1 year (around 07/26/2019) for rehceck with PVR, UA.  [facility] [address], [place], [place] [address] [phone]  I, Lucas Mallow, am acting as a scribe for Dr. Hollice Espy,  I have reviewed the above documentation for accuracy and completeness, and I agree with the above.   Hollice Espy, MD

## 2018-07-29 ENCOUNTER — Other Ambulatory Visit: Payer: Self-pay | Admitting: Urology

## 2018-08-01 ENCOUNTER — Telehealth: Payer: Self-pay | Admitting: Urology

## 2018-08-01 NOTE — Telephone Encounter (Addendum)
In general, low back pain is not a sign of any bladder issues.  In addition, her negative urine and negative urine cytology all are very reassuring that she has no bladder pathology.  Did not send her urine culture as her UA was negative and she had a negative urine culture earlier this month.  In light of global pandemic, we are deferring all in office-based procedures that are not emergent which I feel that her procedure is not at this time.  If she feels strongly about this procedure, please schedule cystoscopy for in 3 months.  Hollice Espy, MD

## 2018-08-01 NOTE — Telephone Encounter (Signed)
Pt wants to know if she can come back and talk to Dr Erlene Quan. She is having some low back pain.

## 2018-08-01 NOTE — Telephone Encounter (Signed)
Spoke with patient-she forgot to mention the low back pain at her last visit. Does not occur all the time only intermittently-denies dysuria. She would like to proceed with cysto as discussed at her visit on 07/26/2018. She would like her urine culture results. Please advise.

## 2018-08-02 NOTE — Telephone Encounter (Signed)
Patient reluctantly agreed to a 3 month cysto.  Appointment made.

## 2018-08-02 NOTE — Telephone Encounter (Signed)
Called pt informed her of the information below, patient was belligerent call transferred to management.

## 2018-08-10 ENCOUNTER — Telehealth: Payer: Self-pay | Admitting: Urology

## 2018-08-10 DIAGNOSIS — N390 Urinary tract infection, site not specified: Secondary | ICD-10-CM

## 2018-08-10 NOTE — Addendum Note (Signed)
Addended by: Verlene Mayer A on: 08/10/2018 04:39 PM   Modules accepted: Orders

## 2018-08-10 NOTE — Telephone Encounter (Signed)
Pt. Feels like she has a UTI, has burning and frequent urination and feels she needs an antibiotic. She is willing to come in to give a Ucx if needed.

## 2018-08-10 NOTE — Telephone Encounter (Signed)
Per Dr. Diamantina Providence patient needs a UA/Culture-lab appointment made. Verbalized understanding.

## 2018-08-11 ENCOUNTER — Other Ambulatory Visit: Payer: Medicare HMO

## 2018-08-11 ENCOUNTER — Other Ambulatory Visit: Payer: Self-pay

## 2018-08-11 DIAGNOSIS — N390 Urinary tract infection, site not specified: Secondary | ICD-10-CM

## 2018-08-11 LAB — URINALYSIS, COMPLETE
Bilirubin, UA: NEGATIVE
Glucose, UA: NEGATIVE
Ketones, UA: NEGATIVE
Nitrite, UA: NEGATIVE
Protein,UA: NEGATIVE
Specific Gravity, UA: 1.025 (ref 1.005–1.030)
Urobilinogen, Ur: 0.2 mg/dL (ref 0.2–1.0)
pH, UA: 7 (ref 5.0–7.5)

## 2018-08-11 LAB — MICROSCOPIC EXAMINATION
Epithelial Cells (non renal): >10 /hpf — ABNORMAL HIGH (ref 0–10)
RBC, Urine: NONE SEEN /hpf (ref 0–2)

## 2018-08-11 NOTE — Progress Notes (Signed)
Per Dr. Diamantina Providence patient was notified that UA today was clear but will send for culture since she is symptomatic. Will call with results

## 2018-08-13 LAB — URINE CULTURE

## 2018-08-29 ENCOUNTER — Other Ambulatory Visit: Payer: Self-pay

## 2018-08-29 ENCOUNTER — Ambulatory Visit
Admission: RE | Admit: 2018-08-29 | Discharge: 2018-08-29 | Disposition: A | Discharge status: Home / Self Care | Payer: Medicare HMO | Source: Ambulatory Visit | Attending: Urology | Admitting: Urology

## 2018-08-29 DIAGNOSIS — N390 Urinary tract infection, site not specified: Secondary | ICD-10-CM

## 2018-08-29 DIAGNOSIS — Z905 Acquired absence of kidney: Secondary | ICD-10-CM | POA: Diagnosis not present

## 2018-08-30 ENCOUNTER — Telehealth: Payer: Self-pay

## 2018-08-30 NOTE — Telephone Encounter (Signed)
Called and advised patient of RUS results. She states she is still taking probiotics and has not hear from UA/CX results. States that she wants to confirm that she is not taking too much of her lactobacillus probiotic as she saw on mychart that this is the bacteria showing on her culture. She was taking 2 pills BID but now is only taking 2 in the morning. Also taking apple cider vinegar. Advised patient I would check with Dr Erlene Quan as requested and will advise with response. Patient satisfied and verbalized understanding.

## 2018-08-30 NOTE — Telephone Encounter (Signed)
Please let the patient know that the lactobacillus in her urine culture in fact did not come from her bladder.  They are most likely from her vagina.  The purpose of taking lactobacillus probiotic is to populate the vagina with these bacteria thus this is a good thing.  In terms of dosing, it seems like what you are doing is fine.  Hollice Espy, MD

## 2018-08-30 NOTE — Telephone Encounter (Signed)
Patient advised as directed and verbalized understanding.

## 2018-08-30 NOTE — Telephone Encounter (Signed)
-----   Message from Hollice Espy, MD sent at 08/29/2018  3:47 PM EDT ----- Renal ultrasound is completely normal (other than surgically absent left kidney).  Great news.    Hollice Espy, MD

## 2018-09-28 DIAGNOSIS — R002 Palpitations: Secondary | ICD-10-CM | POA: Diagnosis not present

## 2018-09-28 DIAGNOSIS — Z720 Tobacco use: Secondary | ICD-10-CM | POA: Insufficient documentation

## 2018-09-28 DIAGNOSIS — Z905 Acquired absence of kidney: Secondary | ICD-10-CM | POA: Diagnosis not present

## 2018-09-28 DIAGNOSIS — I340 Nonrheumatic mitral (valve) insufficiency: Secondary | ICD-10-CM | POA: Diagnosis not present

## 2018-09-28 DIAGNOSIS — I10 Essential (primary) hypertension: Secondary | ICD-10-CM | POA: Diagnosis not present

## 2018-09-28 DIAGNOSIS — I491 Atrial premature depolarization: Secondary | ICD-10-CM | POA: Diagnosis not present

## 2018-09-28 DIAGNOSIS — I519 Heart disease, unspecified: Secondary | ICD-10-CM | POA: Diagnosis not present

## 2018-09-28 DIAGNOSIS — I493 Ventricular premature depolarization: Secondary | ICD-10-CM | POA: Diagnosis not present

## 2018-10-04 ENCOUNTER — Other Ambulatory Visit: Payer: Self-pay

## 2018-10-04 DIAGNOSIS — I10 Essential (primary) hypertension: Secondary | ICD-10-CM

## 2018-10-04 MED ORDER — LOSARTAN POTASSIUM 25 MG PO TABS: 25.0000 mg | ORAL_TABLET | Freq: Two times a day (BID) | ORAL | 4 refills | Status: DC

## 2018-10-06 ENCOUNTER — Other Ambulatory Visit: Payer: Self-pay | Admitting: Nurse Practitioner

## 2018-10-06 DIAGNOSIS — E039 Hypothyroidism, unspecified: Secondary | ICD-10-CM | POA: Diagnosis not present

## 2018-10-06 DIAGNOSIS — I10 Essential (primary) hypertension: Secondary | ICD-10-CM | POA: Diagnosis not present

## 2018-10-06 LAB — LIPID PANEL WITH LDL/HDL RATIO

## 2018-10-07 LAB — CBC
Hematocrit: 38.6 % (ref 34.0–46.6)
Hemoglobin: 12.7 g/dL (ref 11.1–15.9)
MCH: 28.7 pg (ref 26.6–33.0)
MCHC: 32.9 g/dL (ref 31.5–35.7)
MCV: 87 fL (ref 79–97)
Platelets: 217 10*3/uL (ref 150–450)
RBC: 4.42 x10E6/uL (ref 3.77–5.28)
RDW: 12.8 % (ref 11.7–15.4)
WBC: 5.9 10*3/uL (ref 3.4–10.8)

## 2018-10-07 LAB — LIPID PANEL WITH LDL/HDL RATIO
Cholesterol, Total: 172 mg/dL (ref 100–199)
HDL: 64 mg/dL (ref >39)
LDL Calculated: 89 mg/dL (ref 0–99)
LDl/HDL Ratio: 1.4 ratio (ref 0.0–3.2)
Triglycerides: 93 mg/dL (ref 0–149)
VLDL Cholesterol Cal: 19 mg/dL (ref 5–40)

## 2018-10-07 LAB — TSH: TSH: 3.74 u[IU]/mL (ref 0.450–4.500)

## 2018-10-07 LAB — T4, FREE: Free T4: 1.69 ng/dL (ref 0.82–1.77)

## 2018-10-07 LAB — T3: T3, Total: 91 ng/dL (ref 71–180)

## 2018-10-11 ENCOUNTER — Ambulatory Visit: Payer: Self-pay | Admitting: Internal Medicine

## 2018-10-13 ENCOUNTER — Other Ambulatory Visit: Payer: Self-pay

## 2018-10-13 ENCOUNTER — Encounter: Payer: Self-pay | Admitting: Nurse Practitioner

## 2018-10-13 ENCOUNTER — Ambulatory Visit (INDEPENDENT_AMBULATORY_CARE_PROVIDER_SITE_OTHER): Payer: Medicare HMO | Admitting: Nurse Practitioner

## 2018-10-13 VITALS — BP 129/65 | HR 56 | Resp 16 | Ht 63.0 in | Wt 140.2 lb

## 2018-10-13 DIAGNOSIS — B373 Candidiasis of vulva and vagina: Secondary | ICD-10-CM | POA: Diagnosis not present

## 2018-10-13 DIAGNOSIS — R300 Dysuria: Secondary | ICD-10-CM | POA: Diagnosis not present

## 2018-10-13 DIAGNOSIS — L819 Disorder of pigmentation, unspecified: Secondary | ICD-10-CM

## 2018-10-13 DIAGNOSIS — B3731 Acute candidiasis of vulva and vagina: Secondary | ICD-10-CM

## 2018-10-13 LAB — POCT URINALYSIS DIPSTICK
Bilirubin, UA: NEGATIVE
Glucose, UA: NEGATIVE
Ketones, UA: NEGATIVE
Leukocytes, UA: NEGATIVE
Nitrite, UA: NEGATIVE
Protein, UA: NEGATIVE
Spec Grav, UA: 1.02 (ref 1.010–1.025)
Urobilinogen, UA: 0.2 E.U./dL
pH, UA: 6.5 (ref 5.0–8.0)

## 2018-10-13 MED ORDER — TRETINOIN 0.025 % EX CREA: TOPICAL_CREAM | CUTANEOUS | 4 refills | Status: DC

## 2018-10-13 MED ORDER — NYSTATIN 100000 UNIT/GM EX CREA: 1.0000 "application " | TOPICAL_CREAM | Freq: Two times a day (BID) | CUTANEOUS | 0 refills | Status: DC

## 2018-10-13 MED ORDER — FLUCONAZOLE 150 MG PO TABS: ORAL_TABLET | ORAL | 0 refills | Status: DC

## 2018-10-13 NOTE — Progress Notes (Signed)
[facility] [place], [place] [address]  Internal MEDICINE  Office Visit Note  Patient Name: Amanda Livingston  [id number]  [id number]  Date of Service: 10/13/2018   Pt is here for a sick visit.  Chief Complaint  Patient presents with  . Urinary Tract Infection    itchiness, burning sensation upon urination especially later in the day     The patient is here for sick visit. She states she has had some vaginal itching and irritation. This is mostly on outer aspects of the vagina. Sometimes the skin around vagina is irritated as well. Has no itching or irritation inside the vagina. Denies vaginal discharge. Does have some burning when she urinates. Denies abdominal pain, nausea, or fever.        Current Medication:  Outpatient Encounter Medications as of 10/13/2018  Medication Sig Note  . Biotin 1000 MCG tablet Take 1,000 mcg by mouth daily.   . Calcium 500 MG CHEW Chew by mouth daily.   . Cholecalciferol (VITAMIN D3) 1000 units CAPS Take by mouth.   . cloNIDine (CATAPRES) 0.1 MG tablet Take 1 tablet (0.1 mg total) by mouth 2 (two) times daily as needed.   Marland Kitchen estradiol (ESTRACE) 0.5 MG tablet Take 1 tablet (0.5 mg total) by mouth daily. 09/16/2017: Every other day per pt  . fluconazole (DIFLUCAN) 150 MG tablet Take 1 tablet po once. May repeat dose in 3 days as needed for persistent symptoms.   Marland Kitchen levothyroxine (SYNTHROID) 137 MCG tablet Take by mouth.   . losartan (COZAAR) 25 MG tablet Take 1 tablet (25 mg total) by mouth 2 (two) times daily.   . metoprolol succinate (TOPROL-XL) 25 MG 24 hr tablet Take 25 mg by mouth daily. take 1/2 tablet 09/16/2017: Taking 1/2 tab. Every day  . Omega-3 Fatty Acids (FISH OIL) 1000 MG CAPS Take by mouth daily.   Marland Kitchen tretinoin (RETIN-A) 0.025 % cream Apply topically as needed   . vitamin C (ASCORBIC ACID) 500 MG tablet Take 500 mg by mouth daily.   . [DISCONTINUED] fluconazole (DIFLUCAN) 150 MG tablet Take 1 tablet po once. May  repeat dose in 3 days as needed for persistent symptoms.   . [DISCONTINUED] tretinoin (RETIN-A) 0.025 % cream Apply topically as needed   . nystatin cream (MYCOSTATIN) Apply 1 application topically 2 (two) times daily.   . [DISCONTINUED] aspirin EC 81 MG tablet Take 81 mg by mouth 3 (three) times a week.    No facility-administered encounter medications on file as of 10/13/2018.       Medical History: Past Medical History:  Diagnosis Date  . Hypertension   . Osteopenia   . 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[place])    thyroid     Today's Vitals   10/13/18 1104  BP: 129/65  Pulse: (!) 56  Resp: 16  SpO2: 96%  Weight: 140 lb 3.2 oz (63.6 kg)  Height: 5\' 3"  (1.6 m)   Body mass index is 24.84 kg/m.  Review of Systems  Constitutional: Negative for chills, fatigue and unexpected weight change.  HENT: Negative for congestion, rhinorrhea, sneezing and sore throat.   Respiratory: Negative for cough, chest tightness and shortness of breath.   Cardiovascular: Negative for chest pain and palpitations.  Gastrointestinal: Negative for abdominal pain, constipation, diarrhea, nausea and vomiting.  Genitourinary: Positive for dysuria. Negative for flank pain and frequency.       External vaginal itching and irritation  Musculoskeletal: Negative for arthralgias, back pain, joint swelling and neck  pain.  Skin: Negative for rash.  Allergic/Immunologic: Negative.   Neurological: Negative for tremors and numbness.  Hematological: Negative for adenopathy. Does not bruise/bleed easily.  Psychiatric/Behavioral: Negative for behavioral problems and sleep disturbance. The patient is not nervous/anxious.     Physical Exam Vitals signs and nursing note reviewed.  Constitutional:      General: She is not in acute distress.    Appearance: Normal appearance. She is well-developed. She is not diaphoretic.  HENT:     Head: Normocephalic and atraumatic.     Mouth/Throat:     Pharynx: No oropharyngeal exudate.  Eyes:      Pupils: Pupils are equal, round, and reactive to light.  Neck:     Musculoskeletal: Normal range of motion and neck supple.     Thyroid: No thyromegaly.     Vascular: No JVD.     Trachea: No tracheal deviation.  Cardiovascular:     Rate and Rhythm: Normal rate and regular rhythm.     Heart sounds: Normal heart sounds. No murmur. No friction rub. No gallop.   Pulmonary:     Effort: Pulmonary effort is normal. No respiratory distress.     Breath sounds: Normal breath sounds. No wheezing or rales.  Chest:     Chest wall: No tenderness.  Abdominal:     General: Bowel sounds are normal.     Palpations: Abdomen is soft.  Genitourinary:    Comments: Urine sample positive for trace blood. Musculoskeletal: Normal range of motion.  Lymphadenopathy:     Cervical: No cervical adenopathy.  Skin:    General: Skin is warm and dry.  Neurological:     General: No focal deficit present.     Mental Status: She is alert and oriented to person, place, and time.     Cranial Nerves: No cranial nerve deficit.  Psychiatric:        Behavior: Behavior normal.        Thought Content: Thought content normal.        Judgment: Judgment normal.   Assessment/Plan:  1. Dysuria Urine sample positive for race blood only. Will send for culture and sensitivity and adjust antibiotics as indicated  - POCT Urinalysis Dipstick - CULTURE, URINE COMPREHENSIVE  2. Vaginal yeast infection Diflucan should be taken as needed and as prescribed. Add nystatin cream twice daily, apply externally for next 7 to 10 days.  - nystatin cream (MYCOSTATIN); Apply 1 application topically 2 (two) times daily.  Dispense: 30 g; Refill: 0 - fluconazole (DIFLUCAN) 150 MG tablet; Take 1 tablet po once. May repeat dose in 3 days as needed for persistent symptoms.  Dispense: 3 tablet; Refill: 0  3. Hyperpigmentation - tretinoin (RETIN-A) 0.025 % cream; Apply topically as needed  Dispense: 20 g; Refill: 4  General Counseling:  kennede lusk understanding of the findings of todays visit and agrees with plan of treatment. I have discussed any further diagnostic evaluation that may be needed or ordered today. We also reviewed her medications today. she has been encouraged to call the office with any questions or concerns that should arise related to todays visit.    Counseling:  This patient was seen by Leretha Pol FNP Collaboration with Dr Lavera Guise as a part of collaborative care agreement  Orders Placed This Encounter  Procedures  . CULTURE, URINE COMPREHENSIVE  . POCT Urinalysis Dipstick    Meds ordered this encounter  Medications  . tretinoin (RETIN-A) 0.025 % cream    Sig: Apply topically  as needed    Dispense:  20 g    Refill:  4    Order Specific Question:   Supervising Provider    Answer:   Lavera Guise [[id number]]  . nystatin cream (MYCOSTATIN)    Sig: Apply 1 application topically 2 (two) times daily.    Dispense:  30 g    Refill:  0    Order Specific Question:   Supervising Provider    Answer:   Lavera Guise [[id number]]  . fluconazole (DIFLUCAN) 150 MG tablet    Sig: Take 1 tablet po once. May repeat dose in 3 days as needed for persistent symptoms.    Dispense:  3 tablet    Refill:  0    Order Specific Question:   Supervising Provider    Answer:   Lavera Guise [[id number]]    Time spent: 25 Minutes

## 2018-10-14 DIAGNOSIS — L821 Other seborrheic keratosis: Secondary | ICD-10-CM | POA: Diagnosis not present

## 2018-10-14 DIAGNOSIS — D2272 Melanocytic nevi of left lower limb, including hip: Secondary | ICD-10-CM | POA: Diagnosis not present

## 2018-10-14 DIAGNOSIS — D2271 Melanocytic nevi of right lower limb, including hip: Secondary | ICD-10-CM | POA: Diagnosis not present

## 2018-10-14 DIAGNOSIS — L718 Other rosacea: Secondary | ICD-10-CM | POA: Diagnosis not present

## 2018-10-14 DIAGNOSIS — L820 Inflamed seborrheic keratosis: Secondary | ICD-10-CM | POA: Diagnosis not present

## 2018-10-14 DIAGNOSIS — R202 Paresthesia of skin: Secondary | ICD-10-CM | POA: Diagnosis not present

## 2018-10-14 DIAGNOSIS — L448 Other specified papulosquamous disorders: Secondary | ICD-10-CM | POA: Diagnosis not present

## 2018-10-14 DIAGNOSIS — D485 Neoplasm of uncertain behavior of skin: Secondary | ICD-10-CM | POA: Diagnosis not present

## 2018-10-14 DIAGNOSIS — L538 Other specified erythematous conditions: Secondary | ICD-10-CM | POA: Diagnosis not present

## 2018-10-14 DIAGNOSIS — D2262 Melanocytic nevi of left upper limb, including shoulder: Secondary | ICD-10-CM | POA: Diagnosis not present

## 2018-10-16 LAB — CULTURE, URINE COMPREHENSIVE

## 2018-10-19 ENCOUNTER — Other Ambulatory Visit: Payer: Self-pay | Admitting: Radiology

## 2018-10-19 ENCOUNTER — Other Ambulatory Visit: Payer: Self-pay

## 2018-10-19 ENCOUNTER — Encounter: Payer: Self-pay | Admitting: Urology

## 2018-10-19 ENCOUNTER — Ambulatory Visit: Payer: Medicare HMO | Admitting: Urology

## 2018-10-19 VITALS — BP 111/70 | HR 66 | Ht 63.0 in | Wt 138.0 lb

## 2018-10-19 DIAGNOSIS — R300 Dysuria: Secondary | ICD-10-CM

## 2018-10-19 DIAGNOSIS — N329 Bladder disorder, unspecified: Secondary | ICD-10-CM

## 2018-10-19 DIAGNOSIS — N390 Urinary tract infection, site not specified: Secondary | ICD-10-CM | POA: Diagnosis not present

## 2018-10-19 LAB — MICROSCOPIC EXAMINATION
RBC, Urine: NONE SEEN /hpf (ref 0–2)
WBC, UA: NONE SEEN /hpf (ref 0–5)

## 2018-10-19 LAB — URINALYSIS, COMPLETE
Bilirubin, UA: NEGATIVE
Glucose, UA: NEGATIVE
Ketones, UA: NEGATIVE
Leukocytes,UA: NEGATIVE
Nitrite, UA: NEGATIVE
Protein,UA: NEGATIVE
RBC, UA: NEGATIVE
Specific Gravity, UA: 1.015 (ref 1.005–1.030)
Urobilinogen, Ur: 0.2 mg/dL (ref 0.2–1.0)
pH, UA: 5.5 (ref 5.0–7.5)

## 2018-10-19 NOTE — Progress Notes (Signed)
   10/19/18  CC:  Chief Complaint  Patient presents with  . Cysto    HPI: 73 year old female with intermittent urinary symptoms including dysuria and frequency who presents today for cystoscopy.  On initial evaluation, she had a urine cytology in 07/2018 which was negative.  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  She had a renal ultrasound which was also unremarkable.    Blood pressure 111/70, pulse 66, height 5\' 3"  (1.6 m), weight 138 lb (62.6 kg). NED. A&Ox3.   No respiratory distress   Abd soft, NT, ND Normal external genitalia with patent urethral meatus  Cystoscopy Procedure Note  Patient identification was confirmed, informed consent was obtained, and patient was prepped using Betadine solution.  Lidocaine jelly was administered per urethral meatus.    Procedure: - Flexible cystoscope introduced, without any difficulty.   - Thorough search of the bladder revealed:    normal urethral meatus    normal urothelium except for one lesion which pink-tinged plateau like area with some texture on the surface but no discrete fronds, measuring approximately 1 cm on the left lateral bladder wall    no stones    no ulcers     no tumors    no urethral polyps    no trabeculation  - Ureteral orifices were normal in position and appearance.  Post-Procedure: - Patient tolerated the procedure well  Assessment/ Plan:  1. Recurrent UTI - Urinalysis, Complete  2. Dysuria May be related to #3 or #1  3. Lesion of bladder Lesion of the left lateral bladder wall, differential diagnosis includes benign neoplasm versus malignancy  I recommended that she proceed to the operating room for cystoscopy, bladder biopsy and bilateral retrograde pyelogram for further evaluation  Risk and benefits of this were discussed in detail including risk of bleeding, infection, demonstrating structures, amongst others.  All questions were answered.  She is agreeable with this plan.  She  does mention that she gets very nauseous with anesthesia and I have encouraged her to discuss this at with anesthesia preop. - CULTURE, URINE COMPREHENSIVE  Hollice Espy, MD

## 2018-10-19 NOTE — H&P (View-Only) (Signed)
   10/19/18  CC:  Chief Complaint  Patient presents with  . Cysto    HPI: 73 year old female with intermittent urinary symptoms including dysuria and frequency who presents today for cystoscopy.  On initial evaluation, she had a urine cytology in 07/2018 which was negative.  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  She had a renal ultrasound which was also unremarkable.    Blood pressure 111/70, pulse 66, height 5\' 3"  (1.6 m), weight 138 lb (62.6 kg). NED. A&Ox3.   No respiratory distress   Abd soft, NT, ND Normal external genitalia with patent urethral meatus  Cystoscopy Procedure Note  Patient identification was confirmed, informed consent was obtained, and patient was prepped using Betadine solution.  Lidocaine jelly was administered per urethral meatus.    Procedure: - Flexible cystoscope introduced, without any difficulty.   - Thorough search of the bladder revealed:    normal urethral meatus    normal urothelium except for one lesion which pink-tinged plateau like area with some texture on the surface but no discrete fronds, measuring approximately 1 cm on the left lateral bladder wall    no stones    no ulcers     no tumors    no urethral polyps    no trabeculation  - Ureteral orifices were normal in position and appearance.  Post-Procedure: - Patient tolerated the procedure well  Assessment/ Plan:  1. Recurrent UTI - Urinalysis, Complete  2. Dysuria May be related to #3 or #1  3. Lesion of bladder Lesion of the left lateral bladder wall, differential diagnosis includes benign neoplasm versus malignancy  I recommended that she proceed to the operating room for cystoscopy, bladder biopsy and bilateral retrograde pyelogram for further evaluation  Risk and benefits of this were discussed in detail including risk of bleeding, infection, demonstrating structures, amongst others.  All questions were answered.  She is agreeable with this plan.  She  does mention that she gets very nauseous with anesthesia and I have encouraged her to discuss this at with anesthesia preop. - CULTURE, URINE COMPREHENSIVE  Hollice Espy, MD

## 2018-10-20 ENCOUNTER — Telehealth: Payer: Self-pay | Admitting: Urology

## 2018-10-20 ENCOUNTER — Other Ambulatory Visit: Payer: Self-pay | Admitting: Nurse Practitioner

## 2018-10-20 DIAGNOSIS — E876 Hypokalemia: Secondary | ICD-10-CM | POA: Diagnosis not present

## 2018-10-20 NOTE — Telephone Encounter (Signed)
Spoke with patient and reviewed dr. Cherrie Gauze note and explained why we are doing a biopsy in the OR

## 2018-10-20 NOTE — Telephone Encounter (Signed)
Pt called and was upset ion the phone, she has some questions about her appt yesterday. She requests a call back please.

## 2018-10-21 LAB — BASIC METABOLIC PANEL
BUN/Creatinine Ratio: 32 — ABNORMAL HIGH (ref 12–28)
BUN: 22 mg/dL (ref 8–27)
CO2: 24 mmol/L (ref 20–29)
Calcium: 9.7 mg/dL (ref 8.7–10.3)
Chloride: 101 mmol/L (ref 96–106)
Creatinine, Ser: 0.68 mg/dL (ref 0.57–1.00)
GFR calc Af Amer: 99 mL/min/{1.73_m2} (ref >59)
GFR calc non Af Amer: 86 mL/min/{1.73_m2} (ref >59)
Glucose: 101 mg/dL — ABNORMAL HIGH (ref 65–99)
Potassium: 4.3 mmol/L (ref 3.5–5.2)
Sodium: 140 mmol/L (ref 134–144)

## 2018-10-22 LAB — CULTURE, URINE COMPREHENSIVE

## 2018-10-24 ENCOUNTER — Other Ambulatory Visit: Payer: Self-pay

## 2018-10-24 ENCOUNTER — Telehealth: Payer: Self-pay | Admitting: Nurse Practitioner

## 2018-10-24 ENCOUNTER — Encounter: Payer: Self-pay | Admitting: Nurse Practitioner

## 2018-10-24 ENCOUNTER — Ambulatory Visit (INDEPENDENT_AMBULATORY_CARE_PROVIDER_SITE_OTHER): Payer: Medicare HMO | Admitting: Nurse Practitioner

## 2018-10-24 VITALS — BP 126/76 | HR 66 | Resp 16 | Ht 64.0 in | Wt 137.0 lb

## 2018-10-24 DIAGNOSIS — I10 Essential (primary) hypertension: Secondary | ICD-10-CM | POA: Diagnosis not present

## 2018-10-24 DIAGNOSIS — R319 Hematuria, unspecified: Secondary | ICD-10-CM | POA: Diagnosis not present

## 2018-10-24 DIAGNOSIS — R1110 Vomiting, unspecified: Secondary | ICD-10-CM | POA: Insufficient documentation

## 2018-10-24 DIAGNOSIS — N390 Urinary tract infection, site not specified: Secondary | ICD-10-CM

## 2018-10-24 DIAGNOSIS — N959 Unspecified menopausal and perimenopausal disorder: Secondary | ICD-10-CM | POA: Diagnosis not present

## 2018-10-24 DIAGNOSIS — R112 Nausea with vomiting, unspecified: Secondary | ICD-10-CM

## 2018-10-24 MED ORDER — PROMETHAZINE HCL 25 MG RE SUPP: 25.0000 mg | Freq: Four times a day (QID) | RECTAL | 0 refills | Status: DC | PRN

## 2018-10-24 MED ORDER — ESTRADIOL 0.5 MG PO TABS: 0.5000 mg | ORAL_TABLET | Freq: Every day | ORAL | 3 refills | Status: DC

## 2018-10-24 NOTE — Telephone Encounter (Signed)
Estradiol 0.5MG  tablets  Approvedtoday PA Case: [id number], Status: Approved, Coverage Starts on: 05/09/2018, Coverage Ends on: 05/11/2019.  Promethazine HCl 25MG  RE SUPP Approvedtoday PA Case: [id number], Status: Approved, Coverage Starts on: 05/09/2018, Coverage Ends on: 05/11/2019  Pharmacy contacted

## 2018-10-24 NOTE — Progress Notes (Signed)
[facility] [place], [place] [address]  Internal MEDICINE  Office Visit Note  Patient Name: Amanda Livingston  [id number]  [id number]  Date of Service: 10/24/2018  Chief Complaint  Patient presents with  . Medical Management of Chronic Issues    6 month routine follow up, pt want to discuss upcoming procedure, medication refills  . Hypertension    The patient is here for routine follow up. Concerned about recent appointment with urologist. Had routine cystoscopy last week. They found cells in the bladder which were abnormal. She has to have more involved procedure next Monday. She is very concerned about hyperemesis after being put to sleep. She is asking to have phenergan suppositories for after the procedure if she develops severe vomiting. This is the only thing that has helped her after having past surgical procedures. States that she tolerates "twilight" sleep well. Having colonoscopies did not give her any trouble.  Was treated for UTI at her last visit. Symptoms are resolved. Blood pressure is well managed.       Current Medication: Outpatient Encounter Medications as of 10/24/2018  Medication Sig Note  . Biotin 1000 MCG tablet Take 1,000 mcg by mouth daily.   . Calcium 500 MG CHEW Chew by mouth daily.   . Cholecalciferol (VITAMIN D3) 1000 units CAPS Take by mouth.   . cloNIDine (CATAPRES) 0.1 MG tablet Take 1 tablet (0.1 mg total) by mouth 2 (two) times daily as needed.   Marland Kitchen estradiol (ESTRACE) 0.5 MG tablet Take 1 tablet (0.5 mg total) by mouth daily.   Marland Kitchen levothyroxine (SYNTHROID) 137 MCG tablet Take by mouth.   . losartan (COZAAR) 25 MG tablet Take 1 tablet (25 mg total) by mouth 2 (two) times daily.   . metoprolol succinate (TOPROL-XL) 25 MG 24 hr tablet Take 25 mg by mouth daily. take 1/2 tablet 09/16/2017: Taking 1/2 tab. Every day  . Omega-3 Fatty Acids (FISH OIL) 1000 MG CAPS Take by mouth daily.   Marland Kitchen tretinoin (RETIN-A) 0.025 % cream Apply  topically as needed   . vitamin C (ASCORBIC ACID) 500 MG tablet Take 500 mg by mouth daily.   . [DISCONTINUED] estradiol (ESTRACE) 0.5 MG tablet Take 1 tablet (0.5 mg total) by mouth daily. 09/16/2017: Every other day per pt  . fluconazole (DIFLUCAN) 150 MG tablet Take 1 tablet po once. May repeat dose in 3 days as needed for persistent symptoms. (Patient not taking: Reported on 10/24/2018)   . promethazine (PHENERGAN) 25 MG suppository Place 1 suppository (25 mg total) rectally every 6 (six) hours as needed for nausea or vomiting.    No facility-administered encounter medications on file as of 10/24/2018.     Surgical History: Past Surgical History:  Procedure Laterality Date  . ABDOMINAL HYSTERECTOMY    . ESOPHAGOGASTRODUODENOSCOPY (EGD) WITH PROPOFOL N/A 09/30/2017   Procedure: ESOPHAGOGASTRODUODENOSCOPY (EGD) WITH PROPOFOL;  Surgeon: Virgel Manifold, MD;  Location: [facility] ENDOSCOPY;  Service: Endoscopy;  Laterality: N/A;  . NEPHRECTOMY Left   . THYROIDECTOMY    . TONSILLECTOMY Right     Medical History: Past Medical History:  Diagnosis Date  . Hypertension   . Osteopenia   . 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[place])    thyroid    Family History: Family History  Problem Relation Age of Onset  . Cancer Father     Social History   Socioeconomic History  . Marital status: Divorced    Spouse name: Not on file  . Number of children: Not on  file  . Years of education: Not on file  . Highest education level: Not on file  Occupational History  . Not on file  Social Needs  . Financial resource strain: Not on file  . Food insecurity    Worry: Not on file    Inability: Not on file  . Transportation needs    Medical: Not on file    Non-medical: Not on file  Tobacco Use  . Smoking status: Current Every Day Smoker    Types: Cigarettes    Last attempt to quit: 2004    Years since quitting: 16.4  . Smokeless tobacco: Never Used  . Tobacco comment: 3 or 4 cigarettes a day due to stress, trying  to quit  Substance and Sexual Activity  . Alcohol use: Yes    Alcohol/week: 7.0 standard drinks    Types: 7 Glasses of wine per week    Comment: half a glass full every night  . Drug use: No  . Sexual activity: Not on file  Lifestyle  . Physical activity    Days per week: Not on file    Minutes per session: Not on file  . Stress: Not on file  Relationships  . Social Herbalist on phone: Not on file    Gets together: Not on file    Attends religious service: Not on file    Active member of club or organization: Not on file    Attends meetings of clubs or organizations: Not on file    Relationship status: Not on file  . Intimate partner violence    Fear of current or ex partner: Not on file    Emotionally abused: Not on file    Physically abused: Not on file    Forced sexual activity: Not on file  Other Topics Concern  . Not on file  Social History Narrative  . Not on file      Review of Systems  Constitutional: Negative for chills, fatigue and unexpected weight change.  HENT: Negative for congestion, rhinorrhea, sneezing and sore throat.   Respiratory: Negative for cough, chest tightness and shortness of breath.   Cardiovascular: Negative for chest pain and palpitations.  Gastrointestinal: Negative for abdominal pain, constipation, diarrhea, nausea and vomiting.  Endocrine: Negative for cold intolerance, heat intolerance, polydipsia and polyuria.  Musculoskeletal: Negative for arthralgias, back pain, joint swelling and neck pain.  Skin: Negative for rash.  Neurological: Negative for tremors and numbness.  Hematological: Negative for adenopathy. Does not bruise/bleed easily.  Psychiatric/Behavioral: Negative for behavioral problems and sleep disturbance. The patient is nervous/anxious.     Today's Vitals   10/24/18 1149  BP: 126/76  Pulse: 66  Resp: 16  SpO2: 98%  Weight: 137 lb (62.1 kg)  Height: 5\' 4"  (1.626 m)   Body mass index is 23.52  kg/m.  Physical Exam Vitals signs and nursing note reviewed.  Constitutional:      General: She is not in acute distress.    Appearance: Normal appearance. She is well-developed. She is not diaphoretic.  HENT:     Head: Normocephalic and atraumatic.     Mouth/Throat:     Pharynx: No oropharyngeal exudate.  Eyes:     Pupils: Pupils are equal, round, and reactive to light.  Neck:     Musculoskeletal: Normal range of motion and neck supple.     Thyroid: No thyromegaly.     Vascular: No JVD.     Trachea: No tracheal deviation.  Cardiovascular:     Rate and Rhythm: Normal rate. Rhythm irregular.     Heart sounds: Normal heart sounds. No murmur. No friction rub. No gallop.   Pulmonary:     Effort: Pulmonary effort is normal. No respiratory distress.     Breath sounds: Normal breath sounds. No wheezing or rales.  Chest:     Chest wall: No tenderness.  Abdominal:     General: Bowel sounds are normal.     Palpations: Abdomen is soft.  Musculoskeletal: Normal range of motion.  Lymphadenopathy:     Cervical: No cervical adenopathy.  Skin:    General: Skin is warm and dry.  Neurological:     General: No focal deficit present.     Mental Status: She is alert and oriented to person, place, and time.     Cranial Nerves: No cranial nerve deficit.  Psychiatric:        Attention and Perception: Attention and perception normal.        Mood and Affect: Affect normal. Mood is anxious.        Speech: Speech normal.        Behavior: Behavior normal.        Thought Content: Thought content normal.        Cognition and Memory: Cognition and memory normal.        Judgment: Judgment normal.    Assessment/Plan:  1. Essential hypertension, benign Stable. Continue BP medication as prescribed   2. Non-intractable vomiting with nausea, unspecified vomiting type Promethazine suppositories ordered to treat post-procedural nausea and vomiting. Advised her not to drive or work after taking this  medication as it will likely cause severe nausea and vomiting. She voiced understanding and agreement.  - promethazine (PHENERGAN) 25 MG suppository; Place 1 suppository (25 mg total) rectally every 6 (six) hours as needed for nausea or vomiting.  Dispense: 12 each; Refill: 0  3. Unspecified menopausal and perimenopausal disorder - estradiol (ESTRACE) 0.5 MG tablet; Take 1 tablet (0.5 mg total) by mouth daily.  Dispense: 90 tablet; Refill: 3  4. Urinary tract infection with hematuria, site unspecified Resolved. Upcoming cystoscopy procedure.   General Counseling: bobie caris understanding of the findings of todays visit and agrees with plan of treatment. I have discussed any further diagnostic evaluation that may be needed or ordered today. We also reviewed her medications today. she has been encouraged to call the office with any questions or concerns that should arise related to todays visit.  Hypertension Counseling:   The following hypertensive lifestyle modification were recommended and discussed:  1. Limiting alcohol intake to less than 1 oz/day of ethanol:(24 oz of beer or 8 oz of wine or 2 oz of 100-proof whiskey). 2. Take baby ASA 81 mg daily. 3. Importance of regular aerobic exercise and losing weight. 4. Reduce dietary saturated fat and cholesterol intake for overall cardiovascular health. 5. Maintaining adequate dietary potassium, calcium, and magnesium intake. 6. Regular monitoring of the blood pressure. 7. Reduce sodium intake to less than 100 mmol/day (less than 2.3 gm of sodium or less than 6 gm of sodium choride)   The 'BRAT' diet is suggested, then progress to diet as tolerated as symptoms abate. Call if bloody stools, persistent diarrhea, vomiting, fever or abdominal pain.  This patient was seen by [place] with Dr Lavera Guise as a part of collaborative care agreement  Meds ordered this encounter  Medications  . estradiol (ESTRACE) 0.5 MG  tablet  Sig: Take 1 tablet (0.5 mg total) by mouth daily.    Dispense:  90 tablet    Refill:  3    Order Specific Question:   Supervising Provider    Answer:   Lavera Guise [[id number]]  . promethazine (PHENERGAN) 25 MG suppository    Sig: Place 1 suppository (25 mg total) rectally every 6 (six) hours as needed for nausea or vomiting.    Dispense:  12 each    Refill:  0    Order Specific Question:   Supervising Provider    Answer:   Lavera Guise [[id number]]    Time spent: 52 Minutes      Dr Lavera Guise Internal medicine

## 2018-10-27 ENCOUNTER — Encounter
Admission: RE | Admit: 2018-10-27 | Discharge: 2018-10-27 | Disposition: A | Discharge status: Home / Self Care | Payer: Medicare HMO | Source: Ambulatory Visit | Attending: Urology | Admitting: Urology

## 2018-10-27 ENCOUNTER — Other Ambulatory Visit: Payer: Self-pay

## 2018-10-27 DIAGNOSIS — R001 Bradycardia, unspecified: Secondary | ICD-10-CM | POA: Diagnosis not present

## 2018-10-27 DIAGNOSIS — Z1159 Encounter for screening for other viral diseases: Secondary | ICD-10-CM | POA: Insufficient documentation

## 2018-10-27 DIAGNOSIS — Z01818 Encounter for other preprocedural examination: Secondary | ICD-10-CM | POA: Diagnosis not present

## 2018-10-27 DIAGNOSIS — I10 Essential (primary) hypertension: Secondary | ICD-10-CM | POA: Insufficient documentation

## 2018-10-27 HISTORY — DX: Other specified postprocedural states: Z98.890

## 2018-10-27 HISTORY — DX: Nausea with vomiting, unspecified: R11.2

## 2018-10-27 HISTORY — DX: Cardiac arrhythmia, unspecified: I49.9

## 2018-10-27 HISTORY — DX: Hypothyroidism, unspecified: E03.9

## 2018-10-27 HISTORY — DX: Other complications of anesthesia, initial encounter: T88.59XA

## 2018-10-27 HISTORY — DX: Unspecified osteoarthritis, unspecified site: M19.90

## 2018-10-27 HISTORY — DX: Family history of other specified conditions: Z84.89

## 2018-10-27 NOTE — Patient Instructions (Signed)
Your procedure is scheduled on: Mon 6/22 Report to Day Surgery. To find out your arrival time please call [phone] between 1PM - 3PM on Friday 6/19.  Remember: Instructions that are not followed completely may result in serious medical risk,  up to and including death, or upon the discretion of your surgeon and anesthesiologist your  surgery may need to be rescheduled.     _X__ 1. Do not eat food after midnight the night before your procedure.                 No gum chewing or hard candies. You may drink clear liquids up to 2 hours                 before you are scheduled to arrive for your surgery- DO not drink clear                 liquids within 2 hours of the start of your surgery.                 Clear Liquids include:  water, apple juice without pulp, clear carbohydrate                 drink such as Clearfast of Gatorade, Black Coffee or Tea (Do not add                 anything to coffee or tea).  __X__2.  On the morning of surgery brush your teeth with toothpaste and water, you                may rinse your mouth with mouthwash if you wish.  Do not swallow any toothpaste of mouthwash.     _X__ 3.  No Alcohol for 24 hours before or after surgery.   _X__ 4.  Do Not Smoke or use e-cigarettes For 24 Hours Prior to Your Surgery.                 Do not use any chewable tobacco products for at least 6 hours prior to                 surgery.  ____  5.  Bring all medications with you on the day of surgery if instructed.   _x___  6.  Notify your doctor if there is any change in your medical condition      (cold, fever, infections).     Do not wear jewelry, make-up, hairpins, clips or nail polish. Do not wear lotions, powders, or perfumes. You may wear deodorant. Do not shave 48 hours prior to surgery. Men may shave face and neck. Do not bring valuables to the hospital.    [facility] is not responsible for any belongings or valuables.  Contacts, dentures  or bridgework may not be worn into surgery. Leave your suitcase in the car. After surgery it may be brought to your room. For patients admitted to the hospital, discharge time is determined by your treatment team.   Patients discharged the day of surgery will not be allowed to drive home.   Please read over the following fact sheets that you were given:     __x__ Take these medicines the morning of surgery with A SIP OF WATER:    1. levothyroxine (SYNTHROID) 137 MCG tablet  2. metoprolol succinate (TOPROL-XL) 25 MG 24 hr tablet  3.   4.  5.  6.  ____ Fleet Enema (as directed)   ____ Use  CHG Soap as directed  ____ Use inhalers on the day of surgery  ____ Stop metformin 2 days prior to surgery    ____ Take 1/2 of usual insulin dose the night before surgery. No insulin the morning          of surgery.   ____ Stop Coumadin/Plavix/aspirin on   ____ Stop Anti-inflammatories on    __x__ Stop supplements until after surgery.  Biotin 1000 MCG tablet,vitamin C (ASCORBIC ACID) 500 MG tablet  ____ Bring C-Pap to the hospital.

## 2018-10-28 LAB — NOVEL CORONAVIRUS, NAA (HOSP ORDER, SEND-OUT TO REF LAB; TAT 18-24 HRS): SARS-CoV-2, NAA: NOT DETECTED

## 2018-10-30 MED ORDER — CEFAZOLIN SODIUM-DEXTROSE 2-4 GM/100ML-% IV SOLN
2.0000 g | INTRAVENOUS | Status: AC
  Administered 2018-10-31: 2 g via INTRAVENOUS

## 2018-10-31 ENCOUNTER — Encounter
Admission: RE | Disposition: A | Discharge status: Home / Self Care | Payer: Self-pay | Source: Home / Self Care | Attending: Urology

## 2018-10-31 ENCOUNTER — Ambulatory Visit: Payer: Medicare HMO | Admitting: Certified Registered"

## 2018-10-31 ENCOUNTER — Ambulatory Visit: Payer: Medicare HMO

## 2018-10-31 ENCOUNTER — Other Ambulatory Visit: Payer: Self-pay

## 2018-10-31 ENCOUNTER — Ambulatory Visit
Admission: RE | Admit: 2018-10-31 | Discharge: 2018-10-31 | Disposition: A | Discharge status: Home / Self Care | Payer: Medicare HMO | Attending: Urology | Admitting: Urology

## 2018-10-31 DIAGNOSIS — K449 Diaphragmatic hernia without obstruction or gangrene: Secondary | ICD-10-CM | POA: Insufficient documentation

## 2018-10-31 DIAGNOSIS — D414 Neoplasm of uncertain behavior of bladder: Secondary | ICD-10-CM | POA: Diagnosis not present

## 2018-10-31 DIAGNOSIS — E039 Hypothyroidism, unspecified: Secondary | ICD-10-CM | POA: Insufficient documentation

## 2018-10-31 DIAGNOSIS — R300 Dysuria: Secondary | ICD-10-CM | POA: Insufficient documentation

## 2018-10-31 DIAGNOSIS — Z85850 Personal history of malignant neoplasm of thyroid: Secondary | ICD-10-CM | POA: Insufficient documentation

## 2018-10-31 DIAGNOSIS — D494 Neoplasm of unspecified behavior of bladder: Secondary | ICD-10-CM | POA: Insufficient documentation

## 2018-10-31 DIAGNOSIS — N329 Bladder disorder, unspecified: Secondary | ICD-10-CM | POA: Diagnosis not present

## 2018-10-31 DIAGNOSIS — F17200 Nicotine dependence, unspecified, uncomplicated: Secondary | ICD-10-CM | POA: Insufficient documentation

## 2018-10-31 DIAGNOSIS — Q600 Renal agenesis, unilateral: Secondary | ICD-10-CM | POA: Insufficient documentation

## 2018-10-31 DIAGNOSIS — I10 Essential (primary) hypertension: Secondary | ICD-10-CM | POA: Diagnosis not present

## 2018-10-31 DIAGNOSIS — M1990 Unspecified osteoarthritis, unspecified site: Secondary | ICD-10-CM | POA: Diagnosis not present

## 2018-10-31 DIAGNOSIS — Z87440 Personal history of urinary (tract) infections: Secondary | ICD-10-CM | POA: Insufficient documentation

## 2018-10-31 DIAGNOSIS — E785 Hyperlipidemia, unspecified: Secondary | ICD-10-CM | POA: Diagnosis not present

## 2018-10-31 DIAGNOSIS — N3289 Other specified disorders of bladder: Secondary | ICD-10-CM | POA: Diagnosis not present

## 2018-10-31 DIAGNOSIS — R69 Illness, unspecified: Secondary | ICD-10-CM | POA: Diagnosis not present

## 2018-10-31 HISTORY — PX: CYSTOSCOPY W/ RETROGRADES: SHX1426

## 2018-10-31 HISTORY — PX: CYSTOSCOPY WITH BIOPSY: SHX5122

## 2018-10-31 SURGERY — CYSTOSCOPY, WITH BIOPSY
Anesthesia: General

## 2018-10-31 MED ORDER — OXYCODONE HCL 5 MG PO TABS: 5.0000 mg | ORAL_TABLET | Freq: Once | ORAL | Status: DC | PRN

## 2018-10-31 MED ORDER — FENTANYL CITRATE (PF) 100 MCG/2ML IJ SOLN
INTRAMUSCULAR | Status: AC
  Filled 2018-10-31: qty 2

## 2018-10-31 MED ORDER — PENTAFLUOROPROP-TETRAFLUOROETH EX AERO
INHALATION_SPRAY | CUTANEOUS | Status: AC
  Administered 2018-10-31: 1 via TOPICAL
  Filled 2018-10-31: qty 116

## 2018-10-31 MED ORDER — IOHEXOL 180 MG/ML  SOLN
INTRAMUSCULAR | Status: DC | PRN
  Administered 2018-10-31: 20 mL

## 2018-10-31 MED ORDER — ONDANSETRON HCL 4 MG/2ML IJ SOLN
INTRAMUSCULAR | Status: DC | PRN
  Administered 2018-10-31: 4 mg via INTRAVENOUS

## 2018-10-31 MED ORDER — SCOPOLAMINE 1 MG/3DAYS TD PT72
1.0000 | MEDICATED_PATCH | TRANSDERMAL | Status: DC
  Administered 2018-10-31: 1.5 mg via TRANSDERMAL

## 2018-10-31 MED ORDER — LIDOCAINE HCL (CARDIAC) PF 100 MG/5ML IV SOSY
PREFILLED_SYRINGE | INTRAVENOUS | Status: DC | PRN
  Administered 2018-10-31: 100 mg via INTRAVENOUS

## 2018-10-31 MED ORDER — PROMETHAZINE HCL 25 MG/ML IJ SOLN: 6.2500 mg | INTRAMUSCULAR | Status: DC | PRN

## 2018-10-31 MED ORDER — MIDAZOLAM HCL 2 MG/2ML IJ SOLN
INTRAMUSCULAR | Status: AC
  Filled 2018-10-31: qty 2

## 2018-10-31 MED ORDER — SCOPOLAMINE 1 MG/3DAYS TD PT72
MEDICATED_PATCH | TRANSDERMAL | Status: AC
  Filled 2018-10-31: qty 1

## 2018-10-31 MED ORDER — PROPOFOL 500 MG/50ML IV EMUL
INTRAVENOUS | Status: DC | PRN
  Administered 2018-10-31: 150 ug/kg/min via INTRAVENOUS

## 2018-10-31 MED ORDER — PENTAFLUOROPROP-TETRAFLUOROETH EX AERO
INHALATION_SPRAY | CUTANEOUS | Status: DC | PRN
  Administered 2018-10-31: 1 via TOPICAL

## 2018-10-31 MED ORDER — FAMOTIDINE 20 MG PO TABS
20.0000 mg | ORAL_TABLET | Freq: Once | ORAL | Status: AC
  Administered 2018-10-31: 20 mg via ORAL

## 2018-10-31 MED ORDER — PROPOFOL 10 MG/ML IV BOLUS
INTRAVENOUS | Status: DC | PRN
  Administered 2018-10-31: 100 mg via INTRAVENOUS

## 2018-10-31 MED ORDER — CEFAZOLIN SODIUM-DEXTROSE 2-4 GM/100ML-% IV SOLN
INTRAVENOUS | Status: AC
  Filled 2018-10-31: qty 100

## 2018-10-31 MED ORDER — MIDAZOLAM HCL 2 MG/2ML IJ SOLN
INTRAMUSCULAR | Status: DC | PRN
  Administered 2018-10-31: 1 mg via INTRAVENOUS

## 2018-10-31 MED ORDER — PROPOFOL 10 MG/ML IV BOLUS
INTRAVENOUS | Status: AC
  Filled 2018-10-31: qty 20

## 2018-10-31 MED ORDER — LACTATED RINGERS IV SOLN
INTRAVENOUS | Status: DC
  Administered 2018-10-31 (×2): via INTRAVENOUS

## 2018-10-31 MED ORDER — DEXAMETHASONE SODIUM PHOSPHATE 10 MG/ML IJ SOLN
INTRAMUSCULAR | Status: DC | PRN
  Administered 2018-10-31: 8 mg via INTRAVENOUS

## 2018-10-31 MED ORDER — FENTANYL CITRATE (PF) 100 MCG/2ML IJ SOLN: 25.0000 ug | INTRAMUSCULAR | Status: DC | PRN

## 2018-10-31 MED ORDER — MEPERIDINE HCL 50 MG/ML IJ SOLN: 6.2500 mg | INTRAMUSCULAR | Status: DC | PRN

## 2018-10-31 MED ORDER — FAMOTIDINE 20 MG PO TABS
ORAL_TABLET | ORAL | Status: AC
  Filled 2018-10-31: qty 1

## 2018-10-31 MED ORDER — OXYCODONE HCL 5 MG/5ML PO SOLN: 5.0000 mg | Freq: Once | ORAL | Status: DC | PRN

## 2018-10-31 MED ORDER — OXYBUTYNIN CHLORIDE 5 MG PO TABS: 5.0000 mg | ORAL_TABLET | Freq: Three times a day (TID) | ORAL | 0 refills | Status: DC | PRN

## 2018-10-31 SURGICAL SUPPLY — 24 items
BAG DRAIN CYSTO-URO LG1000N (MISCELLANEOUS) ×3 IMPLANT
BRUSH SCRUB EZ  4% CHG (MISCELLANEOUS) ×1
BRUSH SCRUB EZ 4% CHG (MISCELLANEOUS) ×2 IMPLANT
CATH URETL 5X70 OPEN END (CATHETERS) ×3 IMPLANT
COVER WAND RF STERILE (DRAPES) ×3 IMPLANT
DRAPE UTILITY 15X26 TOWEL STRL (DRAPES) ×3 IMPLANT
DRSG TELFA 4X3 1S NADH ST (GAUZE/BANDAGES/DRESSINGS) ×3 IMPLANT
ELECT REM PT RETURN 9FT ADLT (ELECTROSURGICAL) ×3
ELECTRODE REM PT RTRN 9FT ADLT (ELECTROSURGICAL) ×2 IMPLANT
GLOVE BIO SURGEON STRL SZ 6.5 (GLOVE) ×3 IMPLANT
GOWN STRL REUS W/ TWL LRG LVL3 (GOWN DISPOSABLE) ×4 IMPLANT
GOWN STRL REUS W/TWL LRG LVL3 (GOWN DISPOSABLE) ×2
GUIDEWIRE STR DUAL SENSOR (WIRE) ×3 IMPLANT
KIT TURNOVER CYSTO (KITS) ×3 IMPLANT
NDL SAFETY ECLIPSE 18X1.5 (NEEDLE) ×2 IMPLANT
NEEDLE HYPO 18GX1.5 SHARP (NEEDLE) ×1
PACK CYSTO AR (MISCELLANEOUS) ×3 IMPLANT
SET CYSTO W/LG BORE CLAMP LF (SET/KITS/TRAYS/PACK) ×3 IMPLANT
SET IRRIG Y TYPE TUR BLADDER L (SET/KITS/TRAYS/PACK) ×1 IMPLANT
SOL .9 NS 3000ML IRR  AL (IV SOLUTION) ×1
SOL .9 NS 3000ML IRR UROMATIC (IV SOLUTION) ×2 IMPLANT
SURGILUBE 2OZ TUBE FLIPTOP (MISCELLANEOUS) ×3 IMPLANT
WATER STERILE IRR 1000ML POUR (IV SOLUTION) ×3 IMPLANT
WATER STERILE IRR 3000ML UROMA (IV SOLUTION) ×3 IMPLANT

## 2018-10-31 NOTE — Op Note (Signed)
Date of procedure: 10/31/18  Preoperative diagnosis:  1. Bladder lesion  Postoperative diagnosis:  1. Same as above  Procedure: 1. Cystoscopy 2. Bladder biopsy 3. Bilateral trigger pyelogram  Surgeon: Hollice Espy, MD  Anesthesia: General  Complications: None  Intraoperative findings: Blind-ending left ureter status post nephrectomy.  Unremarkable right retrograde.  Small partially 1 cm bladder lesion on left lateral bladder wall.  EBL: Minimal  Specimens: Bladder biopsy  Drains: None  Indication: Amanda Livingston is a 72 y.o. patient with personal history of intermittent dysuria she was found to have a small bladder lesion on the left lateral bladder wall and cystoscopy.  After reviewing the management options for treatment, she elected to proceed with the above surgical procedure(s). We have discussed the potential benefits and risks of the procedure, side effects of the proposed treatment, the likelihood of the patient achieving the goals of the procedure, and any potential problems that might occur during the procedure or recuperation. Informed consent has been obtained.  Description of procedure:  The patient was taken to the operating room and general anesthesia was induced.  The patient was placed in the dorsal lithotomy position, prepped and draped in the usual sterile fashion, and preoperative antibiotics were administered. A preoperative time-out was performed.   A 21 French scope was advanced per urethra into the bladder.  The bladder is completely inspected and only the small approximate 1 cm bladder lesion which was relatively plateaued-like appearance on the left lateral bladder wall without any specific fronds.  There is no surrounding erythema or any other changes.  Attention was first turned to the left ureteral orifice.  The UO was relatively tight but ultimately able to be cannulated with a 5 [place] open-ended ureteral catheter.  Gentle retrograde pyelogram on  this side revealed a relatively atretic blind-ending ureter to the level of the mid ureter without the left kidney status post nephrectomy.  On the right side, her retrograde pyelogram was normal left presumably the same procedure without any hydronephrosis or filling defects.  Next, cocoa biopsy forceps were used to completely resect the bladder lesion in question on the left lateral bladder wall and 2 bites.  There is no residual tumor appreciated.  The base of this was then fulgurated using Bugbee electrocautery.  Hemostasis was excellent.  The patient's bladder was then drained.  She was then repositioned in the supine position, reversed anesthesia, and taken to the PACU in stable and stable condition.  Plan: Patient will follow-up next week for pathology results.  All questions were answered and her daughter was called.  Hollice Espy, M.D.

## 2018-10-31 NOTE — Discharge Instructions (Signed)
Transurethral Resection of Bladder Tumor (TURBT) or Bladder Biopsy ° ° °Definition: ° Transurethral Resection of the Bladder Tumor is a surgical procedure used to diagnose and remove tumors within the bladder. TURBT is the most common treatment for early stage bladder cancer. ° °General instructions: °   ° Your recent bladder surgery requires very little post hospital care but some definite precautions. ° °Despite the fact that no skin incisions were used, the area around the bladder incisions are raw and covered with scabs to promote healing and prevent bleeding. Certain precautions are needed to insure that the scabs are not disturbed over the next 2-4 weeks while the healing proceeds. ° °Because the raw surface inside your bladder and the irritating effects of urine you may expect frequency of urination and/or urgency (a stronger desire to urinate) and perhaps even getting up at night more often. This will usually resolve or improve slowly over the healing period. You may see some blood in your urine over the first 6 weeks. Do not be alarmed, even if the urine was clear for a while. Get off your feet and drink lots of fluids until clearing occurs. If you start to pass clots or don't improve call us. ° °Diet: ° °You may return to your normal diet immediately. Because of the raw surface of your bladder, alcohol, spicy foods, foods high in acid and drinks with caffeine may cause irritation or frequency and should be used in moderation. To keep your urine flowing freely and avoid constipation, drink plenty of fluids during the day (8-10 glasses). Tip: Avoid cranberry juice because it is very acidic. ° °Activity: ° °Your physical activity doesn't need to be restricted. However, if you are very active, you may see some blood in the urine. We suggest that you reduce your activity under the circumstances until the bleeding has stopped. ° °Bowels: ° °It is important to keep your bowels regular during the postoperative  period. Straining with bowel movements can cause bleeding. A bowel movement every other day is reasonable. Use a mild laxative if needed, such as milk of magnesia 2-3 tablespoons, or 2 Dulcolax tablets. Call if you continue to have problems. If you had been taking narcotics for pain, before, during or after your surgery, you may be constipated. Take a laxative if necessary. ° ° ° °Medication: ° °You should resume your pre-surgery medications unless told not to. In addition you may be given an antibiotic to prevent or treat infection. Antibiotics are not always necessary. All medication should be taken as prescribed until the bottles are finished unless you are having an unusual reaction to one of the drugs. ° ° °[facility] °[address], [address] °[place], [place] [address] °[phone] ° ° °AMBULATORY SURGERY  °DISCHARGE INSTRUCTIONS ° ° °1) The drugs that you were given will stay in your system until tomorrow so for the next 24 hours you should not: ° °A) Drive an automobile °B) Make any legal decisions °C) Drink any alcoholic beverage ° ° °2) You may resume regular meals tomorrow.  Today it is better to start with liquids and gradually work up to solid foods. ° °You may eat anything you prefer, but it is better to start with liquids, then soup and crackers, and gradually work up to solid foods. ° ° °3) Please notify your doctor immediately if you have any unusual bleeding, trouble breathing, redness and pain at the surgery site, drainage, fever, or pain not relieved by medication. ° ° ° °4)   Additional Instructions: ° ° ° ° ° ° ° °Please contact your physician with any problems or Same Day Surgery at [phone], Monday through Friday 6 am to 4 pm, or [facility] number at [phone]. ° ° ° ° °

## 2018-10-31 NOTE — Transfer of Care (Signed)
Immediate Anesthesia Transfer of Care Note  Patient: Amanda Livingston  Procedure(s) Performed: Procedure(s): CYSTOSCOPY WITH Bladder BIOPSY (N/A) CYSTOSCOPY WITH RETROGRADE PYELOGRAM (Bilateral)  Patient Location: PACU  Anesthesia Type:General  Level of Consciousness: sedated  Airway & Oxygen Therapy: Patient Spontanous Breathing and Patient connected to face mask oxygen  Post-op Assessment: Report given to RN and Post -op Vital signs reviewed and stable  Post vital signs: Reviewed and stable  Last Vitals:  Vitals:   10/31/18 1346 10/31/18 1515  BP: (!) 150/70 (!) 150/71  Pulse:  62  Resp: 14 (!) 8  Temp:  (!) 36.1 C  SpO2:  323%    Complications: No apparent anesthesia complications

## 2018-10-31 NOTE — Interval H&P Note (Signed)
History and Physical Interval Note:  10/31/2018 2:03 PM  Amanda Livingston  has presented today for surgery, with the diagnosis of bladder lesion, dysuria.  The various methods of treatment have been discussed with the patient and family. After consideration of risks, benefits and other options for treatment, the patient has consented to  Procedure(s): CYSTOSCOPY WITH Bladder BIOPSY (N/A) CYSTOSCOPY WITH RETROGRADE PYELOGRAM (Bilateral) as a surgical intervention.  The patient's history has been reviewed, patient examined, no change in status, stable for surgery.  I have reviewed the patient's chart and labs.  Questions were answered to the patient's satisfaction.    RRR CTAB   Hollice Espy

## 2018-10-31 NOTE — Anesthesia Preprocedure Evaluation (Addendum)
Anesthesia Evaluation  Patient identified by MRN, date of birth, ID band Patient awake    Reviewed: Allergy & Precautions, NPO status , Patient's Chart, lab work & pertinent test results  History of Anesthesia Complications (+) PONV and history of anesthetic complications  Airway Mallampati: II  TM Distance: >3 FB Neck ROM: Full    Dental  (+) Implants   Pulmonary neg sleep apnea, neg COPD, Current Smoker,    breath sounds clear to auscultation- rhonchi (-) wheezing      Cardiovascular hypertension, Pt. on medications (-) CAD, (-) Past MI, (-) Cardiac Stents and (-) CABG  Rhythm:Regular Rate:Normal - Systolic murmurs and - Diastolic murmurs    Neuro/Psych neg Seizures negative neurological ROS  negative psych ROS   GI/Hepatic Neg liver ROS, hiatal hernia,   Endo/Other  neg diabetesHypothyroidism   Renal/GU negative Renal ROS     Musculoskeletal  (+) Arthritis ,   Abdominal (+) - obese,   Peds  Hematology negative hematology ROS (+)   Anesthesia Other Findings Past Medical History: No date: Arthritis No date: Complication of anesthesia No date: Dysrhythmia No date: Family history of adverse reaction to anesthesia     Comment:  PONV No date: Hypertension No date: Hypothyroidism No date: Osteopenia No date: PONV (postoperative nausea and vomiting) No date: 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HCC)     Comment:  thyroid   Reproductive/Obstetrics                             Anesthesia Physical Anesthesia Plan  ASA: III  Anesthesia Plan: General   Post-op Pain Management:    Induction: Intravenous  PONV Risk Score and Plan: 2 and Ondansetron, TIVA, Scopolamine patch - Pre-op and Propofol infusion  Airway Management Planned: LMA  Additional Equipment:   Intra-op Plan:   Post-operative Plan:   Informed Consent: I have reviewed the patients History and Physical, chart, labs and discussed the  procedure including the risks, benefits and alternatives for the proposed anesthesia with the patient or authorized representative who has indicated his/her understanding and acceptance.     Dental advisory given  Plan Discussed with: CRNA and Anesthesiologist  Anesthesia Plan Comments:         Anesthesia Quick Evaluation

## 2018-10-31 NOTE — Anesthesia Postprocedure Evaluation (Signed)
Anesthesia Post Note  Patient: Amanda Livingston  Procedure(s) Performed: CYSTOSCOPY WITH Bladder BIOPSY (N/A ) CYSTOSCOPY WITH RETROGRADE PYELOGRAM (Bilateral )  Patient location during evaluation: PACU Anesthesia Type: General Level of consciousness: awake and alert Pain management: pain level controlled Vital Signs Assessment: post-procedure vital signs reviewed and stable Respiratory status: spontaneous breathing and respiratory function stable Cardiovascular status: stable Anesthetic complications: no     Last Vitals:  Vitals:   10/31/18 1600 10/31/18 1610  BP: 137/72 139/74  Pulse: 60 (!) 53  Resp: 16   Temp: (!) 36.2 C 36.5 C  SpO2: 100% 97%    Last Pain:  Vitals:   10/31/18 1610  TempSrc: Temporal  PainSc: 3                  KEPHART,WILLIAM K

## 2018-10-31 NOTE — Anesthesia Post-op Follow-up Note (Signed)
Anesthesia QCDR form completed.        

## 2018-11-01 ENCOUNTER — Encounter: Payer: Self-pay | Admitting: Urology

## 2018-11-02 ENCOUNTER — Telehealth: Payer: Self-pay | Admitting: Urology

## 2018-11-02 LAB — SURGICAL PATHOLOGY

## 2018-11-02 NOTE — Telephone Encounter (Signed)
Spoke to patient and informed her that walking is ok and she normally does small arm exercises and that will be ok as well. Patient states she had red cheeks that were hot to the touch yesterday but it has gone back to normal. I informed her this is a common reaction from Anesthesia. Patient voiced understanding.

## 2018-11-02 NOTE — Telephone Encounter (Signed)
Patient called the office this morning with questions about post op and recovery, specifically when she can resume normal activity and exercise.   She is requesting a call back from the clinical staff.

## 2018-11-03 NOTE — Progress Notes (Signed)
Please review

## 2018-11-04 ENCOUNTER — Telehealth: Payer: Self-pay | Admitting: *Deleted

## 2018-11-04 NOTE — Telephone Encounter (Addendum)
Patient informed-verbalized understanding.    ----- Message from Hollice Espy, MD sent at 11/04/2018  1:37 PM EDT ----- Please let this patient know that I got her pathology results back and the lesion is called a neoplasm of low malignant potential meaning its essentially benign.  We will go into details about what it is at her follow-up visit next week.  Hollice Espy, MD

## 2018-11-09 ENCOUNTER — Other Ambulatory Visit: Payer: Self-pay

## 2018-11-09 ENCOUNTER — Encounter: Payer: Self-pay | Admitting: Urology

## 2018-11-09 ENCOUNTER — Ambulatory Visit (INDEPENDENT_AMBULATORY_CARE_PROVIDER_SITE_OTHER): Payer: Medicare HMO | Admitting: Urology

## 2018-11-09 VITALS — BP 136/76 | HR 64 | Ht 63.0 in | Wt 133.0 lb

## 2018-11-09 DIAGNOSIS — R300 Dysuria: Secondary | ICD-10-CM

## 2018-11-09 DIAGNOSIS — Z720 Tobacco use: Secondary | ICD-10-CM | POA: Diagnosis not present

## 2018-11-09 DIAGNOSIS — D414 Neoplasm of uncertain behavior of bladder: Secondary | ICD-10-CM

## 2018-11-09 LAB — URINALYSIS, COMPLETE
Bilirubin, UA: NEGATIVE
Glucose, UA: NEGATIVE
Ketones, UA: NEGATIVE
Nitrite, UA: NEGATIVE
Protein,UA: NEGATIVE
Specific Gravity, UA: 1.02 (ref 1.005–1.030)
Urobilinogen, Ur: 0.2 mg/dL (ref 0.2–1.0)
pH, UA: 5.5 (ref 5.0–7.5)

## 2018-11-09 LAB — MICROSCOPIC EXAMINATION: RBC, Urine: >30 /hpf — AB (ref 0–2)

## 2018-11-09 NOTE — Progress Notes (Signed)
11/09/2018 11:32 AM   Amanda Livingston 01-Feb-1946 [id number]  Referring provider: Lavera Guise, [place],  [place] [address]  Chief Complaint  Patient presents with  . Post-op Follow-up    HPI: 73 year old female with a history of solitary kidney, chronic dysuria found to have an incidental bladder lesion on cystoscopy.  She was taken to the operating room on 10/31/2018 for bladder biopsy/resection of this small 1 cm lesion.  Bilateral retrogrades unremarkable.  Surgical pathology consistent with PUNLMP.  She returns today primarily to discuss these results.  She is extremely anxious about the findings.  She does have a history of chronic intermittent urinary symptoms which do not appear to be infectious.  She reports after the biopsy, she had some dysuria and frequency for a few days which subsided.  Over the past day or 2, her symptoms have a mild amount of dysuria today.  She is worried she may have an infection.  No fevers or chills.  No clots.  She does have a personal history of smoking, quit about 15 years ago but recently resumed.   PMH: Past Medical History:  Diagnosis Date  . Arthritis   . Complication of anesthesia   . Dysrhythmia   . Family history of adverse reaction to anesthesia    PONV  . Hypertension   . Hypothyroidism   . Osteopenia   . PONV (postoperative nausea and vomiting)   . Thyroid cancer [facility])    thyroid    Surgical History: Past Surgical History:  Procedure Laterality Date  . ABDOMINAL HYSTERECTOMY    . colonscopy  2015  . CYSTOSCOPY W/ RETROGRADES Bilateral 10/31/2018   Procedure: CYSTOSCOPY WITH RETROGRADE PYELOGRAM;  Surgeon: Hollice Espy, MD;  Location: [facility] ORS;  Service: Urology;  Laterality: Bilateral;  . CYSTOSCOPY WITH BIOPSY N/A 10/31/2018   Procedure: CYSTOSCOPY WITH Bladder BIOPSY;  Surgeon: Hollice Espy, MD;  Location: [facility] ORS;  Service: Urology;  Laterality: N/A;  . ESOPHAGOGASTRODUODENOSCOPY (EGD) WITH  PROPOFOL N/A 09/30/2017   Procedure: ESOPHAGOGASTRODUODENOSCOPY (EGD) WITH PROPOFOL;  Surgeon: Virgel Manifold, MD;  Location: [facility] ENDOSCOPY;  Service: Endoscopy;  Laterality: N/A;  . NEPHRECTOMY Left   . THYROIDECTOMY    . TONSILLECTOMY Right     Home Medications:  Allergies as of 11/09/2018      Reactions   Nitrofurantoin Rash      Medication List       Accurate as of November 09, 2018 11:59 PM. If you have any questions, ask your nurse or doctor.        STOP taking these medications   fluconazole 150 MG tablet Commonly known as: DIFLUCAN Stopped by: Hollice Espy, MD   promethazine 25 MG suppository Commonly known as: Phenergan Stopped by: Hollice Espy, MD     TAKE these medications   Biotin 1000 MCG tablet Take 1,000 mcg by mouth daily.   Calcium 500 MG Chew Chew 500 mg by mouth daily.   cloNIDine 0.1 MG tablet Commonly known as: Catapres Take 1 tablet (0.1 mg total) by mouth 2 (two) times daily as needed.   estradiol 0.5 MG tablet Commonly known as: ESTRACE Take 1 tablet (0.5 mg total) by mouth daily.   losartan 25 MG tablet Commonly known as: COZAAR Take 1 tablet (25 mg total) by mouth 2 (two) times daily.   metoprolol succinate 25 MG 24 hr tablet Commonly known as: TOPROL-XL Take 12.5 mg by mouth daily.   oxybutynin 5 MG tablet Commonly known as:  DITROPAN Take 1 tablet (5 mg total) by mouth every 8 (eight) hours as needed for bladder spasms.   Synthroid 137 MCG tablet Generic drug: levothyroxine Take 137 mcg by mouth daily before breakfast.   tretinoin 0.025 % cream Commonly known as: RETIN-A Apply topically as needed What changed:   how much to take  how to take this  when to take this  additional instructions   vitamin C 500 MG tablet Commonly known as: ASCORBIC ACID Take 500 mg by mouth daily.   Vitamin D3 25 MCG (1000 UT) Caps Take 1,000 Units by mouth every other day.       Allergies:  Allergies  Allergen Reactions  .  Nitrofurantoin Rash    Family History: Family History  Problem Relation Age of Onset  . Cancer Father     Social History:  reports that she has been smoking cigarettes. She has been smoking about 0.25 packs per day. She has never used smokeless tobacco. She reports current alcohol use of about 7.0 standard drinks of alcohol per week. She reports that she does not use drugs.  ROS: UROLOGY Frequent Urination?: Yes Hard to postpone urination?: No Burning/pain with urination?: Yes Get up at night to urinate?: Yes Leakage of urine?: No Urine stream starts and stops?: No Trouble starting stream?: Yes Do you have to strain to urinate?: Yes Blood in urine?: No Urinary tract infection?: Yes Sexually transmitted disease?: No Injury to kidneys or bladder?: No Painful intercourse?: No Weak stream?: No Currently pregnant?: No Vaginal bleeding?: No Last menstrual period?: n  Gastrointestinal Nausea?: No Vomiting?: No Indigestion/heartburn?: No Diarrhea?: No Constipation?: Yes  Constitutional Fever: No Night sweats?: No Weight loss?: No Fatigue?: No  Skin Skin rash/lesions?: No Itching?: No  Eyes Blurred vision?: No Double vision?: No  Ears/Nose/Throat Sore throat?: No Sinus problems?: No  Hematologic/Lymphatic Swollen glands?: No Easy bruising?: No  Cardiovascular Leg swelling?: No Chest pain?: No  Respiratory Cough?: No Shortness of breath?: No  Endocrine Excessive thirst?: No  Musculoskeletal Back pain?: No Joint pain?: No  Neurological Headaches?: No Dizziness?: No  Psychologic Depression?: No Anxiety?: No  Physical Exam: BP 136/76   Pulse 64   Ht 5\' 3"  (1.6 m)   Wt 133 lb (60.3 kg)   BMI 23.56 kg/m   Constitutional:  Alert and oriented, No acute distress. HEENT: [place] AT, moist mucus membranes.  Trachea midline, no masses. Cardiovascular: No clubbing, cyanosis, or edema. Respiratory: Normal respiratory effort, no increased work of  breathing.. Skin: No rashes, bruises or suspicious lesions. Neurologic: Grossly intact, no focal deficits, moving all 4 extremities. Psychiatric: Normal mood and affect.  Laboratory Data: Lab Results  Component Value Date   WBC 5.9 10/06/2018   HGB 12.7 10/06/2018   HCT 38.6 10/06/2018   MCV 87 10/06/2018   PLT 217 10/06/2018    Lab Results  Component Value Date   CREATININE 0.68 10/20/2018   Urinalysis Results for orders placed or performed in visit on 11/09/18  Microscopic Examination   URINE  Result Value Ref Range   WBC, UA 6-10 (A) 0 - 5 /hpf   RBC >30 (A) 0 - 2 /hpf   Epithelial Cells (non renal) 0-10 0 - 10 /hpf   Bacteria, UA Few None seen/Few  Urinalysis, Complete  Result Value Ref Range   Specific Gravity, UA 1.020 1.005 - 1.030   pH, UA 5.5 5.0 - 7.5   Color, UA Yellow Yellow   Appearance Ur Cloudy (A) Clear  Leukocytes,UA Trace (A) Negative   Protein,UA Negative Negative/Trace   Glucose, UA Negative Negative   Ketones, UA Negative Negative   RBC, UA 2+ (A) Negative   Bilirubin, UA Negative Negative   Urobilinogen, Ur 0.2 0.2 - 1.0 mg/dL   Nitrite, UA Negative Negative   Microscopic Examination See below:      Assessment & Plan:    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 Dysuria Intermittent dysuria with negative urine cultures UA today appears most consistent with recent cystoscopic intervention We will send off culture to rule out infection although unlikely Encouraged Pyridium as needed as well as hydration - Urinalysis, Complete - CULTURE, URINE COMPREHENSIVE  3. Tobacco abuse Greater than 5 minutes discussing causative relationship  between tobacco and bladder cancer Strongly recommend cessation of abuse of tobacco products to which she is agreeable Offered assistance, declined   Return in about 1 year (around 11/09/2019) for cysto.  Hollice Espy, MD  [facility] [address] [place], [place] [address] [phone]

## 2018-11-10 ENCOUNTER — Telehealth: Payer: Self-pay | Admitting: Urology

## 2018-11-10 NOTE — Telephone Encounter (Signed)
Called pt informed her of the information below. Pt gave verbal understanding.  

## 2018-11-10 NOTE — Telephone Encounter (Signed)
Patient called the office today with questions about her visit with Dr. Erlene Quan yesterday.  During the visit, Dr. Erlene Quan talked about taking a look at her urethra, but did not mention it any more.  She is wanting to know if Dr. Erlene Quan found any issues with her urethra.  I advised her that Dr. Erlene Quan had not completed her note yet and that Dr. Erlene Quan was not in the clinic today, but it might be next week before we can get back to her.  She can be reached at [phone].

## 2018-11-10 NOTE — Telephone Encounter (Signed)
Everything was fine.  Her ureters were fine.   Hollice Espy, MD

## 2018-11-10 NOTE — Telephone Encounter (Signed)
Spoke to patient and she stated you were going to look at the Ureter to make sure the kidney and ureter is open and everything is ok. She states she forgot to mention it in the office yesterday. She is questioning it. Please advise.

## 2018-11-12 LAB — CULTURE, URINE COMPREHENSIVE

## 2018-11-14 ENCOUNTER — Telehealth: Payer: Self-pay | Admitting: *Deleted

## 2018-11-14 NOTE — Telephone Encounter (Addendum)
Patient informed-verbalized understanding  ----- Message from Hollice Espy, MD sent at 11/14/2018  7:51 AM EDT ----- No UTI.  Bacteria was from vagina, likely not bladder.  No need to treat.  Hollice Espy, MD

## 2018-11-29 ENCOUNTER — Ambulatory Visit (INDEPENDENT_AMBULATORY_CARE_PROVIDER_SITE_OTHER): Payer: Medicare HMO | Admitting: Gastroenterology

## 2018-11-29 ENCOUNTER — Encounter: Payer: Self-pay | Admitting: Gastroenterology

## 2018-11-29 ENCOUNTER — Other Ambulatory Visit: Payer: Self-pay

## 2018-11-29 VITALS — BP 121/77 | HR 60 | Temp 98.7°F | Ht 63.0 in | Wt 138.2 lb

## 2018-11-29 DIAGNOSIS — K5900 Constipation, unspecified: Secondary | ICD-10-CM

## 2018-11-29 NOTE — Patient Instructions (Signed)

## 2018-11-29 NOTE — Progress Notes (Signed)
[name], MD [address]  [place]  [place], [place] [address]  Main: [phone]  Fax: [phone]   Primary Care Physician: Lavera Guise, MD   Chief Complaint  Patient presents with  . Follow-up    Esophagitis    HPI: Amanda Livingston is a 73 y.o. female with previous history of grade a esophagitis here for follow-up.  Patient has intentionally lost weight by eating healthy and states this has completely resolved her reflux symptoms.  She is not on any PPI therapy.  No heartburn, dysphagia, abdominal pain, blood in stool.  Patient reports constipation and attributes this to change in her diet habits.  However, when she takes stool softeners it helps, but she is back to constipation once she stops this.  Has been trying to eat fruits and vegetables.  EGD May 2019 with grade a esophagitis with biopsies at the GE junction reporting changes of reflux.  Nonspecific gastritis noticed in the stomach biopsies  EGD February 2015 by Dr. Allen Norris, for heartburn, showed grade a esophagitis, medium hiatal hernia, gastric erythema, biopsies were done.  Which showed reactive foveolar hyperplasia, and mild superficial vascular congestion.  Negative for H. pylori. Last colonoscopy, February 2015 for screening by Dr. Allen Norris with a 5 mm descending colon polyp removed, and internal hemorrhoids.  Biopsy showed hyperplastic polyp.  Repeat was recommended in 10 years if biopsies show hyperplastic polyp.  Current Outpatient Medications  Medication Sig Dispense Refill  . Biotin 1000 MCG tablet Take 1,000 mcg by mouth daily.    . Calcium 500 MG CHEW Chew 500 mg by mouth daily.     . Cholecalciferol (VITAMIN D3) 1000 units CAPS Take 1,000 Units by mouth every other day.     . cloNIDine (CATAPRES) 0.1 MG tablet Take 1 tablet (0.1 mg total) by mouth 2 (two) times daily as needed. 30 tablet 0  . estradiol (ESTRACE) 0.5 MG tablet Take 1 tablet (0.5 mg total) by mouth daily. 90 tablet 3  .  levothyroxine (SYNTHROID) 137 MCG tablet Take 137 mcg by mouth daily before breakfast.     . losartan (COZAAR) 25 MG tablet Take 1 tablet (25 mg total) by mouth 2 (two) times daily. 180 tablet 4  . metoprolol succinate (TOPROL-XL) 25 MG 24 hr tablet Take 12.5 mg by mouth daily.     Marland Kitchen tretinoin (RETIN-A) 0.025 % cream Apply topically as needed (Patient taking differently: Apply 1 application topically daily. ) 20 g 4  . vitamin C (ASCORBIC ACID) 500 MG tablet Take 500 mg by mouth daily.    Marland Kitchen oxybutynin (DITROPAN) 5 MG tablet Take 1 tablet (5 mg total) by mouth every 8 (eight) hours as needed for bladder spasms. (Patient not taking: Reported on 11/29/2018) 30 tablet 0   No current facility-administered medications for this visit.     Allergies as of 11/29/2018 - Review Complete 11/29/2018  Allergen Reaction Noted  . Nitrofurantoin Rash 10/05/2014    ROS:  General: Negative for anorexia, weight loss, fever, chills, fatigue, weakness. ENT: Negative for hoarseness, difficulty swallowing , nasal congestion. CV: Negative for chest pain, angina, palpitations, dyspnea on exertion, peripheral edema.  Respiratory: Negative for dyspnea at rest, dyspnea on exertion, cough, sputum, wheezing.  GI: See history of present illness. GU:  Negative for dysuria, hematuria, urinary incontinence, urinary frequency, nocturnal urination.  Endo: Negative for unusual weight change.    Physical Examination:   BP 121/77   Pulse 60   Temp 98.7 F (  37.1 C)   Ht 5\' 3"  (1.6 m)   Wt 138 lb 3.2 oz (62.7 kg)   BMI 24.48 kg/m   General: Well-nourished, well-developed in no acute distress.  Eyes: No icterus. Conjunctivae pink. Mouth: Oropharyngeal mucosa moist and pink , no lesions erythema or exudate. Neck: Supple, Trachea midline Abdomen: Bowel sounds are normal, nontender, nondistended, no hepatosplenomegaly or masses, no abdominal bruits or hernia , no rebound or guarding.   Extremities: No lower extremity  edema. No clubbing or deformities. Neuro: Alert and oriented x 3.  Grossly intact. Skin: Warm and dry, no jaundice.   Psych: Alert and cooperative, normal mood and affect.   Labs: CMP     Component Value Date/Time   NA 140 10/20/2018 1045   K 4.3 10/20/2018 1045   CL 101 10/20/2018 1045   CO2 24 10/20/2018 1045   GLUCOSE 101 (H) 10/20/2018 1045   GLUCOSE 109 (H) 09/03/2015 0758   BUN 22 10/20/2018 1045   CREATININE 0.68 10/20/2018 1045   CALCIUM 9.7 10/20/2018 1045   PROT 6.7 04/28/2018 0845   ALBUMIN 4.5 04/28/2018 0845   AST 22 04/28/2018 0845   ALT 14 04/28/2018 0845   ALKPHOS 59 04/28/2018 0845   BILITOT 0.4 04/28/2018 0845   GFRNONAA 86 10/20/2018 1045   GFRAA 99 10/20/2018 1045   Lab Results  Component Value Date   WBC 5.9 10/06/2018   HGB 12.7 10/06/2018   HCT 38.6 10/06/2018   MCV 87 10/06/2018   PLT 217 10/06/2018    Imaging Studies: Dg C-arm 1-60 Min-no Report  Result Date: 10/31/2018 Fluoroscopy was utilized by the requesting physician.  No radiographic interpretation.    Assessment and Plan:   Amanda Livingston is a 73 y.o. y/o female with previous history of grade a esophagitis due to reflux  Reflux symptoms have completely resolved after patient intentionally lost weight with diet changes and activity  No indication for repeat EGD.  No indication for PPI therapy  Continue eating healthy  Patient reporting constipation High-fiber diet encouraged along with increased water intake Handout given for the same  Patient has Benefiber at home and MiraLAX I have asked her to start taking either Benefiber or MiraLAX, every day or every other day with goal of 1-2 soft bowel movements a day and change the dosage as necessary to prevent constipation and diarrhea and she verbalized understanding  Patient encouraged to call us if constipation does not improve  Screening colonoscopy up-to-date as per HPI  No alarm symptoms present   Dr [name]

## 2018-11-30 DIAGNOSIS — D485 Neoplasm of uncertain behavior of skin: Secondary | ICD-10-CM | POA: Diagnosis not present

## 2018-11-30 DIAGNOSIS — L820 Inflamed seborrheic keratosis: Secondary | ICD-10-CM | POA: Diagnosis not present

## 2018-12-21 DIAGNOSIS — R002 Palpitations: Secondary | ICD-10-CM | POA: Diagnosis not present

## 2018-12-21 DIAGNOSIS — Z720 Tobacco use: Secondary | ICD-10-CM | POA: Diagnosis not present

## 2018-12-21 DIAGNOSIS — I493 Ventricular premature depolarization: Secondary | ICD-10-CM | POA: Diagnosis not present

## 2018-12-21 DIAGNOSIS — I10 Essential (primary) hypertension: Secondary | ICD-10-CM | POA: Diagnosis not present

## 2018-12-21 DIAGNOSIS — I491 Atrial premature depolarization: Secondary | ICD-10-CM | POA: Diagnosis not present

## 2018-12-21 DIAGNOSIS — I519 Heart disease, unspecified: Secondary | ICD-10-CM | POA: Diagnosis not present

## 2018-12-22 DIAGNOSIS — R002 Palpitations: Secondary | ICD-10-CM | POA: Diagnosis not present

## 2018-12-27 ENCOUNTER — Other Ambulatory Visit: Payer: Self-pay

## 2018-12-27 DIAGNOSIS — L819 Disorder of pigmentation, unspecified: Secondary | ICD-10-CM

## 2018-12-27 MED ORDER — TRETINOIN 0.025 % EX CREA: 1.0000 "application " | TOPICAL_CREAM | Freq: Every day | CUTANEOUS | Status: DC | PRN

## 2018-12-27 MED ORDER — TRETINOIN 0.025 % EX CREA: 1.0000 "application " | TOPICAL_CREAM | Freq: Every day | CUTANEOUS | 3 refills | Status: DC | PRN

## 2018-12-28 ENCOUNTER — Telehealth: Payer: Self-pay | Admitting: Nurse Practitioner

## 2018-12-28 DIAGNOSIS — L819 Disorder of pigmentation, unspecified: Secondary | ICD-10-CM

## 2018-12-28 MED ORDER — TRETINOIN 0.025 % EX CREA: 1.0000 "application " | TOPICAL_CREAM | Freq: Every day | CUTANEOUS | 3 refills | Status: DC | PRN

## 2018-12-28 NOTE — Telephone Encounter (Signed)
Prior authorization for medication tretinoin cream was denied by insurance

## 2019-01-03 DIAGNOSIS — R002 Palpitations: Secondary | ICD-10-CM | POA: Diagnosis not present

## 2019-01-04 DIAGNOSIS — I493 Ventricular premature depolarization: Secondary | ICD-10-CM | POA: Diagnosis not present

## 2019-01-04 DIAGNOSIS — I519 Heart disease, unspecified: Secondary | ICD-10-CM | POA: Diagnosis not present

## 2019-01-04 DIAGNOSIS — I10 Essential (primary) hypertension: Secondary | ICD-10-CM | POA: Diagnosis not present

## 2019-01-04 DIAGNOSIS — R002 Palpitations: Secondary | ICD-10-CM | POA: Diagnosis not present

## 2019-01-04 DIAGNOSIS — I491 Atrial premature depolarization: Secondary | ICD-10-CM | POA: Diagnosis not present

## 2019-01-04 DIAGNOSIS — I340 Nonrheumatic mitral (valve) insufficiency: Secondary | ICD-10-CM | POA: Diagnosis not present

## 2019-01-17 DIAGNOSIS — E559 Vitamin D deficiency, unspecified: Secondary | ICD-10-CM | POA: Diagnosis not present

## 2019-01-17 DIAGNOSIS — E890 Postprocedural hypothyroidism: Secondary | ICD-10-CM | POA: Diagnosis not present

## 2019-01-17 DIAGNOSIS — C73 Malignant neoplasm of thyroid gland: Secondary | ICD-10-CM | POA: Diagnosis not present

## 2019-02-01 DIAGNOSIS — R69 Illness, unspecified: Secondary | ICD-10-CM | POA: Diagnosis not present

## 2019-02-16 DIAGNOSIS — R69 Illness, unspecified: Secondary | ICD-10-CM | POA: Diagnosis not present

## 2019-02-17 DIAGNOSIS — E890 Postprocedural hypothyroidism: Secondary | ICD-10-CM | POA: Diagnosis not present

## 2019-02-17 DIAGNOSIS — C73 Malignant neoplasm of thyroid gland: Secondary | ICD-10-CM | POA: Diagnosis not present

## 2019-02-20 DIAGNOSIS — R69 Illness, unspecified: Secondary | ICD-10-CM | POA: Diagnosis not present

## 2019-02-22 DIAGNOSIS — I10 Essential (primary) hypertension: Secondary | ICD-10-CM | POA: Diagnosis not present

## 2019-02-22 DIAGNOSIS — R0789 Other chest pain: Secondary | ICD-10-CM | POA: Diagnosis not present

## 2019-02-22 DIAGNOSIS — I493 Ventricular premature depolarization: Secondary | ICD-10-CM | POA: Diagnosis not present

## 2019-02-22 DIAGNOSIS — R002 Palpitations: Secondary | ICD-10-CM | POA: Diagnosis not present

## 2019-02-22 DIAGNOSIS — I491 Atrial premature depolarization: Secondary | ICD-10-CM | POA: Diagnosis not present

## 2019-03-07 DIAGNOSIS — H2513 Age-related nuclear cataract, bilateral: Secondary | ICD-10-CM | POA: Diagnosis not present

## 2019-03-23 ENCOUNTER — Other Ambulatory Visit: Payer: Self-pay

## 2019-03-23 ENCOUNTER — Encounter: Payer: Self-pay | Admitting: Urology

## 2019-03-23 ENCOUNTER — Ambulatory Visit (INDEPENDENT_AMBULATORY_CARE_PROVIDER_SITE_OTHER): Payer: Medicare HMO | Admitting: Urology

## 2019-03-23 VITALS — BP 123/70 | HR 65 | Ht 63.0 in | Wt 123.0 lb

## 2019-03-23 DIAGNOSIS — N390 Urinary tract infection, site not specified: Secondary | ICD-10-CM

## 2019-03-23 DIAGNOSIS — Z113 Encounter for screening for infections with a predominantly sexual mode of transmission: Secondary | ICD-10-CM | POA: Diagnosis not present

## 2019-03-23 DIAGNOSIS — R69 Illness, unspecified: Secondary | ICD-10-CM | POA: Diagnosis not present

## 2019-03-23 LAB — URINALYSIS, COMPLETE
Bilirubin, UA: NEGATIVE
Glucose, UA: NEGATIVE
Ketones, UA: NEGATIVE
Leukocytes,UA: NEGATIVE
Nitrite, UA: NEGATIVE
Protein,UA: NEGATIVE
RBC, UA: NEGATIVE
Specific Gravity, UA: <1.005 — ABNORMAL LOW (ref 1.005–1.030)
Urobilinogen, Ur: 0.2 mg/dL (ref 0.2–1.0)
pH, UA: 6 (ref 5.0–7.5)

## 2019-03-23 LAB — MICROSCOPIC EXAMINATION
Bacteria, UA: NONE SEEN
RBC: NONE SEEN /hpf (ref 0–2)

## 2019-03-23 NOTE — Progress Notes (Signed)
03/23/2019 8:59 PM   Amanda Livingston 12/04/1945 [id number]  Referring provider: Lavera Guise, [place],  [place] [address]  Chief Complaint  Patient presents with  . 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  She states that she has had on and off burning in the perineal area for some time, but it was worse last week.  She has had "messed" up bowels with constipation and now diarrhea.  Cystex has been helping somewhat.  Patient denies any gross hematuria or suprapubic/flank pain.  Patient denies any fevers, chills, nausea or vomiting.   She is in a sexual relationship with a man who is also sexually active with another woman.  He is not wearing condoms.    UA today is bland.    PMH: Past Medical History:  Diagnosis Date  . Arthritis   . Complication of anesthesia   . Dysrhythmia   . Family history of adverse reaction to anesthesia    PONV  . Hypertension   . Hypothyroidism   . Osteopenia   . PONV (postoperative nausea and vomiting)   . Thyroid cancer [facility])    thyroid    Surgical History: Past Surgical History:  Procedure Laterality Date  . ABDOMINAL HYSTERECTOMY    . colonscopy  2015  . CYSTOSCOPY W/ RETROGRADES Bilateral 10/31/2018   Procedure: CYSTOSCOPY WITH RETROGRADE PYELOGRAM;  Surgeon: Hollice Espy, MD;  Location: [facility] ORS;  Service: Urology;  Laterality: Bilateral;  . CYSTOSCOPY WITH BIOPSY N/A 10/31/2018   Procedure: CYSTOSCOPY WITH Bladder BIOPSY;  Surgeon: Hollice Espy, MD;  Location: [facility] ORS;  Service: Urology;  Laterality: N/A;  . ESOPHAGOGASTRODUODENOSCOPY (EGD) WITH PROPOFOL N/A 09/30/2017   Procedure: ESOPHAGOGASTRODUODENOSCOPY (EGD) WITH PROPOFOL;  Surgeon: Virgel Manifold, MD;  Location: [facility] ENDOSCOPY;  Service: Endoscopy;  Laterality: N/A;  . NEPHRECTOMY Left   . THYROIDECTOMY    . TONSILLECTOMY Right     Home  Medications:  Allergies as of 03/23/2019      Reactions   Nitrofurantoin Rash      Medication List       Accurate as of March 23, 2019 11:59 PM. If you have any questions, ask your nurse or doctor.        Biotin 1000 MCG tablet Take 1,000 mcg by mouth daily.   Calcium 500 MG Chew Chew 500 mg by mouth daily.   cloNIDine 0.1 MG tablet Commonly known as: Catapres Take 1 tablet (0.1 mg total) by mouth 2 (two) times daily as needed.   estradiol 0.5 MG tablet Commonly known as: ESTRACE Take 1 tablet (0.5 mg total) by mouth daily.   losartan 25 MG tablet Commonly known as: COZAAR Take 1 tablet (25 mg total) by mouth 2 (two) times daily.   metoprolol succinate 25 MG 24 hr tablet Commonly known as: TOPROL-XL Take 12.5 mg by mouth daily.   oxybutynin 5 MG tablet Commonly known as: DITROPAN Take 1 tablet (5 mg total) by mouth every 8 (eight) hours as needed for bladder spasms.   Synthroid 137 MCG tablet Generic drug: levothyroxine Take 137 mcg by mouth daily before breakfast.   tretinoin 0.025 % cream Commonly known as: RETIN-A Apply 1 application topically daily as needed.   vitamin C 500 MG tablet Commonly known as: ASCORBIC ACID Take 500 mg by mouth daily.   Vitamin D3 25 MCG (1000 UT)  Caps Take 1,000 Units by mouth every other day.       Allergies:  Allergies  Allergen Reactions  . Nitrofurantoin Rash    Family History: Family History  Problem Relation Age of Onset  . Cancer Father     Social History:  reports that she has been smoking cigarettes. She has been smoking about 0.25 packs per day. She has never used smokeless tobacco. She reports current alcohol use of about 7.0 standard drinks of alcohol per week. She reports that she does not use drugs.  ROS: UROLOGY Frequent Urination?: Yes Hard to postpone urination?: No Burning/pain with urination?: Yes Get up at night to urinate?: Yes Leakage of urine?: No Urine stream starts and stops?: No  Trouble starting stream?: Yes Do you have to strain to urinate?: No Blood in urine?: No Urinary tract infection?: No Sexually transmitted disease?: No Injury to kidneys or bladder?: No Painful intercourse?: No Weak stream?: No Currently pregnant?: No Vaginal bleeding?: No Last menstrual period?: n  Gastrointestinal Nausea?: No Vomiting?: No Indigestion/heartburn?: No Diarrhea?: No Constipation?: No  Constitutional Night sweats?: No Weight loss?: No Fatigue?: No  Skin Skin rash/lesions?: No Itching?: No  Eyes Blurred vision?: No Double vision?: No  Ears/Nose/Throat Sore throat?: No Sinus problems?: No  Hematologic/Lymphatic Swollen glands?: No Easy bruising?: No  Cardiovascular Leg swelling?: No Chest pain?: No  Respiratory Cough?: No Shortness of breath?: No  Endocrine Excessive thirst?: No  Musculoskeletal Back pain?: No Joint pain?: No  Neurological Headaches?: No Dizziness?: No  Psychologic Depression?: No Anxiety?: No  Physical Exam: BP 123/70   Pulse 65   Ht 5\' 3"  (1.6 m)   Wt 123 lb (55.8 kg)   BMI 21.79 kg/m   Constitutional:  Well nourished. Alert and oriented, No acute distress. HEENT: [place] AT, moist mucus membranes.  Trachea midline, no masses. Cardiovascular: No clubbing, cyanosis, or edema. Respiratory: Normal respiratory effort, no increased work of breathing. GI: Abdomen is soft, non tender, non distended, no abdominal masses. Liver and spleen not palpable.  No hernias appreciated.  Stool sample for occult testing is not indicated.   GU: No CVA tenderness.  No bladder fullness or masses.  Atrophic external genitalia, sparse pubic hair distribution, no lesions.  Ulcerative type lesion at the urethral meatus.  No bladder fullness, tenderness or masses. Pale vagina mucosa, poor estrogen effect, no discharge, no lesions, fair pelvic support, no cystocele and no rectocele noted.  Cervix, uterus and adnexa surgically absent.  Anus and  perineum are without rashes or lesions.    Skin: No rashes, bruises or suspicious lesions. Lymph: No inguinal adenopathy. Neurologic: Grossly intact, no focal deficits, moving all 4 extremities. Psychiatric: Normal mood and affect.   Laboratory Data: Lab Results  Component Value Date   WBC 5.9 10/06/2018   HGB 12.7 10/06/2018   HCT 38.6 10/06/2018   MCV 87 10/06/2018   PLT 217 10/06/2018    Lab Results  Component Value Date   CREATININE 0.68 10/20/2018    No results found for: PSA  No results found for: TESTOSTERONE  No results found for: HGBA1C  Lab Results  Component Value Date   TSH 3.740 10/06/2018       Component Value Date/Time   CHOL 172 10/06/2018 0839   HDL 64 10/06/2018 0839   LDLCALC 89 10/06/2018 0839    Lab Results  Component Value Date   AST 22 04/28/2018   Lab Results  Component Value Date   ALT 14 04/28/2018  No components found for: ALKALINEPHOPHATASE No components found for: BILIRUBINTOTAL  No results found for: ESTRADIOL  Urinalysis Component     Latest Ref Rng & Units 03/23/2019          Specific Gravity, UA     1.005 - 1.030 <1.005 (L)  pH, UA     5.0 - 7.5 6.0  Color, UA     Yellow Yellow  Appearance Ur     Clear Clear  Leukocytes,UA     Negative Negative  Protein,UA     Negative/Trace Negative  Glucose, UA     Negative Negative  Ketones, UA     Negative Negative  RBC, UA     Negative Negative  Bilirubin, UA     Negative Negative  Urobilinogen, Ur     0.2 - 1.0 mg/dL 0.2  Nitrite, UA     Negative Negative  Microscopic Examination      See below:   Component     Latest Ref Rng & Units 03/23/2019  WBC, UA     0 - 5 /hpf 0-5  RBC     0 - 2 /hpf None seen  Epithelial Cells (non renal)     0 - 10 /hpf 0-10  Bacteria, UA     None seen/Few None seen    I have reviewed the labs.   Assessment & Plan:    1. Intermittent Dysuria ? Herpes  - Urinalysis, Complete - bland - HSV 1 & 2, RPR, HIV, chlamydia  and gonorrhea testing submitted  2. PUNLMP of bladder Patient has a cystoscopy scheduled for 11/2019    Return in about 2 weeks (around 04/06/2019) for exam and symptom recheck.  These notes generated with voice recognition software. I apologize for typographical errors.  Zara Council, PA-C  [facility] [address]  [place] [place], [place] [address] [phone]

## 2019-03-24 LAB — HSV(HERPES SIMPLEX VRS) I + II AB-IGG
HSV 1 Glycoprotein G Ab, IgG: 48.8 index — ABNORMAL HIGH (ref 0.00–0.90)
HSV 2 IgG, Type Spec: 2.73 index — ABNORMAL HIGH (ref 0.00–0.90)

## 2019-03-24 LAB — HIV ANTIBODY (ROUTINE TESTING W REFLEX): HIV Screen 4th Generation wRfx: NONREACTIVE

## 2019-03-24 LAB — RPR: RPR Ser Ql: NONREACTIVE

## 2019-03-24 LAB — HSV-2 IGG SUPPLEMENTAL TEST: HSV-2 IgG Supplemental Test: POSITIVE — AB

## 2019-03-27 ENCOUNTER — Other Ambulatory Visit: Payer: Self-pay | Admitting: Urology

## 2019-03-27 ENCOUNTER — Telehealth: Payer: Self-pay | Admitting: Urology

## 2019-03-27 MED ORDER — VALACYCLOVIR HCL 1 G PO TABS: 1000.0000 mg | ORAL_TABLET | Freq: Two times a day (BID) | ORAL | 0 refills | Status: DC

## 2019-03-27 NOTE — Telephone Encounter (Signed)
Pt called office asking to speak to [facility] not an Environmental consultant, pt wants to discuss her results she has recently received with you sooner than her upcoming appt. Please advise pt at [phone]. Thank you

## 2019-03-27 NOTE — Telephone Encounter (Signed)
I was speaking with Mrs. Belson and the phone cut out.  I tried to call back, but it would not let me through.

## 2019-03-29 LAB — CHLAMYDIA/GONOCOCCUS/TRICHOMONAS, NAA
Chlamydia by NAA: NEGATIVE
Gonococcus by NAA: NEGATIVE
Trich vag by NAA: NEGATIVE

## 2019-03-30 ENCOUNTER — Encounter: Payer: Self-pay | Admitting: *Deleted

## 2019-03-31 ENCOUNTER — Telehealth: Payer: Self-pay | Admitting: Gastroenterology

## 2019-03-31 NOTE — Telephone Encounter (Signed)
Pt is calling to schedule apt  No apt available for  Dr. Bonna Gains pt needs to discuss intestinal problems please call pt  cb [phone]

## 2019-04-03 NOTE — Telephone Encounter (Signed)
Called and made appointment with Dr. Bonna Gains at [place] tomorrow in [place]

## 2019-04-03 NOTE — Telephone Encounter (Signed)
Patient states 3 months ago she was seen for constipation. Patient states about 1 month ago she began to have very loses stools every day. She states this morning now she can not go at all.

## 2019-04-04 ENCOUNTER — Encounter: Payer: Self-pay | Admitting: Gastroenterology

## 2019-04-04 ENCOUNTER — Ambulatory Visit (INDEPENDENT_AMBULATORY_CARE_PROVIDER_SITE_OTHER): Payer: Medicare HMO | Admitting: Gastroenterology

## 2019-04-04 ENCOUNTER — Other Ambulatory Visit: Payer: Self-pay

## 2019-04-04 VITALS — BP 117/77 | HR 66 | Temp 97.0°F | Wt 133.2 lb

## 2019-04-04 DIAGNOSIS — R197 Diarrhea, unspecified: Secondary | ICD-10-CM

## 2019-04-05 NOTE — Progress Notes (Signed)
[name], MD [address]  [place]  [place], [place] [address]  Main: [phone]  Fax: [phone]   Primary Care Physician: Lavera Guise, MD   Chief Complaint  Patient presents with  . Constipation    Patient states she has had constipation for several months. She states the last 3 weeks she has had diarrhea. 3-4 times a day. Patient has some abdominal cramping     HPI: Amanda Livingston is a 73 y.o. female with history of grade a esophagitis, completed PPI course, previous constipation, now complaining of diarrhea.  This started 3 weeks ago.  Reports about 4 loose bowel movements a day.  No blood in stool.  No weight loss.  No nausea or vomiting.  No recent antibiotic use.  No sick contacts.  No recent travel  Past Medical History:  Diagnosis Date  . Arthritis   . Complication of anesthesia   . Dysrhythmia   . Family history of adverse reaction to anesthesia    PONV  . Hypertension   . Hypothyroidism   . Osteopenia   . PONV (postoperative nausea and vomiting)   . 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[place])    thyroid     Current Outpatient Medications  Medication Sig Dispense Refill  . Biotin 1000 MCG tablet Take 1,000 mcg by mouth daily.    . Calcium 500 MG CHEW Chew 500 mg by mouth daily.     . Cholecalciferol (VITAMIN D3) 1000 units CAPS Take 1,000 Units by mouth every other day.     . cloNIDine (CATAPRES) 0.1 MG tablet Take 1 tablet (0.1 mg total) by mouth 2 (two) times daily as needed. 30 tablet 0  . estradiol (ESTRACE) 0.5 MG tablet Take 1 tablet (0.5 mg total) by mouth daily. 90 tablet 3  . levothyroxine (SYNTHROID) 137 MCG tablet Take 137 mcg by mouth daily before breakfast.     . losartan (COZAAR) 25 MG tablet Take 1 tablet (25 mg total) by mouth 2 (two) times daily. 180 tablet 4  . metoprolol succinate (TOPROL-XL) 25 MG 24 hr tablet Take 12.5 mg by mouth daily.     Marland Kitchen oxybutynin (DITROPAN) 5 MG tablet Take 1 tablet (5 mg total) by mouth every 8 (eight) hours as  needed for bladder spasms. 30 tablet 0  . tretinoin (RETIN-A) 0.025 % cream Apply 1 application topically daily as needed. 20 g 3  . valACYclovir (VALTREX) 1000 MG tablet Take 1 tablet (1,000 mg total) by mouth 2 (two) times daily. 20 tablet 0  . vitamin C (ASCORBIC ACID) 500 MG tablet Take 500 mg by mouth daily.     No current facility-administered medications for this visit.     Allergies as of 04/04/2019 - Review Complete 04/04/2019  Allergen Reaction Noted  . Nitrofurantoin Rash 10/05/2014    ROS:  General: Negative for anorexia, weight loss, fever, chills, fatigue, weakness. ENT: Negative for hoarseness, difficulty swallowing , nasal congestion. CV: Negative for chest pain, angina, palpitations, dyspnea on exertion, peripheral edema.  Respiratory: Negative for dyspnea at rest, dyspnea on exertion, cough, sputum, wheezing.  GI: See history of present illness. GU:  Negative for dysuria, hematuria, urinary incontinence, urinary frequency, nocturnal urination.  Endo: Negative for unusual weight change.    Physical Examination:   BP 117/77 (BP Location: Left Arm, Patient Position: Sitting, Cuff Size: Normal)   Pulse 66   Temp (!) 97 F (36.1 C) (Tympanic)   Wt 133 lb 4 oz (60.4 kg)  BMI 23.60 kg/m   General: Well-nourished, well-developed in no acute distress.  Eyes: No icterus. Conjunctivae pink. Mouth: Oropharyngeal mucosa moist and pink , no lesions erythema or exudate. Neck: Supple, Trachea midline Abdomen: Bowel sounds are normal, nontender, nondistended, no hepatosplenomegaly or masses, no abdominal bruits or hernia , no rebound or guarding.   Extremities: No lower extremity edema. No clubbing or deformities. Neuro: Alert and oriented x 3.  Grossly intact. Skin: Warm and dry, no jaundice.   Psych: Alert and cooperative, normal mood and affect.   Labs: CMP     Component Value Date/Time   NA 140 10/20/2018 1045   K 4.3 10/20/2018 1045   CL 101 10/20/2018 1045    CO2 24 10/20/2018 1045   GLUCOSE 101 (H) 10/20/2018 1045   GLUCOSE 109 (H) 09/03/2015 0758   BUN 22 10/20/2018 1045   CREATININE 0.68 10/20/2018 1045   CALCIUM 9.7 10/20/2018 1045   PROT 6.7 04/28/2018 0845   ALBUMIN 4.5 04/28/2018 0845   AST 22 04/28/2018 0845   ALT 14 04/28/2018 0845   ALKPHOS 59 04/28/2018 0845   BILITOT 0.4 04/28/2018 0845   GFRNONAA 86 10/20/2018 1045   GFRAA 99 10/20/2018 1045   Lab Results  Component Value Date   WBC 5.9 10/06/2018   HGB 12.7 10/06/2018   HCT 38.6 10/06/2018   MCV 87 10/06/2018   PLT 217 10/06/2018    Imaging Studies: No results found.  Assessment and Plan:   Amanda Livingston is a 73 y.o. y/o female previously constipated, now reporting diarrhea, and has held her MiraLAX since the symptoms started  Due to acute diarrhea, rule out infectious process with GI panel  Patient asked to avoid caffeine or sugar foods and drinks  Hydrate well  Practice good oral and hand hygiene    Dr [name]

## 2019-04-08 LAB — GI PROFILE, STOOL, PCR

## 2019-04-11 NOTE — Progress Notes (Signed)
04/12/2019 1:09 PM   Amanda Livingston [birth year] [id number]  Referring provider: Lavera Guise, [place],  [place] [address]  Chief Complaint  Patient presents with  . Recurrent UTI    HPI: Mrs. Curl is a 73 year old female with PUNLMP of the bladder who presents today for symptoms of burning and frequency.  She states that she has had on and off burning in the perineal area for some time, but it was worse last week.  She has had "messed" up bowels with constipation and now diarrhea.  Cystex has been helping somewhat.  Patient denies any gross hematuria or suprapubic/flank pain.  Patient denies any fevers, chills, nausea or vomiting.   She is in a sexual relationship with a man who is also sexually active with another woman.  He is not wearing condoms.    UA was bland.    She was found to have herpes lesions in the vaginal area at her last appointment.  She was prescribed Valtrex.    Today, she states the burning in the perineal area has resolved.  She has not seen any further lesions, but the lesions that I visualized were internal so it is likely she may not see an external outbreak.  Patient denies any gross hematuria, dysuria or suprapubic/flank pain.  Patient denies any fevers, chills, nausea or vomiting.     PMH: Past Medical History:  Diagnosis Date  . Arthritis   . Complication of anesthesia   . Dysrhythmia   . Family history of adverse reaction to anesthesia    PONV  . Hypertension   . Hypothyroidism   . Osteopenia   . PONV (postoperative nausea and vomiting)   . Thyroid cancer [facility])    thyroid    Surgical History: Past Surgical History:  Procedure Laterality Date  . ABDOMINAL HYSTERECTOMY    . colonscopy  2015  . CYSTOSCOPY W/ RETROGRADES Bilateral 10/31/2018   Procedure: CYSTOSCOPY WITH RETROGRADE PYELOGRAM;  Surgeon: Hollice Espy, MD;  Location: [facility];  Service: Urology;  Laterality: Bilateral;  . CYSTOSCOPY WITH BIOPSY N/A  10/31/2018   Procedure: CYSTOSCOPY WITH Bladder BIOPSY;  Surgeon: Hollice Espy, MD;  Location: [facility];  Service: Urology;  Laterality: N/A;  . ESOPHAGOGASTRODUODENOSCOPY (EGD) WITH PROPOFOL N/A 09/30/2017   Procedure: ESOPHAGOGASTRODUODENOSCOPY (EGD) WITH PROPOFOL;  Surgeon: Virgel Manifold, MD;  Location: [facility];  Service: Endoscopy;  Laterality: N/A;  . NEPHRECTOMY Left   . THYROIDECTOMY    . TONSILLECTOMY Right     Home Medications:  Allergies as of 04/12/2019      Reactions   Nitrofurantoin Rash      Medication List       Accurate as of April 12, 2019  1:09 PM. If you have any questions, ask your nurse or doctor.        Biotin 1000 MCG tablet Take 1,000 mcg by mouth daily.   Calcium 500 MG Chew Chew 500 mg by mouth daily.   cloNIDine 0.1 MG tablet Commonly known as: Catapres Take 1 tablet (0.1 mg total) by mouth 2 (two) times daily as needed.   estradiol 0.5 MG tablet Commonly known as: ESTRACE Take 1 tablet (0.5 mg total) by mouth daily.   losartan 25 MG tablet Commonly known as: COZAAR Take 1 tablet (25 mg total) by mouth 2 (two) times daily.   metoprolol succinate 25 MG 24 hr tablet Commonly known as: TOPROL-XL Take 12.5 mg by mouth daily.   oxybutynin  5 MG tablet Commonly known as: DITROPAN Take 1 tablet (5 mg total) by mouth every 8 (eight) hours as needed for bladder spasms.   Premarin vaginal cream Generic drug: conjugated estrogens Apply 0.5mg  (pea-sized amount)  just inside the vaginal introitus with a finger-tip on  Monday, Wednesday and Friday nights. Started by: Zara Council, PA-C   Synthroid 137 MCG tablet Generic drug: levothyroxine Take 137 mcg by mouth daily before breakfast.   tretinoin 0.025 % cream Commonly known as: RETIN-A Apply 1 application topically daily as needed.   valACYclovir 500 MG tablet Commonly known as: Valtrex Take 1 tablet (500 mg total) by mouth daily. What changed:   medication strength   how much to take  when to take this Changed by: Rhonin Trott, PA-C   vitamin C 500 MG tablet Commonly known as: ASCORBIC ACID Take 500 mg by mouth daily.   Vitamin D3 25 MCG (1000 UT) Caps Take 1,000 Units by mouth every other day.       Allergies:  Allergies  Allergen Reactions  . Nitrofurantoin Rash    Family History: Family History  Problem Relation Age of Onset  . Cancer Father     Social History:  reports that she has been smoking cigarettes. She has been smoking about 0.25 packs per day. She has never used smokeless tobacco. She reports current alcohol use of about 7.0 standard drinks of alcohol per week. She reports that she does not use drugs.  ROS: UROLOGY Frequent Urination?: Yes Hard to postpone urination?: No Burning/pain with urination?: No Get up at night to urinate?: Yes Leakage of urine?: No Urine stream starts and stops?: No Trouble starting stream?: Yes Do you have to strain to urinate?: No Blood in urine?: No Urinary tract infection?: No Sexually transmitted disease?: No Injury to kidneys or bladder?: No Painful intercourse?: No Weak stream?: No Currently pregnant?: No Vaginal bleeding?: No Last menstrual period?: n  Gastrointestinal Nausea?: No Vomiting?: No Indigestion/heartburn?: No Diarrhea?: No Constipation?: No  Constitutional Fever: No Night sweats?: No Weight loss?: No Fatigue?: No  Skin Skin rash/lesions?: No Itching?: No  Eyes Blurred vision?: No Double vision?: No  Ears/Nose/Throat Sore throat?: No Sinus problems?: No  Hematologic/Lymphatic Swollen glands?: No Easy bruising?: No  Cardiovascular Leg swelling?: No Chest pain?: No  Respiratory Cough?: No Shortness of breath?: No  Endocrine Excessive thirst?: No  Musculoskeletal Back pain?: No Joint pain?: No  Neurological Headaches?: No Dizziness?: No  Psychologic Depression?: No Anxiety?: No  Physical Exam: BP 119/74   Pulse 60   Ht  5\' 3"  (1.6 m)   Wt 127 lb (57.6 kg)   BMI 22.50 kg/m   Constitutional:  Well nourished. Alert and oriented, No acute distress. HEENT: [place] AT, moist mucus membranes.  Trachea midline, no masses. Cardiovascular: No clubbing, cyanosis, or edema. Respiratory: Normal respiratory effort, no increased work of breathing. GI: Abdomen is soft, non tender, non distended, no abdominal masses. Liver and spleen not palpable.  No hernias appreciated.  Stool sample for occult testing is not indicated.   GU: No CVA tenderness.  No bladder fullness or masses.  Atrophic external genitalia, sparse pubic hair distribution, no lesions.  Normal urethral meatus, no lesions, no prolapse, no discharge.   No urethral masses, tenderness and/or tenderness.   Herpes lesion has resolved.  No bladder fullness, tenderness or masses. Pale vagina mucosa, poor estrogen effect, no discharge, no lesions, fair pelvic support, no cystocele and no rectocele noted.   Neurologic: Grossly intact, no  focal deficits, moving all 4 extremities. Psychiatric: Normal mood and affect.   Laboratory Data: Lab Results  Component Value Date   WBC 5.9 10/06/2018   HGB 12.7 10/06/2018   HCT 38.6 10/06/2018   MCV 87 10/06/2018   PLT 217 10/06/2018    Lab Results  Component Value Date   CREATININE 0.68 10/20/2018    No results found for: PSA  No results found for: TESTOSTERONE  No results found for: HGBA1C  Lab Results  Component Value Date   TSH 3.740 10/06/2018       Component Value Date/Time   CHOL 172 10/06/2018 0839   HDL 64 10/06/2018 0839   LDLCALC 89 10/06/2018 0839    Lab Results  Component Value Date   AST 22 04/28/2018   Lab Results  Component Value Date   ALT 14 04/28/2018   No components found for: ALKALINEPHOPHATASE No components found for: BILIRUBINTOTAL  No results found for: ESTRADIOL  I have reviewed the labs.   Assessment & Plan:    1. Genital herpes Patient would like to go on suppressive  therapy so I have sent prescription for Valtrex 500 mg daily to [facility] [place]  2. Vaginal atrophy Vaginal cream may be cost prohibitive to patient I have printed a prescription for the Premarin cream so that she can take it to different pharmacies to see if the best price I have given her a sample of Premarin cream and instructed her to apply blueberry size amount with her fingertip to just inside the vaginal introitus on Monday, Wednesday and Friday.  3. PUNLMP of bladder Patient has a cystoscopy scheduled for 11/2019    Return for keep cysto appointment with Dr. Erlene Quan .  These notes generated with voice recognition software. I apologize for typographical errors.  Zara Council, PA-C  [facility] [address]  [place],  [address] [phone]

## 2019-04-12 ENCOUNTER — Ambulatory Visit: Payer: Medicare HMO | Admitting: Urology

## 2019-04-12 ENCOUNTER — Other Ambulatory Visit: Payer: Self-pay

## 2019-04-12 ENCOUNTER — Telehealth: Payer: Self-pay

## 2019-04-12 ENCOUNTER — Encounter: Payer: Self-pay | Admitting: Urology

## 2019-04-12 VITALS — BP 119/74 | HR 60 | Ht 63.0 in | Wt 127.0 lb

## 2019-04-12 DIAGNOSIS — N952 Postmenopausal atrophic vaginitis: Secondary | ICD-10-CM | POA: Diagnosis not present

## 2019-04-12 DIAGNOSIS — D414 Neoplasm of uncertain behavior of bladder: Secondary | ICD-10-CM

## 2019-04-12 DIAGNOSIS — R69 Illness, unspecified: Secondary | ICD-10-CM | POA: Diagnosis not present

## 2019-04-12 DIAGNOSIS — A6009 Herpesviral infection of other urogenital tract: Secondary | ICD-10-CM | POA: Diagnosis not present

## 2019-04-12 MED ORDER — PREMARIN 0.625 MG/GM VA CREA: TOPICAL_CREAM | VAGINAL | 12 refills | Status: DC

## 2019-04-12 MED ORDER — VALACYCLOVIR HCL 500 MG PO TABS: 500.0000 mg | ORAL_TABLET | Freq: Every day | ORAL | 3 refills | Status: DC

## 2019-04-12 NOTE — Telephone Encounter (Signed)
Patient verbalized understanding  

## 2019-04-12 NOTE — Telephone Encounter (Signed)
Called and left a message for call back  

## 2019-04-12 NOTE — Telephone Encounter (Signed)
-----   Message from Virgel Manifold, MD sent at 04/12/2019  3:10 PM EST ----- Caryl Pina please let the patient know, stool testing showed infection with yersinia. This mostly resolves on its own. If she has fever or worsening symptoms to please let us know and we can consider antibiotics. But if its improving its best to hydrate well, practice good oral and hand hygiene, eat well and let it improve.

## 2019-04-14 ENCOUNTER — Telehealth: Payer: Self-pay

## 2019-04-14 NOTE — Telephone Encounter (Signed)
lmom for patient to confirm and screen for 04-18-19 ov.

## 2019-04-17 ENCOUNTER — Telehealth: Payer: Self-pay

## 2019-04-17 NOTE — Telephone Encounter (Signed)
Confirmed patient appt for 04/18/19. Amanda Livingston

## 2019-04-18 ENCOUNTER — Encounter: Payer: Self-pay | Admitting: Internal Medicine

## 2019-04-18 ENCOUNTER — Ambulatory Visit (INDEPENDENT_AMBULATORY_CARE_PROVIDER_SITE_OTHER): Payer: Medicare HMO | Admitting: Internal Medicine

## 2019-04-18 ENCOUNTER — Telehealth: Payer: Self-pay

## 2019-04-18 ENCOUNTER — Other Ambulatory Visit: Payer: Self-pay

## 2019-04-18 VITALS — BP 118/80 | HR 80 | Resp 16 | Ht 63.0 in | Wt 131.4 lb

## 2019-04-18 DIAGNOSIS — R69 Illness, unspecified: Secondary | ICD-10-CM | POA: Diagnosis not present

## 2019-04-18 DIAGNOSIS — Z0001 Encounter for general adult medical examination with abnormal findings: Secondary | ICD-10-CM | POA: Diagnosis not present

## 2019-04-18 DIAGNOSIS — I10 Essential (primary) hypertension: Secondary | ICD-10-CM | POA: Diagnosis not present

## 2019-04-18 DIAGNOSIS — A6000 Herpesviral infection of urogenital system, unspecified: Secondary | ICD-10-CM

## 2019-04-18 DIAGNOSIS — Z1231 Encounter for screening mammogram for malignant neoplasm of breast: Secondary | ICD-10-CM | POA: Diagnosis not present

## 2019-04-18 DIAGNOSIS — E039 Hypothyroidism, unspecified: Secondary | ICD-10-CM

## 2019-04-18 DIAGNOSIS — R300 Dysuria: Secondary | ICD-10-CM | POA: Diagnosis not present

## 2019-04-18 NOTE — Progress Notes (Signed)
[facility] [place], [place] [address]  Internal MEDICINE  Office Visit Note  Patient Name: Amanda Livingston  [id number]  [id number]  Date of Service: 04/24/2019  Chief Complaint  Patient presents with  . Medicare Wellness  . Hypertension  . Arthritis  . Hypothyroidism   HPI Pt is here for routine health maintenance examination. She feels well but a little under stress due to personal reasons. BP seems to be under good control. She was diagnosed with genital herpes and is now on antiviral therapy. She does follow endocrinology for thyroid cancer s/p thyroidectomy and now on replacement with synthroid. Stress incontinence is better as well   Current Medication: Outpatient Encounter Medications as of 04/18/2019  Medication Sig  . Biotin 1000 MCG tablet Take 1,000 mcg by mouth daily.  . Calcium 500 MG CHEW Chew 500 mg by mouth daily.   . Cholecalciferol (VITAMIN D3) 1000 units CAPS Take 1,000 Units by mouth every other day.   . cloNIDine (CATAPRES) 0.1 MG tablet Take 1 tablet (0.1 mg total) by mouth 2 (two) times daily as needed.  . conjugated estrogens (PREMARIN) vaginal cream Apply 0.5mg  (pea-sized amount)  just inside the vaginal introitus with a finger-tip on  Monday, Wednesday and Friday nights.  . estradiol (ESTRACE) 0.5 MG tablet Take 1 tablet (0.5 mg total) by mouth daily.  Marland Kitchen levothyroxine (SYNTHROID) 137 MCG tablet Take 137 mcg by mouth daily before breakfast.   . losartan (COZAAR) 25 MG tablet Take 1 tablet (25 mg total) by mouth 2 (two) times daily.  . metoprolol succinate (TOPROL-XL) 25 MG 24 hr tablet Take 12.5 mg by mouth daily.   Marland Kitchen oxybutynin (DITROPAN) 5 MG tablet Take 1 tablet (5 mg total) by mouth every 8 (eight) hours as needed for bladder spasms.  Marland Kitchen tretinoin (RETIN-A) 0.025 % cream Apply 1 application topically daily as needed.  . valACYclovir (VALTREX) 500 MG tablet Take 1 tablet (500 mg total) by mouth daily.  . vitamin C (ASCORBIC  ACID) 500 MG tablet Take 500 mg by mouth daily.   No facility-administered encounter medications on file as of 04/18/2019.    Surgical History: Past Surgical History:  Procedure Laterality Date  . ABDOMINAL HYSTERECTOMY    . colonscopy  2015  . CYSTOSCOPY W/ RETROGRADES Bilateral 10/31/2018   Procedure: CYSTOSCOPY WITH RETROGRADE PYELOGRAM;  Surgeon: Hollice Espy, MD;  Location: [facility];  Service: Urology;  Laterality: Bilateral;  . CYSTOSCOPY WITH BIOPSY N/A 10/31/2018   Procedure: CYSTOSCOPY WITH Bladder BIOPSY;  Surgeon: Hollice Espy, MD;  Location: [facility];  Service: Urology;  Laterality: N/A;  . ESOPHAGOGASTRODUODENOSCOPY (EGD) WITH PROPOFOL N/A 09/30/2017   Procedure: ESOPHAGOGASTRODUODENOSCOPY (EGD) WITH PROPOFOL;  Surgeon: Virgel Manifold, MD;  Location: [facility];  Service: Endoscopy;  Laterality: N/A;  . NEPHRECTOMY Left   . THYROIDECTOMY    . TONSILLECTOMY Right     Medical History: Past Medical History:  Diagnosis Date  . Arthritis   . Complication of anesthesia   . Dysrhythmia   . Family history of adverse reaction to anesthesia    PONV  . Hypertension   . Hypothyroidism   . Osteopenia   . PONV (postoperative nausea and vomiting)   . 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[place])    thyroid    Family History: Family History  Problem Relation Age of Onset  . Cancer Father    Review of Systems  Constitutional: Negative for chills, diaphoresis and fatigue.  HENT: Negative for ear pain, postnasal drip  and sinus pressure.   Eyes: Negative for photophobia, discharge, redness, itching and visual disturbance.  Respiratory: Negative for cough, shortness of breath and wheezing.   Cardiovascular: Negative for chest pain, palpitations and leg swelling.  Gastrointestinal: Negative for abdominal pain, constipation, diarrhea, nausea and vomiting.  Genitourinary: Negative for dysuria and flank pain.  Musculoskeletal: Negative for arthralgias, back pain, gait problem and neck pain.   Skin: Negative for color change.  Allergic/Immunologic: Negative for environmental allergies and food allergies.  Neurological: Negative for dizziness and headaches.  Hematological: Does not bruise/bleed easily.  Psychiatric/Behavioral: Negative for agitation, behavioral problems (depression) and hallucinations.   Vital Signs: BP 118/80   Pulse 80   Resp 16   Ht 5\' 3"  (1.6 m)   Wt 131 lb 6.4 oz (59.6 kg)   BMI 23.28 kg/m    Physical Exam Constitutional:      General: She is not in acute distress.    Appearance: She is well-developed. She is not diaphoretic.  HENT:     Head: Normocephalic and atraumatic.     Mouth/Throat:     Pharynx: No oropharyngeal exudate.  Eyes:     Pupils: Pupils are equal, round, and reactive to light.  Neck:     Musculoskeletal: Normal range of motion and neck supple.     Thyroid: No thyromegaly.     Vascular: No JVD.     Trachea: No tracheal deviation.  Cardiovascular:     Rate and Rhythm: Normal rate and regular rhythm.     Heart sounds: Normal heart sounds. No murmur. No friction rub. No gallop.   Pulmonary:     Effort: Pulmonary effort is normal. No respiratory distress.     Breath sounds: No wheezing or rales.  Chest:     Chest wall: No tenderness.  Abdominal:     General: Bowel sounds are normal.     Palpations: Abdomen is soft.  Musculoskeletal: Normal range of motion.  Lymphadenopathy:     Cervical: No cervical adenopathy.  Skin:    General: Skin is warm and dry.  Neurological:     Mental Status: She is alert and oriented to person, place, and time.     Cranial Nerves: No cranial nerve deficit.  Psychiatric:        Behavior: Behavior normal.        Thought Content: Thought content normal.        Judgment: Judgment normal.    LABS: Recent Results (from the past 2160 hour(s))  Urinalysis, Complete     Status: Abnormal   Collection Time: 03/23/19  1:04 PM  Result Value Ref Range   Specific Gravity, UA <1.005 (L) 1.005 - 1.030    pH, UA 6.0 5.0 - 7.5   Color, UA Yellow Yellow   Appearance Ur Clear Clear   Leukocytes,UA Negative Negative   Protein,UA Negative Negative/Trace   Glucose, UA Negative Negative   Ketones, UA Negative Negative   RBC, UA Negative Negative   Bilirubin, UA Negative Negative   Urobilinogen, Ur 0.2 0.2 - 1.0 mg/dL   Nitrite, UA Negative Negative   Microscopic Examination See below:   Microscopic Examination     Status: None   Collection Time: 03/23/19  1:04 PM   URINE  Result Value Ref Range   WBC, UA 0-5 0 - 5 /hpf   RBC None seen 0 - 2 /hpf   Epithelial Cells (non renal) 0-10 0 - 10 /hpf   Bacteria, UA None seen None seen/Few  Chlamydia/Gonococcus/Trichomonas, NAA     Status: None   Collection Time: 03/23/19  2:04 PM   Specimen: Urine   UR  Result Value Ref Range   Chlamydia by NAA Negative Negative   Gonococcus by NAA Negative Negative   Trich vag by NAA Negative Negative  HSV(herpes simplex vrs) 1+2 ab-IgG     Status: Abnormal   Collection Time: 03/23/19  2:14 PM  Result Value Ref Range   HSV 1 Glycoprotein G Ab, IgG 48.80 (H) 0.00 - 0.90 index    Comment:                                  Negative        <0.91                                  Equivocal 0.91 - 1.09                                  Positive        >1.09  Note: Negative indicates no antibodies detected to  HSV-1. Equivocal may suggest early infection.  If  clinically appropriate, retest at later date. Positive  indicates antibodies detected to HSV-1.    HSV 2 IgG, Type Spec 2.73 (H) 0.00 - 0.90 index    Comment:                                  Negative        <0.91                                  Equivocal 0.91 - 1.09                                  Positive        >1.09  Note: Negative indicates no antibodies detected to  HSV-2. Equivocal may suggest early infection.  If  clinically appropriate, retest at later date. Positive  indicates antibodies detected to HSV-2.   RPR     Status: None    Collection Time: 03/23/19  2:14 PM  Result Value Ref Range   RPR Ser Ql Non Reactive Non Reactive  HIV antibody (with reflex)     Status: None   Collection Time: 03/23/19  2:14 PM  Result Value Ref Range   HIV Screen 4th Generation wRfx Non Reactive Non Reactive  HSV-2 IgG Supplemental Test     Status: Abnormal   Collection Time: 03/23/19  2:14 PM  Result Value Ref Range   HSV-2 IgG Supplemental Test Positive (A) Negative    Comment:  HSV-2 IgG          HSV-2 IgG Type Specific      Confirmation      Interpretation ------------------------------------------------------- Positive/Equivocal   Positive    Indicates the presence                                  of detectable IgG  antibodies to HSV-2. ------------------------------------------------------- Positive/Equivocal   Negative    Unable to confirm the                                  presence of IgG                                  antibodies to HSV-2.                                  Recommend retesting                                  in 2-4 weeks. -------------------------------------------------------   GI Profile, Stool, PCR     Status: Abnormal   Collection Time: 04/04/19  2:00 PM  Result Value Ref Range   Campylobacter Not Detected Not Detected   C difficile toxin A/B Not Detected Not Detected   Plesiomonas shigelloides Not Detected Not Detected   Salmonella Not Detected Not Detected   Vibrio Not Detected Not Detected   Vibrio cholerae Not Detected Not Detected   Yersinia enterocolitica Detected (A) Not Detected   Enteroaggregative E coli Not Detected Not Detected   Enteropathogenic E coli Not Detected Not Detected   Enterotoxigenic E coli Not Detected Not Detected   Shiga-toxin-producing E coli Not Detected Not Detected   E coli [id number] Not applicable Not Detected   Shigella/Enteroinvasive E coli Not Detected Not Detected   Cryptosporidium Not Detected Not Detected   Cyclospora  cayetanensis Not Detected Not Detected   Entamoeba histolytica Not Detected Not Detected   Giardia lamblia Not Detected Not Detected   Adenovirus F 40/41 Not Detected Not Detected   Astrovirus Not Detected Not Detected   Norovirus GI/GII Not Detected Not Detected   Rotavirus A Not Detected Not Detected   Sapovirus Not Detected Not Detected  UA/M w/rflx Culture, Routine     Status: Abnormal   Collection Time: 04/18/19 12:00 AM   Specimen: Urine   URINE  Result Value Ref Range   Specific Gravity, UA 1.022 1.005 - 1.030   pH, UA 5.0 5.0 - 7.5   Color, UA Yellow Yellow   Appearance Ur Clear Clear   Leukocytes,UA Negative Negative   Protein,UA Negative Negative/Trace   Glucose, UA Negative Negative   Ketones, UA Trace (A) Negative   RBC, UA Negative Negative   Bilirubin, UA Negative Negative   Urobilinogen, Ur 0.2 0.2 - 1.0 mg/dL   Nitrite, UA Negative Negative   Microscopic Examination Comment     Comment: Microscopic follows if indicated.   Microscopic Examination See below:     Comment: Microscopic was indicated and was performed.   Urinalysis Reflex Comment     Comment: This specimen will not reflex to a Urine Culture.  Microscopic Examination     Status: Abnormal   Collection Time: 04/18/19 12:00 AM   URINE  Result Value Ref Range   WBC, UA 0-5 0 - 5 /hpf   RBC None seen 0 - 2 /hpf   Epithelial Cells (non renal) >10 (A) 0 - 10 /hpf   Casts None seen None seen /lpf   Crystals Present (A) N/A   Crystal Type Calcium Oxalate N/A  Mucus, UA Present Not Estab.   Bacteria, UA None seen None seen/Few    Assessment/Plan: 1. Encounter for general adult medical examination with abnormal findings - Continue therapy for multiple medical problems. Pt is in chronic stable condition - Lipid Panel With LDL/HDL Ratio; Future  2. Hypothyroidism, unspecified type - Continue follow up with endocrinology. Continue synthroid  - TSH; Future - T4, free; Future  3. Essential  hypertension, benign - BP is under good control  - CBC with Differential/Platelet; Future - Comprehensive metabolic panel  4. Genital herpes simplex, unspecified site - Diagnosed by urology, continue on chronic suppressive therapy with valtrex. Monitor acute flare ups as well  5. Encounter for mammogram to establish baseline mammogram - MM DIGITAL SCREENING BILATERAL; Future  6. Dysuria - UA/M w/rflx Culture, Routine - Microscopic Examination General Counseling: stuart paris understanding of the findings of todays visit and agrees with plan of treatment. I have discussed any further diagnostic evaluation that may be needed or ordered today. We also reviewed her medications today. she has been encouraged to call the office with any questions or concerns that should arise related to todays visit.   Orders Placed This Encounter  Procedures  . Microscopic Examination  . MM DIGITAL SCREENING BILATERAL  . UA/M w/rflx Culture, Routine  . CBC with Differential/Platelet  . Lipid Panel With LDL/HDL Ratio  . TSH  . T4, free  . Comprehensive metabolic panel   Time [id number] G[place], MD  Internal Medicine

## 2019-04-18 NOTE — Telephone Encounter (Signed)
Patient scheduled at [facility] for mammogram on 05/01/2019 @ 1:15pm. [phone]

## 2019-04-19 LAB — MICROSCOPIC EXAMINATION
Bacteria, UA: NONE SEEN
Casts: NONE SEEN /lpf
Epithelial Cells (non renal): >10 /hpf — AB (ref 0–10)
RBC, Urine: NONE SEEN /hpf (ref 0–2)

## 2019-04-19 LAB — UA/M W/RFLX CULTURE, ROUTINE
Bilirubin, UA: NEGATIVE
Glucose, UA: NEGATIVE
Leukocytes,UA: NEGATIVE
Nitrite, UA: NEGATIVE
Protein,UA: NEGATIVE
RBC, UA: NEGATIVE
Specific Gravity, UA: 1.022 (ref 1.005–1.030)
Urobilinogen, Ur: 0.2 mg/dL (ref 0.2–1.0)
pH, UA: 5 (ref 5.0–7.5)

## 2019-04-27 ENCOUNTER — Ambulatory Visit: Payer: Medicare HMO | Admitting: Nurse Practitioner

## 2019-05-08 ENCOUNTER — Other Ambulatory Visit: Payer: Self-pay

## 2019-05-08 DIAGNOSIS — E039 Hypothyroidism, unspecified: Secondary | ICD-10-CM

## 2019-05-08 DIAGNOSIS — R300 Dysuria: Secondary | ICD-10-CM | POA: Diagnosis not present

## 2019-05-08 DIAGNOSIS — Z0001 Encounter for general adult medical examination with abnormal findings: Secondary | ICD-10-CM | POA: Diagnosis not present

## 2019-05-08 DIAGNOSIS — Z1231 Encounter for screening mammogram for malignant neoplasm of breast: Secondary | ICD-10-CM | POA: Diagnosis not present

## 2019-05-08 DIAGNOSIS — I10 Essential (primary) hypertension: Secondary | ICD-10-CM

## 2019-05-09 LAB — COMPREHENSIVE METABOLIC PANEL
ALT: 16 IU/L (ref 0–32)
AST: 22 IU/L (ref 0–40)
Albumin/Globulin Ratio: 2.1 (ref 1.2–2.2)
Albumin: 4.1 g/dL (ref 3.7–4.7)
Alkaline Phosphatase: 73 IU/L (ref 39–117)
BUN/Creatinine Ratio: 20 (ref 12–28)
BUN: 14 mg/dL (ref 8–27)
Bilirubin Total: 0.4 mg/dL (ref 0.0–1.2)
CO2: 27 mmol/L (ref 20–29)
Calcium: 9.3 mg/dL (ref 8.7–10.3)
Chloride: 102 mmol/L (ref 96–106)
Creatinine, Ser: 0.7 mg/dL (ref 0.57–1.00)
GFR calc Af Amer: 98 mL/min/{1.73_m2} (ref >59)
GFR calc non Af Amer: 85 mL/min/{1.73_m2} (ref >59)
Globulin, Total: 2 g/dL (ref 1.5–4.5)
Glucose: 84 mg/dL (ref 65–99)
Potassium: 4.1 mmol/L (ref 3.5–5.2)
Sodium: 141 mmol/L (ref 134–144)
Total Protein: 6.1 g/dL (ref 6.0–8.5)

## 2019-05-16 DIAGNOSIS — Z1231 Encounter for screening mammogram for malignant neoplasm of breast: Secondary | ICD-10-CM | POA: Diagnosis not present

## 2019-05-18 DIAGNOSIS — Z0001 Encounter for general adult medical examination with abnormal findings: Secondary | ICD-10-CM | POA: Diagnosis not present

## 2019-05-18 DIAGNOSIS — E0789 Other specified disorders of thyroid: Secondary | ICD-10-CM | POA: Diagnosis not present

## 2019-05-18 DIAGNOSIS — E756 Lipid storage disorder, unspecified: Secondary | ICD-10-CM | POA: Diagnosis not present

## 2019-05-19 LAB — CBC WITH DIFFERENTIAL/PLATELET
Basophils Absolute: 0 10*3/uL (ref 0.0–0.2)
Basos: 1 %
EOS (ABSOLUTE): 0.1 10*3/uL (ref 0.0–0.4)
Eos: 2 %
Hematocrit: 39.4 % (ref 34.0–46.6)
Hemoglobin: 13.2 g/dL (ref 11.1–15.9)
Immature Grans (Abs): 0 10*3/uL (ref 0.0–0.1)
Immature Granulocytes: 0 %
Lymphocytes Absolute: 2 10*3/uL (ref 0.7–3.1)
Lymphs: 38 %
MCH: 29.9 pg (ref 26.6–33.0)
MCHC: 33.5 g/dL (ref 31.5–35.7)
MCV: 89 fL (ref 79–97)
Monocytes Absolute: 0.6 10*3/uL (ref 0.1–0.9)
Monocytes: 12 %
Neutrophils Absolute: 2.5 10*3/uL (ref 1.4–7.0)
Neutrophils: 47 %
Platelets: 232 10*3/uL (ref 150–450)
RBC: 4.41 x10E6/uL (ref 3.77–5.28)
RDW: 13 % (ref 11.7–15.4)
WBC: 5.2 10*3/uL (ref 3.4–10.8)

## 2019-05-19 LAB — TSH: TSH: 0.943 u[IU]/mL (ref 0.450–4.500)

## 2019-05-19 LAB — LIPID PANEL WITH LDL/HDL RATIO
Cholesterol, Total: 165 mg/dL (ref 100–199)
HDL: 68 mg/dL (ref >39)
LDL Chol Calc (NIH): 79 mg/dL (ref 0–99)
LDL/HDL Ratio: 1.2 ratio (ref 0.0–3.2)
Triglycerides: 102 mg/dL (ref 0–149)
VLDL Cholesterol Cal: 18 mg/dL (ref 5–40)

## 2019-05-19 LAB — T4, FREE: Free T4: 2.11 ng/dL — ABNORMAL HIGH (ref 0.82–1.77)

## 2019-06-07 DIAGNOSIS — I491 Atrial premature depolarization: Secondary | ICD-10-CM | POA: Diagnosis not present

## 2019-06-07 DIAGNOSIS — R002 Palpitations: Secondary | ICD-10-CM | POA: Diagnosis not present

## 2019-06-07 DIAGNOSIS — I10 Essential (primary) hypertension: Secondary | ICD-10-CM | POA: Diagnosis not present

## 2019-06-07 DIAGNOSIS — I340 Nonrheumatic mitral (valve) insufficiency: Secondary | ICD-10-CM | POA: Diagnosis not present

## 2019-06-07 DIAGNOSIS — I493 Ventricular premature depolarization: Secondary | ICD-10-CM | POA: Diagnosis not present

## 2019-06-07 DIAGNOSIS — I519 Heart disease, unspecified: Secondary | ICD-10-CM | POA: Diagnosis not present

## 2019-07-03 ENCOUNTER — Telehealth: Payer: Self-pay | Admitting: Urology

## 2019-07-03 NOTE — Telephone Encounter (Signed)
Spoke with Amanda Livingston regarding her concerns about taking Valtrex 500 mg daily for suppressive therapy for genital herpes and kidney function. We discussed her options of discontinuing the suppressive therapy and just taking the Valtrex when she experiences a flare, but she states she may not realize she is having an outbreak.   Her serum creatinine in December 2020 was 0.70 which is excellent, so she will continue the Valtrex 500 mg daily and have her renal function checked on a periodic basis.

## 2019-07-03 NOTE — Telephone Encounter (Signed)
I left word on Mrs. Edgett to return my phone call.

## 2019-07-03 NOTE — Telephone Encounter (Signed)
Pt called asking to have [facility] call her at her earliest time, pt would like to discuss medication Rx. Pt would not elaborate. Please advise pt at [phone]

## 2019-07-07 DIAGNOSIS — C73 Malignant neoplasm of thyroid gland: Secondary | ICD-10-CM | POA: Diagnosis not present

## 2019-07-07 DIAGNOSIS — E559 Vitamin D deficiency, unspecified: Secondary | ICD-10-CM | POA: Diagnosis not present

## 2019-07-07 DIAGNOSIS — E890 Postprocedural hypothyroidism: Secondary | ICD-10-CM | POA: Diagnosis not present

## 2019-07-11 DIAGNOSIS — Z79899 Other long term (current) drug therapy: Secondary | ICD-10-CM | POA: Diagnosis not present

## 2019-07-11 DIAGNOSIS — Z85850 Personal history of malignant neoplasm of thyroid: Secondary | ICD-10-CM | POA: Diagnosis not present

## 2019-07-11 DIAGNOSIS — E890 Postprocedural hypothyroidism: Secondary | ICD-10-CM | POA: Insufficient documentation

## 2019-07-17 ENCOUNTER — Telehealth: Payer: Self-pay | Admitting: Urology

## 2019-07-17 ENCOUNTER — Other Ambulatory Visit: Payer: Self-pay | Admitting: Urology

## 2019-07-17 DIAGNOSIS — Z79899 Other long term (current) drug therapy: Secondary | ICD-10-CM

## 2019-07-17 NOTE — Telephone Encounter (Signed)
Patient called the office today requesting a lab appointment for kidney function test.  I read through your last note.   Please place order.  Patient is scheduled for 07/18/19.

## 2019-07-18 ENCOUNTER — Other Ambulatory Visit: Payer: Medicare HMO

## 2019-07-18 ENCOUNTER — Other Ambulatory Visit: Payer: Self-pay

## 2019-07-18 DIAGNOSIS — Z79899 Other long term (current) drug therapy: Secondary | ICD-10-CM | POA: Diagnosis not present

## 2019-07-19 LAB — BASIC METABOLIC PANEL WITH GFR
BUN/Creatinine Ratio: 21 (ref 12–28)
BUN: 16 mg/dL (ref 8–27)
CO2: 25 mmol/L (ref 20–29)
Calcium: 9.8 mg/dL (ref 8.7–10.3)
Chloride: 101 mmol/L (ref 96–106)
Creatinine, Ser: 0.77 mg/dL (ref 0.57–1.00)
GFR calc Af Amer: 87 mL/min/1.73
GFR calc non Af Amer: 75 mL/min/1.73
Glucose: 94 mg/dL (ref 65–99)
Potassium: 4.8 mmol/L (ref 3.5–5.2)
Sodium: 138 mmol/L (ref 134–144)

## 2019-07-26 ENCOUNTER — Ambulatory Visit: Payer: Medicare HMO | Admitting: Urology

## 2019-08-17 ENCOUNTER — Ambulatory Visit: Payer: Medicare HMO | Admitting: Nurse Practitioner

## 2019-08-29 ENCOUNTER — Telehealth: Payer: Self-pay

## 2019-08-29 NOTE — Telephone Encounter (Signed)
Lmom to confirm and screen for 08-31-19 ov. 

## 2019-08-31 ENCOUNTER — Other Ambulatory Visit: Payer: Self-pay

## 2019-08-31 ENCOUNTER — Ambulatory Visit (INDEPENDENT_AMBULATORY_CARE_PROVIDER_SITE_OTHER): Payer: Medicare HMO | Admitting: Nurse Practitioner

## 2019-08-31 ENCOUNTER — Encounter: Payer: Self-pay | Admitting: Nurse Practitioner

## 2019-08-31 VITALS — BP 130/74 | HR 63 | Temp 97.3°F | Resp 16 | Ht 63.0 in | Wt 131.0 lb

## 2019-08-31 DIAGNOSIS — R300 Dysuria: Secondary | ICD-10-CM

## 2019-08-31 DIAGNOSIS — E039 Hypothyroidism, unspecified: Secondary | ICD-10-CM

## 2019-08-31 DIAGNOSIS — I10 Essential (primary) hypertension: Secondary | ICD-10-CM

## 2019-08-31 LAB — POCT URINALYSIS DIPSTICK
Bilirubin, UA: NEGATIVE
Blood, UA: NEGATIVE
Glucose, UA: NEGATIVE
Ketones, UA: NEGATIVE
Leukocytes, UA: NEGATIVE
Nitrite, UA: NEGATIVE
Protein, UA: NEGATIVE
Spec Grav, UA: 1.01 (ref 1.010–1.025)
Urobilinogen, UA: 0.2 E.U./dL
pH, UA: 6.5 (ref 5.0–8.0)

## 2019-08-31 NOTE — Progress Notes (Signed)
[facility] [place], [place] [address]  Internal MEDICINE  Office Visit Note  Patient Name: Amanda Livingston  [id number]  [id number]  Date of Service: 09/18/2019  Chief Complaint  Patient presents with  . Hypertension  . Hypothyroidism  . Arthritis    The patient is here for routine follow up. She has frequent uti. Urine sample is negative for infection or other abnormalities. The patient is currently taking valtrex. She would like ot have renal functions checked. She is concerned as valtrex can damage kidneys. Last check was 1 month ago and 2 months ago. Both were normal.  Generalized itching on her back. Did see her dermatology last year. Was told to use sarma lotion and hydrocortisone cream. Helps for a little while but itching will come right back. Chronic constipation. Has to take miralax and stool softeners.       Current Medication: Outpatient Encounter Medications as of 08/31/2019  Medication Sig  . Biotin 1000 MCG tablet Take 1,000 mcg by mouth daily.  . Calcium 500 MG CHEW Chew 500 mg by mouth daily.   . Cholecalciferol (VITAMIN D3) 1000 units CAPS Take 1,000 Units by mouth every other day.   . cloNIDine (CATAPRES) 0.1 MG tablet Take 1 tablet (0.1 mg total) by mouth 2 (two) times daily as needed.  . conjugated estrogens (PREMARIN) vaginal cream Apply 0.5mg  (pea-sized amount)  just inside the vaginal introitus with a finger-tip on  Monday, Wednesday and Friday nights.  . estradiol (ESTRACE) 0.5 MG tablet Take 1 tablet (0.5 mg total) by mouth daily.  Marland Kitchen levothyroxine (SYNTHROID) 137 MCG tablet Take 137 mcg by mouth daily before breakfast.   . losartan (COZAAR) 25 MG tablet Take 1 tablet (25 mg total) by mouth 2 (two) times daily.  . metoprolol succinate (TOPROL-XL) 25 MG 24 hr tablet Take 12.5 mg by mouth daily.   Marland Kitchen oxybutynin (DITROPAN) 5 MG tablet Take 1 tablet (5 mg total) by mouth every 8 (eight) hours as needed for bladder spasms.  Marland Kitchen tretinoin  (RETIN-A) 0.025 % cream Apply 1 application topically daily as needed.  . valACYclovir (VALTREX) 500 MG tablet Take 1 tablet (500 mg total) by mouth daily.  . vitamin C (ASCORBIC ACID) 500 MG tablet Take 500 mg by mouth daily.   No facility-administered encounter medications on file as of 08/31/2019.    Surgical History: Past Surgical History:  Procedure Laterality Date  . ABDOMINAL HYSTERECTOMY    . colonscopy  2015  . CYSTOSCOPY W/ RETROGRADES Bilateral 10/31/2018   Procedure: CYSTOSCOPY WITH RETROGRADE PYELOGRAM;  Surgeon: Hollice Espy, MD;  Location: [facility];  Service: Urology;  Laterality: Bilateral;  . CYSTOSCOPY WITH BIOPSY N/A 10/31/2018   Procedure: CYSTOSCOPY WITH Bladder BIOPSY;  Surgeon: Hollice Espy, MD;  Location: [facility];  Service: Urology;  Laterality: N/A;  . ESOPHAGOGASTRODUODENOSCOPY (EGD) WITH PROPOFOL N/A 09/30/2017   Procedure: ESOPHAGOGASTRODUODENOSCOPY (EGD) WITH PROPOFOL;  Surgeon: Virgel Manifold, MD;  Location: [facility];  Service: Endoscopy;  Laterality: N/A;  . NEPHRECTOMY Left   . THYROIDECTOMY    . TONSILLECTOMY Right     Medical History: Past Medical History:  Diagnosis Date  . Arthritis   . Complication of anesthesia   . Dysrhythmia   . Family history of adverse reaction to anesthesia    PONV  . Hypertension   . Hypothyroidism   . Osteopenia   . PONV (postoperative nausea and vomiting)   . 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[place])    thyroid  Family History: Family History  Problem Relation Age of Onset  . Cancer Father     Social History   Socioeconomic History  . Marital status: Divorced    Spouse name: Not on file  . Number of children: Not on file  . Years of education: Not on file  . Highest education level: Not on file  Occupational History  . Not on file  Tobacco Use  . Smoking status: Current Some Day Smoker    Packs/day: 0.25    Types: Cigarettes  . Smokeless tobacco: Never Used  . Tobacco comment: 3 or 4 cigarettes a  day due to stress, trying to quit  Substance and Sexual Activity  . Alcohol use: Yes    Alcohol/week: 7.0 standard drinks    Types: 7 Glasses of wine per week    Comment: half a glass full a few time a week  . Drug use: No  . Sexual activity: Not on file  Other Topics Concern  . Not on file  Social History Narrative  . Not on file   Social Determinants of Health   Financial Resource Strain:   . Difficulty of Paying Living Expenses:   Food Insecurity:   . Worried About Charity fundraiser in the Last Year:   . Arboriculturist in the Last Year:   Transportation Needs:   . Film/video editor (Medical):   Marland Kitchen Lack of Transportation (Non-Medical):   Physical Activity:   . Days of Exercise per Week:   . Minutes of Exercise per Session:   Stress:   . Feeling of Stress :   Social Connections:   . Frequency of Communication with Friends and Family:   . Frequency of Social Gatherings with Friends and Family:   . Attends Religious Services:   . Active Member of Clubs or Organizations:   . Attends Archivist Meetings:   Marland Kitchen Marital Status:   Intimate Partner Violence:   . Fear of Current or Ex-Partner:   . Emotionally Abused:   Marland Kitchen Physically Abused:   . Sexually Abused:       Review of Systems  Constitutional: Negative for chills, fatigue and unexpected weight change.  HENT: Negative for congestion, rhinorrhea, sneezing and sore throat.   Respiratory: Negative for cough, chest tightness and shortness of breath.   Cardiovascular: Negative for chest pain and palpitations.  Gastrointestinal: Positive for constipation. Negative for abdominal pain, diarrhea, nausea and vomiting.  Endocrine: Negative for cold intolerance, heat intolerance, polydipsia and polyuria.  Genitourinary: Negative for dysuria, flank pain, frequency and pelvic pain.       History of frequent UTI  Musculoskeletal: Negative for arthralgias, back pain, joint swelling and neck pain.  Skin: Negative for  rash.  Allergic/Immunologic: Negative for environmental allergies.  Neurological: Negative for dizziness, tremors, numbness and headaches.  Hematological: Negative for adenopathy. Does not bruise/bleed easily.  Psychiatric/Behavioral: Negative for behavioral problems and sleep disturbance. The patient is nervous/anxious.     Today's Vitals   08/31/19 1156  BP: 130/74  Pulse: 63  Resp: 16  Temp: (!) 97.3 F (36.3 C)  SpO2: 98%  Weight: 131 lb (59.4 kg)  Height: 5\' 3"  (1.6 m)   Body mass index is 23.21 kg/m.  Physical Exam Vitals and nursing note reviewed.  Constitutional:      General: She is not in acute distress.    Appearance: Normal appearance. She is well-developed. She is not diaphoretic.  HENT:     Head:  Normocephalic and atraumatic.     Mouth/Throat:     Pharynx: No oropharyngeal exudate.  Eyes:     Pupils: Pupils are equal, round, and reactive to light.  Neck:     Thyroid: No thyromegaly.     Vascular: No JVD.     Trachea: No tracheal deviation.  Cardiovascular:     Rate and Rhythm: Normal rate. Rhythm irregular.     Heart sounds: Normal heart sounds. No murmur. No friction rub. No gallop.   Pulmonary:     Effort: Pulmonary effort is normal. No respiratory distress.     Breath sounds: Normal breath sounds. No wheezing or rales.  Chest:     Chest wall: No tenderness.  Abdominal:     General: Bowel sounds are normal.     Palpations: Abdomen is soft.  Genitourinary:    Comments: Urine sample negative for evidence of infection or other abnormalities.  Musculoskeletal:        General: Normal range of motion.     Cervical back: Normal range of motion and neck supple.  Lymphadenopathy:     Cervical: No cervical adenopathy.  Skin:    General: Skin is warm and dry.  Neurological:     General: No focal deficit present.     Mental Status: She is alert and oriented to person, place, and time.     Cranial Nerves: No cranial nerve deficit.  Psychiatric:         Attention and Perception: Attention and perception normal.        Mood and Affect: Affect normal. Mood is anxious.        Speech: Speech normal.        Behavior: Behavior normal.        Thought Content: Thought content normal.        Cognition and Memory: Cognition and memory normal.        Judgment: Judgment normal.   Assessment/Plan: 1. Dysuria Urine sample negative for evidence of infection or other abnormalities at this time. Will send for culture and sensitivity and treat as indicated.  - POCT Urinalysis Dipstick - CULTURE, URINE COMPREHENSIVE  2. Essential hypertension, benign Stable. Continue bp medication as prescribed   3. Hypothyroidism, unspecified type conitnue levothyroxine as prescribed.   General Counseling: latosha miedema understanding of the findings of todays visit and agrees with plan of treatment. I have discussed any further diagnostic evaluation that may be needed or ordered today. We also reviewed her medications today. she has been encouraged to call the office with any questions or concerns that should arise related to todays visit.  This patient was seen by Leretha Pol FNP Collaboration with Dr Lavera Guise as a part of collaborative care agreement  Orders Placed This Encounter  Procedures  . CULTURE, URINE COMPREHENSIVE  . POCT Urinalysis Dipstick      Total time spent: 30 Minutes   Time spent includes review of chart, medications, test results, and follow up plan with the patient.      Dr Lavera Guise Internal medicine

## 2019-09-06 LAB — CULTURE, URINE COMPREHENSIVE

## 2019-09-08 ENCOUNTER — Telehealth: Payer: Self-pay

## 2019-09-08 NOTE — Telephone Encounter (Signed)
Lmom to confirm and screen for 09-12-19 ov.

## 2019-09-12 ENCOUNTER — Other Ambulatory Visit: Payer: Self-pay

## 2019-09-12 ENCOUNTER — Ambulatory Visit (INDEPENDENT_AMBULATORY_CARE_PROVIDER_SITE_OTHER): Payer: Medicare HMO | Admitting: Nurse Practitioner

## 2019-09-12 ENCOUNTER — Encounter: Payer: Self-pay | Admitting: Nurse Practitioner

## 2019-09-12 VITALS — BP 128/60 | HR 52 | Temp 97.3°F | Resp 16 | Ht 63.0 in | Wt 130.8 lb

## 2019-09-12 DIAGNOSIS — D171 Benign lipomatous neoplasm of skin and subcutaneous tissue of trunk: Secondary | ICD-10-CM | POA: Diagnosis not present

## 2019-09-12 NOTE — Progress Notes (Signed)
[facility] [place], [place] [address]  Internal MEDICINE  Office Visit Note  Patient Name: Amanda Livingston  [id number]  [id number]  Date of Service: 09/24/2019   Pt is here for a sick visit.  Chief Complaint  Patient presents with  . Mass    near waist, left side of abdominal area     The patient presents for acute visit. Has noted mass in left lower aspect of the abdomen, just above the hip bone. Non tender. Can only be felt when standing up. She states that she has just noted this a few days. She notes no abdominal pain, nausea, vomiting, or diarrhea. She denies fever, headache, or shills.        Current Medication:  Outpatient Encounter Medications as of 09/12/2019  Medication Sig  . Biotin 1000 MCG tablet Take 1,000 mcg by mouth daily.  . Calcium 500 MG CHEW Chew 500 mg by mouth daily.   . Cholecalciferol (VITAMIN D3) 1000 units CAPS Take 1,000 Units by mouth every other day.   . cloNIDine (CATAPRES) 0.1 MG tablet Take 1 tablet (0.1 mg total) by mouth 2 (two) times daily as needed.  . conjugated estrogens (PREMARIN) vaginal cream Apply 0.5mg  (pea-sized amount)  just inside the vaginal introitus with a finger-tip on  Monday, Wednesday and Friday nights.  . estradiol (ESTRACE) 0.5 MG tablet Take 1 tablet (0.5 mg total) by mouth daily.  Marland Kitchen levothyroxine (SYNTHROID) 137 MCG tablet Take 137 mcg by mouth daily before breakfast.   . losartan (COZAAR) 25 MG tablet Take 1 tablet (25 mg total) by mouth 2 (two) times daily.  . metoprolol succinate (TOPROL-XL) 25 MG 24 hr tablet Take 12.5 mg by mouth daily.   Marland Kitchen oxybutynin (DITROPAN) 5 MG tablet Take 1 tablet (5 mg total) by mouth every 8 (eight) hours as needed for bladder spasms.  Marland Kitchen tretinoin (RETIN-A) 0.025 % cream Apply 1 application topically daily as needed.  . valACYclovir (VALTREX) 500 MG tablet Take 1 tablet (500 mg total) by mouth daily.  . vitamin C (ASCORBIC ACID) 500 MG tablet Take 500 mg by  mouth daily.   No facility-administered encounter medications on file as of 09/12/2019.      Medical History: Past Medical History:  Diagnosis Date  . Arthritis   . Complication of anesthesia   . Dysrhythmia   . Family history of adverse reaction to anesthesia    PONV  . Hypertension   . Hypothyroidism   . Osteopenia   . PONV (postoperative nausea and vomiting)   . 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[place])    thyroid     Today's Vitals   09/12/19 1057  BP: 128/60  Pulse: (!) 52  Resp: 16  Temp: (!) 97.3 F (36.3 C)  SpO2: 99%  Weight: 130 lb 12.8 oz (59.3 kg)  Height: 5\' 3"  (1.6 m)   Body mass index is 23.17 kg/m.  Review of Systems  Constitutional: Negative for chills, fatigue and unexpected weight change.  HENT: Negative for congestion, rhinorrhea, sneezing and sore throat.   Respiratory: Negative for cough, chest tightness and shortness of breath.   Cardiovascular: Negative for chest pain and palpitations.  Gastrointestinal: Positive for constipation. Negative for abdominal pain, diarrhea, nausea and vomiting.       There is palpable mass in left lower quadrant of the abdomen. The patient is not hurting. Has noted for a few days.   Endocrine: Negative for cold intolerance, heat intolerance, polydipsia and polyuria.  Genitourinary:  Negative for dysuria, flank pain, frequency and pelvic pain.       History of frequent UTI  Musculoskeletal: Negative for arthralgias, back pain, joint swelling and neck pain.  Skin: Negative for rash.  Allergic/Immunologic: Negative for environmental allergies.  Neurological: Negative for dizziness, tremors, numbness and headaches.  Hematological: Negative for adenopathy. Does not bruise/bleed easily.  Psychiatric/Behavioral: Negative for behavioral problems and sleep disturbance. The patient is nervous/anxious.     Physical Exam Vitals and nursing note reviewed.  Constitutional:      General: She is not in acute distress.    Appearance: Normal  appearance. She is well-developed. She is not diaphoretic.  HENT:     Head: Normocephalic and atraumatic.     Mouth/Throat:     Pharynx: No oropharyngeal exudate.  Eyes:     Pupils: Pupils are equal, round, and reactive to light.  Neck:     Thyroid: No thyromegaly.     Vascular: No JVD.     Trachea: No tracheal deviation.  Cardiovascular:     Rate and Rhythm: Normal rate and regular rhythm.     Heart sounds: Normal heart sounds. No murmur. No friction rub. No gallop.   Pulmonary:     Effort: Pulmonary effort is normal. No respiratory distress.     Breath sounds: Normal breath sounds. No wheezing or rales.  Chest:     Chest wall: No tenderness.  Abdominal:     General: Bowel sounds are normal.     Palpations: Abdomen is soft.     Tenderness: There is no abdominal tenderness.     Comments: There is palpable mass left lower quadrant of abdomen, just above the hip. Non-tender and not fixed. This is more noticeable when patient is standing up. Difficult to palpate when lying down.   Musculoskeletal:        General: Normal range of motion.     Cervical back: Normal range of motion and neck supple.  Lymphadenopathy:     Cervical: No cervical adenopathy.  Skin:    General: Skin is warm and dry.  Neurological:     Mental Status: She is alert and oriented to person, place, and time.     Cranial Nerves: No cranial nerve deficit.  Psychiatric:        Behavior: Behavior normal.        Thought Content: Thought content normal.        Judgment: Judgment normal.    Assessment/Plan: 1. Lipoma of skin of abdomen Will get ultrasound of the abdomen for further evaluation. Refer to specialist as indicated.  - US Abdomen Limited; Future  General Counseling: Amanda Livingston understanding of the findings of todays visit and agrees with plan of treatment. I have discussed any further diagnostic evaluation that may be needed or ordered today. We also reviewed her medications today. she has been  encouraged to call the office with any questions or concerns that should arise related to todays visit.    Counseling:  This patient was seen by Leretha Pol FNP Collaboration with Dr Lavera Guise as a part of collaborative care agreement  Orders Placed This Encounter  Procedures  . US Abdomen Limited     Time spent: 30 Minutes

## 2019-09-14 ENCOUNTER — Other Ambulatory Visit: Payer: Self-pay | Admitting: Nurse Practitioner

## 2019-09-14 DIAGNOSIS — R944 Abnormal results of kidney function studies: Secondary | ICD-10-CM | POA: Diagnosis not present

## 2019-09-14 DIAGNOSIS — E039 Hypothyroidism, unspecified: Secondary | ICD-10-CM | POA: Diagnosis not present

## 2019-09-15 LAB — BASIC METABOLIC PANEL
BUN/Creatinine Ratio: 20 (ref 12–28)
BUN: 14 mg/dL (ref 8–27)
CO2: 27 mmol/L (ref 20–29)
Calcium: 9.6 mg/dL (ref 8.7–10.3)
Chloride: 100 mmol/L (ref 96–106)
Creatinine, Ser: 0.69 mg/dL (ref 0.57–1.00)
GFR calc Af Amer: 98 mL/min/{1.73_m2} (ref >59)
GFR calc non Af Amer: 85 mL/min/{1.73_m2} (ref >59)
Glucose: 95 mg/dL (ref 65–99)
Potassium: 4.5 mmol/L (ref 3.5–5.2)
Sodium: 138 mmol/L (ref 134–144)

## 2019-09-15 LAB — T4, FREE: Free T4: 1.87 ng/dL — ABNORMAL HIGH (ref 0.82–1.77)

## 2019-09-15 LAB — TSH: TSH: 0.69 u[IU]/mL (ref 0.450–4.500)

## 2019-09-19 ENCOUNTER — Other Ambulatory Visit: Payer: Medicare HMO

## 2019-09-21 NOTE — Progress Notes (Signed)
Improved fre t4. Will continue to monitor. Discuss at visit5/24/2021

## 2019-09-24 DIAGNOSIS — D171 Benign lipomatous neoplasm of skin and subcutaneous tissue of trunk: Secondary | ICD-10-CM | POA: Insufficient documentation

## 2019-09-25 ENCOUNTER — Ambulatory Visit
Admission: RE | Admit: 2019-09-25 | Discharge: 2019-09-25 | Disposition: A | Discharge status: Home / Self Care | Payer: Medicare HMO | Source: Ambulatory Visit | Attending: Nurse Practitioner | Admitting: Nurse Practitioner

## 2019-09-25 ENCOUNTER — Other Ambulatory Visit: Payer: Self-pay

## 2019-09-25 DIAGNOSIS — D171 Benign lipomatous neoplasm of skin and subcutaneous tissue of trunk: Secondary | ICD-10-CM | POA: Insufficient documentation

## 2019-09-25 DIAGNOSIS — R1904 Left lower quadrant abdominal swelling, mass and lump: Secondary | ICD-10-CM | POA: Diagnosis not present

## 2019-09-28 ENCOUNTER — Telehealth: Payer: Self-pay

## 2019-09-28 NOTE — Telephone Encounter (Signed)
Confirmed and screened for 10-02-19 ov. 

## 2019-10-02 ENCOUNTER — Ambulatory Visit (INDEPENDENT_AMBULATORY_CARE_PROVIDER_SITE_OTHER): Payer: Medicare HMO | Admitting: Nurse Practitioner

## 2019-10-02 ENCOUNTER — Encounter: Payer: Self-pay | Admitting: Nurse Practitioner

## 2019-10-02 VITALS — BP 133/67 | HR 60 | Temp 97.3°F | Resp 16 | Ht 63.0 in | Wt 128.2 lb

## 2019-10-02 DIAGNOSIS — D171 Benign lipomatous neoplasm of skin and subcutaneous tissue of trunk: Secondary | ICD-10-CM

## 2019-10-02 NOTE — Progress Notes (Signed)
[facility] [place], [place] [address]  Internal MEDICINE  Office Visit Note  Patient Name: Amanda Livingston  [id number]  [id number]  Date of Service: 10/08/2019  Chief Complaint  Patient presents with  . Follow-up    Review ultrasound and labs   . Arthritis    The patient is here for follow up of ultrasound. She had noted mass in left lower aspect of the abdomen, just above the hip bone. Non tender. Can only be felt when standing up. Ultrasound indicated no definite mass,cyst, fluid collection or hernia. There is no change in the patient's symptoms. There is no tenderness, redness, pain, nausea, vomiting, or diarrhea.        Current Medication: Outpatient Encounter Medications as of 10/02/2019  Medication Sig  . Biotin 1000 MCG tablet Take 1,000 mcg by mouth daily.  . Calcium 500 MG CHEW Chew 500 mg by mouth daily.   . Cholecalciferol (VITAMIN D3) 1000 units CAPS Take 1,000 Units by mouth every other day.   . cloNIDine (CATAPRES) 0.1 MG tablet Take 1 tablet (0.1 mg total) by mouth 2 (two) times daily as needed.  . conjugated estrogens (PREMARIN) vaginal cream Apply 0.5mg  (pea-sized amount)  just inside the vaginal introitus with a finger-tip on  Monday, Wednesday and Friday nights.  . estradiol (ESTRACE) 0.5 MG tablet Take 1 tablet (0.5 mg total) by mouth daily.  Marland Kitchen levothyroxine (SYNTHROID) 137 MCG tablet Take 137 mcg by mouth daily before breakfast.   . losartan (COZAAR) 25 MG tablet Take 1 tablet (25 mg total) by mouth 2 (two) times daily.  . metoprolol succinate (TOPROL-XL) 25 MG 24 hr tablet Take 12.5 mg by mouth daily.   Marland Kitchen oxybutynin (DITROPAN) 5 MG tablet Take 1 tablet (5 mg total) by mouth every 8 (eight) hours as needed for bladder spasms.  Marland Kitchen tretinoin (RETIN-A) 0.025 % cream Apply 1 application topically daily as needed.  . valACYclovir (VALTREX) 500 MG tablet Take 1 tablet (500 mg total) by mouth daily.  . vitamin C (ASCORBIC ACID) 500 MG  tablet Take 500 mg by mouth daily.   No facility-administered encounter medications on file as of 10/02/2019.    Surgical History: Past Surgical History:  Procedure Laterality Date  . ABDOMINAL HYSTERECTOMY    . colonscopy  2015  . CYSTOSCOPY W/ RETROGRADES Bilateral 10/31/2018   Procedure: CYSTOSCOPY WITH RETROGRADE PYELOGRAM;  Surgeon: Hollice Espy, MD;  Location: [facility] ORS;  Service: Urology;  Laterality: Bilateral;  . CYSTOSCOPY WITH BIOPSY N/A 10/31/2018   Procedure: CYSTOSCOPY WITH Bladder BIOPSY;  Surgeon: Hollice Espy, MD;  Location: [facility] ORS;  Service: Urology;  Laterality: N/A;  . ESOPHAGOGASTRODUODENOSCOPY (EGD) WITH PROPOFOL N/A 09/30/2017   Procedure: ESOPHAGOGASTRODUODENOSCOPY (EGD) WITH PROPOFOL;  Surgeon: Virgel Manifold, MD;  Location: [facility] ENDOSCOPY;  Service: Endoscopy;  Laterality: N/A;  . NEPHRECTOMY Left   . THYROIDECTOMY    . TONSILLECTOMY Right     Medical History: Past Medical History:  Diagnosis Date  . Arthritis   . Complication of anesthesia   . Dysrhythmia   . Family history of adverse reaction to anesthesia    PONV  . Hypertension   . Hypothyroidism   . Osteopenia   . PONV (postoperative nausea and vomiting)   . 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[place])    thyroid    Family History: Family History  Problem Relation Age of Onset  . Cancer Father     Social History   Socioeconomic History  . Marital status: Divorced  Spouse name: Not on file  . Number of children: Not on file  . Years of education: Not on file  . Highest education level: Not on file  Occupational History  . Not on file  Tobacco Use  . Smoking status: Current Some Day Smoker    Packs/day: 0.25    Types: Cigarettes  . Smokeless tobacco: Never Used  . Tobacco comment: 3 or 4 cigarettes a day due to stress, trying to quit  Substance and Sexual Activity  . Alcohol use: Yes    Alcohol/week: 7.0 standard drinks    Types: 7 Glasses of wine per week    Comment: half a glass full a  few time a week  . Drug use: No  . Sexual activity: Not on file  Other Topics Concern  . Not on file  Social History Narrative  . Not on file   Social Determinants of Health   Financial Resource Strain:   . Difficulty of Paying Living Expenses:   Food Insecurity:   . Worried About Charity fundraiser in the Last Year:   . Arboriculturist in the Last Year:   Transportation Needs:   . Film/video editor (Medical):   Marland Kitchen Lack of Transportation (Non-Medical):   Physical Activity:   . Days of Exercise per Week:   . Minutes of Exercise per Session:   Stress:   . Feeling of Stress :   Social Connections:   . Frequency of Communication with Friends and Family:   . Frequency of Social Gatherings with Friends and Family:   . Attends Religious Services:   . Active Member of Clubs or Organizations:   . Attends Archivist Meetings:   Marland Kitchen Marital Status:   Intimate Partner Violence:   . Fear of Current or Ex-Partner:   . Emotionally Abused:   Marland Kitchen Physically Abused:   . Sexually Abused:       Review of Systems  Constitutional: Negative for activity change, chills, fatigue and unexpected weight change.  HENT: Negative for congestion, rhinorrhea, sneezing and sore throat.   Respiratory: Negative for cough, chest tightness and shortness of breath.   Cardiovascular: Negative for chest pain and palpitations.  Gastrointestinal: Positive for constipation. Negative for abdominal pain, diarrhea, nausea and vomiting.       There is palpable mass in left lower quadrant of the abdomen. The patient is not hurting. Has noted for a few days.   Endocrine: Negative for cold intolerance, heat intolerance, polydipsia and polyuria.  Musculoskeletal: Negative for arthralgias, back pain, joint swelling and neck pain.  Skin: Negative for rash.  Allergic/Immunologic: Negative for environmental allergies.  Neurological: Negative for dizziness, tremors, numbness and headaches.  Hematological:  Negative for adenopathy. Does not bruise/bleed easily.  Psychiatric/Behavioral: Negative for behavioral problems and sleep disturbance. The patient is nervous/anxious.     Today's Vitals   10/02/19 1152  BP: 133/67  Pulse: 60  Resp: 16  Temp: (!) 97.3 F (36.3 C)  SpO2: 97%  Weight: 128 lb 3.2 oz (58.2 kg)  Height: 5\' 3"  (1.6 m)   Body mass index is 22.71 kg/m.  Physical Exam Vitals and nursing note reviewed.  Constitutional:      General: She is not in acute distress.    Appearance: Normal appearance. She is well-developed. She is not diaphoretic.  HENT:     Head: Normocephalic and atraumatic.     Mouth/Throat:     Pharynx: No oropharyngeal exudate.  Eyes:  Pupils: Pupils are equal, round, and reactive to light.  Neck:     Thyroid: No thyromegaly.     Vascular: No JVD.     Trachea: No tracheal deviation.  Cardiovascular:     Rate and Rhythm: Normal rate and regular rhythm.     Heart sounds: Normal heart sounds. No murmur. No friction rub. No gallop.   Pulmonary:     Effort: Pulmonary effort is normal. No respiratory distress.     Breath sounds: Normal breath sounds. No wheezing or rales.  Chest:     Chest wall: No tenderness.  Abdominal:     General: Bowel sounds are normal.     Palpations: Abdomen is soft.     Tenderness: There is no abdominal tenderness.     Comments: There is palpable mass left lower quadrant of abdomen, just above the hip. Non-tender and not fixed. This is more noticeable when patient is standing up. Difficult to palpate when lying down.   Musculoskeletal:        General: Normal range of motion.     Cervical back: Normal range of motion and neck supple.  Lymphadenopathy:     Cervical: No cervical adenopathy.  Skin:    General: Skin is warm and dry.  Neurological:     Mental Status: She is alert and oriented to person, place, and time.     Cranial Nerves: No cranial nerve deficit.  Psychiatric:        Behavior: Behavior normal.         Thought Content: Thought content normal.        Judgment: Judgment normal.    Assessment/Plan: 1. Lipoma of skin of abdomen Reviewed results of ultrasound with the patient. There is no definite mass, cyst, fluid collection or hernia is noted. Will continue to monitor.    General Counseling: yanis brus understanding of the findings of todays visit and agrees with plan of treatment. I have discussed any further diagnostic evaluation that may be needed or ordered today. We also reviewed her medications today. she has been encouraged to call the office with any questions or concerns that should arise related to todays visit.  This patient was seen by Leretha Pol FNP Collaboration with Dr Lavera Guise as a part of collaborative care agreement   Total time spent: 20 Minutes   Time spent includes review of chart, medications, test results, and follow up plan with the patient.      Dr Lavera Guise Internal medicine

## 2019-10-18 DIAGNOSIS — D485 Neoplasm of uncertain behavior of skin: Secondary | ICD-10-CM | POA: Diagnosis not present

## 2019-10-18 DIAGNOSIS — I8393 Asymptomatic varicose veins of bilateral lower extremities: Secondary | ICD-10-CM | POA: Diagnosis not present

## 2019-10-18 DIAGNOSIS — R202 Paresthesia of skin: Secondary | ICD-10-CM | POA: Diagnosis not present

## 2019-10-18 DIAGNOSIS — L538 Other specified erythematous conditions: Secondary | ICD-10-CM | POA: Diagnosis not present

## 2019-10-18 DIAGNOSIS — D2362 Other benign neoplasm of skin of left upper limb, including shoulder: Secondary | ICD-10-CM | POA: Diagnosis not present

## 2019-10-18 DIAGNOSIS — Z7189 Other specified counseling: Secondary | ICD-10-CM | POA: Diagnosis not present

## 2019-10-18 DIAGNOSIS — L708 Other acne: Secondary | ICD-10-CM | POA: Diagnosis not present

## 2019-10-18 DIAGNOSIS — L820 Inflamed seborrheic keratosis: Secondary | ICD-10-CM | POA: Diagnosis not present

## 2019-10-18 DIAGNOSIS — D2372 Other benign neoplasm of skin of left lower limb, including hip: Secondary | ICD-10-CM | POA: Diagnosis not present

## 2019-10-30 ENCOUNTER — Telehealth: Payer: Self-pay

## 2019-10-30 NOTE — Telephone Encounter (Signed)
Authorization for PROMETHAZINE HCL Suppository is approved 05/12/2019 through 05/10/2020 [facility]

## 2019-11-09 ENCOUNTER — Other Ambulatory Visit: Payer: Medicare HMO | Admitting: Urology

## 2019-11-11 NOTE — Progress Notes (Signed)
° °  11/14/2019  CC:  Chief Complaint  Patient presents with   Cysto    HPI: Amanda Livingston is a 74 y.o. 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   She was taken to the operating room on 10/31/2018 for bladder biopsy/resection of this small 1 cm lesion.  Bilateral retrogrades unremarkable.  Surgical pathology consistent with PUNLMP.  She was last seen by Zara Council PA-C on 04/12/2019 for a recurrent UTI.   Microscopic urinalysis with reflex on 04/18/2019 revealed trace amounts of ketones. Examination showed epithelial cells (non renal) >10 and crystals were present.   Urine culture and urinalysis dipstick on 08/31/2019 were normal.   Abdominal ultrasound on 09/25/2019 revealed no definite sonographic abnormality seen in the area of palpable concern in the left lower quadrant of the abdomen.  She does have a personal history of smoking, quit about 15 years ago but recently resumed.   Blood pressure 122/66, pulse (!) 53, height 5\' 4"  (1.626 m), weight 128 lb (58.1 kg). NED. A&Ox3.   No respiratory distress   Abd soft, NT, ND Normal external genitalia with patent urethral meatus  Cystoscopy Procedure Note  Patient identification was confirmed, informed consent was obtained, and patient was prepped using Betadine solution.  Lidocaine jelly was administered per urethral meatus.    Procedure: - Flexible cystoscope introduced, without any difficulty.   - Thorough search of the bladder revealed:    normal urethral meatus    normal urothelium    no stones    no ulcers     no tumors    no urethral polyps    no trabeculation  - Stellate scar on left lateral bladder wall  - Ureteral orifices were normal in position and appearance.  Post-Procedure: - Patient tolerated the procedure well    Assessment/ Plan:  1. PUNLMP of bladder Given her family history patient agreed to continue annual surveillance.  Return for cysto appointment in  11/2020.    Return in about 1 year (around 11/13/2020) for 1year cysto.  Fransico Him, am acting as a scribe for Dr. Hollice Espy.  I have reviewed the above documentation for accuracy and completeness, and I agree with the above.   Hollice Espy, MD

## 2019-11-14 ENCOUNTER — Encounter: Payer: Self-pay | Admitting: Urology

## 2019-11-14 ENCOUNTER — Other Ambulatory Visit: Payer: Self-pay

## 2019-11-14 ENCOUNTER — Ambulatory Visit: Payer: Medicare HMO | Admitting: Urology

## 2019-11-14 VITALS — BP 122/66 | HR 53 | Ht 64.0 in | Wt 128.0 lb

## 2019-11-14 DIAGNOSIS — N959 Unspecified menopausal and perimenopausal disorder: Secondary | ICD-10-CM

## 2019-11-14 DIAGNOSIS — D414 Neoplasm of uncertain behavior of bladder: Secondary | ICD-10-CM | POA: Diagnosis not present

## 2019-11-14 MED ORDER — ESTRADIOL 0.5 MG PO TABS: 0.5000 mg | ORAL_TABLET | Freq: Every day | ORAL | 3 refills | Status: DC

## 2019-11-15 LAB — MICROSCOPIC EXAMINATION

## 2019-11-15 LAB — URINALYSIS, COMPLETE
Bilirubin, UA: NEGATIVE
Glucose, UA: NEGATIVE
Ketones, UA: NEGATIVE
Leukocytes,UA: NEGATIVE
Nitrite, UA: NEGATIVE
Protein,UA: NEGATIVE
RBC, UA: NEGATIVE
Specific Gravity, UA: 1.015 (ref 1.005–1.030)
Urobilinogen, Ur: 0.2 mg/dL (ref 0.2–1.0)
pH, UA: 7.5 (ref 5.0–7.5)

## 2019-12-05 DIAGNOSIS — I491 Atrial premature depolarization: Secondary | ICD-10-CM | POA: Diagnosis not present

## 2019-12-05 DIAGNOSIS — I10 Essential (primary) hypertension: Secondary | ICD-10-CM | POA: Diagnosis not present

## 2019-12-05 DIAGNOSIS — R002 Palpitations: Secondary | ICD-10-CM | POA: Diagnosis not present

## 2019-12-05 DIAGNOSIS — I340 Nonrheumatic mitral (valve) insufficiency: Secondary | ICD-10-CM | POA: Diagnosis not present

## 2019-12-05 DIAGNOSIS — I493 Ventricular premature depolarization: Secondary | ICD-10-CM | POA: Diagnosis not present

## 2019-12-05 DIAGNOSIS — I519 Heart disease, unspecified: Secondary | ICD-10-CM | POA: Diagnosis not present

## 2019-12-11 ENCOUNTER — Other Ambulatory Visit: Payer: Self-pay

## 2019-12-11 DIAGNOSIS — I10 Essential (primary) hypertension: Secondary | ICD-10-CM

## 2019-12-11 DIAGNOSIS — L819 Disorder of pigmentation, unspecified: Secondary | ICD-10-CM

## 2019-12-11 MED ORDER — TRETINOIN 0.025 % EX CREA: 1.0000 "application " | TOPICAL_CREAM | Freq: Every day | CUTANEOUS | 3 refills | Status: DC | PRN

## 2019-12-11 MED ORDER — LOSARTAN POTASSIUM 25 MG PO TABS: 25.0000 mg | ORAL_TABLET | Freq: Two times a day (BID) | ORAL | 4 refills | Status: AC

## 2019-12-15 ENCOUNTER — Telehealth: Payer: Self-pay

## 2019-12-15 NOTE — Telephone Encounter (Signed)
Authorization approved for Tretinoin Cream from 05/12/2019 through 05/10/2020 [facility]

## 2019-12-20 DIAGNOSIS — M25571 Pain in right ankle and joints of right foot: Secondary | ICD-10-CM | POA: Diagnosis not present

## 2019-12-25 DIAGNOSIS — M25571 Pain in right ankle and joints of right foot: Secondary | ICD-10-CM | POA: Diagnosis not present

## 2019-12-26 ENCOUNTER — Ambulatory Visit: Payer: Medicare HMO | Admitting: Podiatry

## 2019-12-28 ENCOUNTER — Ambulatory Visit (INDEPENDENT_AMBULATORY_CARE_PROVIDER_SITE_OTHER): Payer: Medicare HMO | Admitting: Nurse Practitioner

## 2019-12-28 ENCOUNTER — Other Ambulatory Visit: Payer: Self-pay

## 2019-12-28 ENCOUNTER — Encounter: Payer: Self-pay | Admitting: Nurse Practitioner

## 2019-12-28 VITALS — BP 103/53 | HR 58 | Temp 97.6°F | Resp 16 | Ht 63.0 in | Wt 130.0 lb

## 2019-12-28 DIAGNOSIS — I6523 Occlusion and stenosis of bilateral carotid arteries: Secondary | ICD-10-CM | POA: Diagnosis not present

## 2019-12-28 DIAGNOSIS — I10 Essential (primary) hypertension: Secondary | ICD-10-CM

## 2019-12-28 DIAGNOSIS — N3020 Other chronic cystitis without hematuria: Secondary | ICD-10-CM

## 2019-12-28 DIAGNOSIS — R300 Dysuria: Secondary | ICD-10-CM | POA: Diagnosis not present

## 2019-12-28 LAB — POCT URINALYSIS DIPSTICK
Bilirubin, UA: NEGATIVE
Blood, UA: NEGATIVE
Glucose, UA: NEGATIVE
Ketones, UA: NEGATIVE
Leukocytes, UA: NEGATIVE
Nitrite, UA: NEGATIVE
Protein, UA: NEGATIVE
Spec Grav, UA: <=1.005 — AB (ref 1.010–1.025)
Urobilinogen, UA: 0.2 E.U./dL
pH, UA: 6.5 (ref 5.0–8.0)

## 2019-12-28 MED ORDER — OXYBUTYNIN CHLORIDE 5 MG PO TABS: 5.0000 mg | ORAL_TABLET | Freq: Two times a day (BID) | ORAL | 2 refills | Status: DC | PRN

## 2019-12-28 NOTE — Progress Notes (Signed)
[facility] [place], [place] [address]  Internal MEDICINE  Office Visit Note  Patient Name: Amanda Livingston  [id number]  [id number]  Date of Service: 01/14/2020  Chief Complaint  Patient presents with  . Follow-up    possible uti, right ankel pain  . Hypertension  . Quality Metric Gaps    HepC, pna    The patient is here for routine follow up. She states that last week, she started having some bladder spasms and some burning with urination. This is intermittent. Does not happen all the time. She is concerned she may have urinary tract infection, as she has had frequent urinary tract infections in the past. She denies pelvic pain. She denies nausea, vomiting, or diarrhea. Her blood pressure is well managed. She has no other concerns or complaints today.       Current Medication: Outpatient Encounter Medications as of 12/28/2019  Medication Sig  . Biotin 1000 MCG tablet Take 1,000 mcg by mouth daily.  . Calcium 500 MG CHEW Chew 500 mg by mouth daily.   . Cholecalciferol (VITAMIN D3) 1000 units CAPS Take 1,000 Units by mouth every other day.   . cloNIDine (CATAPRES) 0.1 MG tablet Take 1 tablet (0.1 mg total) by mouth 2 (two) times daily as needed.  Marland Kitchen estradiol (ESTRACE) 0.5 MG tablet Take 1 tablet (0.5 mg total) by mouth daily.  Marland Kitchen levothyroxine (SYNTHROID) 137 MCG tablet Take 137 mcg by mouth daily before breakfast.   . losartan (COZAAR) 25 MG tablet Take 1 tablet (25 mg total) by mouth 2 (two) times daily.  . metoprolol succinate (TOPROL-XL) 25 MG 24 hr tablet Take 12.5 mg by mouth daily.   Marland Kitchen oxybutynin (DITROPAN) 5 MG tablet Take 1 tablet (5 mg total) by mouth 2 (two) times daily as needed for bladder spasms.  Marland Kitchen tretinoin (RETIN-A) 0.025 % cream Apply 1 application topically daily as needed.  . valACYclovir (VALTREX) 500 MG tablet Take 1 tablet (500 mg total) by mouth daily.  . vitamin C (ASCORBIC ACID) 500 MG tablet Take 500 mg by mouth daily.  .  [DISCONTINUED] oxybutynin (DITROPAN) 5 MG tablet Take 1 tablet (5 mg total) by mouth every 8 (eight) hours as needed for bladder spasms.  Marland Kitchen conjugated estrogens (PREMARIN) vaginal cream Apply 0.5mg  (pea-sized amount)  just inside the vaginal introitus with a finger-tip on  Monday, Wednesday and Friday nights. (Patient not taking: Reported on 12/28/2019)   No facility-administered encounter medications on file as of 12/28/2019.    Surgical History: Past Surgical History:  Procedure Laterality Date  . ABDOMINAL HYSTERECTOMY    . colonscopy  2015  . CYSTOSCOPY W/ RETROGRADES Bilateral 10/31/2018   Procedure: CYSTOSCOPY WITH RETROGRADE PYELOGRAM;  Surgeon: Hollice Espy, MD;  Location: [facility] ORS;  Service: Urology;  Laterality: Bilateral;  . CYSTOSCOPY WITH BIOPSY N/A 10/31/2018   Procedure: CYSTOSCOPY WITH Bladder BIOPSY;  Surgeon: Hollice Espy, MD;  Location: [facility] ORS;  Service: Urology;  Laterality: N/A;  . ESOPHAGOGASTRODUODENOSCOPY (EGD) WITH PROPOFOL N/A 09/30/2017   Procedure: ESOPHAGOGASTRODUODENOSCOPY (EGD) WITH PROPOFOL;  Surgeon: Virgel Manifold, MD;  Location: [facility] ENDOSCOPY;  Service: Endoscopy;  Laterality: N/A;  . NEPHRECTOMY Left   . THYROIDECTOMY    . TONSILLECTOMY Right     Medical History: Past Medical History:  Diagnosis Date  . Arthritis   . Complication of anesthesia   . Dysrhythmia   . Family history of adverse reaction to anesthesia    PONV  . Hypertension   .  Hypothyroidism   . Osteopenia   . PONV (postoperative nausea and vomiting)   . 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[place])    thyroid    Family History: Family History  Problem Relation Age of Onset  . Cancer Father     Social History   Socioeconomic History  . Marital status: Divorced    Spouse name: Not on file  . Number of children: Not on file  . Years of education: Not on file  . Highest education level: Not on file  Occupational History  . Not on file  Tobacco Use  . Smoking status: Current Some Day  Smoker    Packs/day: 0.25    Types: Cigarettes  . Smokeless tobacco: Never Used  . Tobacco comment: 3 or 4 cigarettes a day due to stress, trying to quit  Vaping Use  . Vaping Use: Never used  Substance and Sexual Activity  . Alcohol use: Yes    Alcohol/week: 7.0 standard drinks    Types: 7 Glasses of wine per week    Comment: half a glass full a few time a week  . Drug use: No  . Sexual activity: Not on file  Other Topics Concern  . Not on file  Social History Narrative  . Not on file   Social Determinants of Health   Financial Resource Strain:   . Difficulty of Paying Living Expenses: Not on file  Food Insecurity:   . Worried About Charity fundraiser in the Last Year: Not on file  . Ran Out of Food in the Last Year: Not on file  Transportation Needs:   . Lack of Transportation (Medical): Not on file  . Lack of Transportation (Non-Medical): Not on file  Physical Activity:   . Days of Exercise per Week: Not on file  . Minutes of Exercise per Session: Not on file  Stress:   . Feeling of Stress : Not on file  Social Connections:   . Frequency of Communication with Friends and Family: Not on file  . Frequency of Social Gatherings with Friends and Family: Not on file  . Attends Religious Services: Not on file  . Active Member of Clubs or Organizations: Not on file  . Attends Archivist Meetings: Not on file  . Marital Status: Not on file  Intimate Partner Violence:   . Fear of Current or Ex-Partner: Not on file  . Emotionally Abused: Not on file  . Physically Abused: Not on file  . Sexually Abused: Not on file      Review of Systems  Constitutional: Negative for activity change, chills, fatigue and unexpected weight change.  HENT: Negative for congestion, rhinorrhea, sneezing and sore throat.   Respiratory: Negative for cough, chest tightness and shortness of breath.   Cardiovascular: Negative for chest pain and palpitations.  Gastrointestinal: Positive  for constipation. Negative for abdominal pain, diarrhea, nausea and vomiting.  Endocrine: Negative for cold intolerance, heat intolerance, polydipsia and polyuria.  Genitourinary: Positive for dysuria, frequency and urgency.  Musculoskeletal: Negative for arthralgias, back pain, joint swelling and neck pain.  Skin: Negative for rash.  Allergic/Immunologic: Negative for environmental allergies.  Neurological: Negative for dizziness, tremors, numbness and headaches.  Hematological: Negative for adenopathy. Does not bruise/bleed easily.  Psychiatric/Behavioral: Negative for behavioral problems and sleep disturbance. The patient is nervous/anxious.     Today's Vitals   12/28/19 1439  BP: (!) 103/53  Pulse: (!) 58  Resp: 16  Temp: 97.6 F (36.4 C)  SpO2: 96%  Weight: 130 lb (59 kg)  Height: 5\' 3"  (1.6 m)   Body mass index is 23.03 kg/m.  Physical Exam Vitals and nursing note reviewed.  Constitutional:      General: She is not in acute distress.    Appearance: Normal appearance. She is well-developed. She is not diaphoretic.  HENT:     Head: Normocephalic and atraumatic.     Nose: Nose normal.     Mouth/Throat:     Pharynx: No oropharyngeal exudate.  Eyes:     Pupils: Pupils are equal, round, and reactive to light.  Neck:     Thyroid: No thyromegaly.     Vascular: Carotid bruit present. No JVD.     Trachea: No tracheal deviation.     Comments: Soft, blowing carotid bruit, bilaterally.  Cardiovascular:     Rate and Rhythm: Normal rate and regular rhythm.     Heart sounds: Normal heart sounds. No murmur heard.  No friction rub. No gallop.   Pulmonary:     Effort: Pulmonary effort is normal. No respiratory distress.     Breath sounds: Normal breath sounds. No wheezing or rales.  Chest:     Chest wall: No tenderness.  Abdominal:     General: Bowel sounds are normal.     Palpations: Abdomen is soft.     Tenderness: There is no abdominal tenderness.  Genitourinary:     Comments: Urine sample is negative for evidence of infection or other abnormalities.  Musculoskeletal:        General: Normal range of motion.     Cervical back: Normal range of motion and neck supple.  Lymphadenopathy:     Cervical: No cervical adenopathy.  Skin:    General: Skin is warm and dry.  Neurological:     General: No focal deficit present.     Mental Status: She is alert and oriented to person, place, and time.     Cranial Nerves: No cranial nerve deficit.  Psychiatric:        Mood and Affect: Mood normal.        Behavior: Behavior normal.        Thought Content: Thought content normal.        Judgment: Judgment normal.    Assessment/Plan: 1. Essential hypertension, benign Blood pressure is stable. Continue to monitor closely.   2. Dysuria - POCT Urinalysis Dipstick negative for evidence of infection or other abnormalities today.   3. Chronic cystitis Start oxybutynin 5mg  up to twice daily to reduce bladder and urinary frequency.  - oxybutynin (DITROPAN) 5 MG tablet; Take 1 tablet (5 mg total) by mouth 2 (two) times daily as needed for bladder spasms.  Dispense: 60 tablet; Refill: 2  4. Occlusion and stenosis of bilateral carotid arteries History of carotid artery occlusion. Will get new carotid doppler for surveillance.  - US Carotid Bilateral; Future  General Counseling: emmaleigh longo understanding of the findings of todays visit and agrees with plan of treatment. I have discussed any further diagnostic evaluation that may be needed or ordered today. We also reviewed her medications today. she has been encouraged to call the office with any questions or concerns that should arise related to todays visit.  This patient was seen by Leretha Pol FNP Collaboration with Dr Lavera Guise as a part of collaborative care agreement  Orders Placed This Encounter  Procedures  . US Carotid Bilateral  . POCT Urinalysis Dipstick    Meds ordered this encounter   Medications  .  oxybutynin (DITROPAN) 5 MG tablet    Sig: Take 1 tablet (5 mg total) by mouth 2 (two) times daily as needed for bladder spasms.    Dispense:  60 tablet    Refill:  2    Order Specific Question:   Supervising Provider    Answer:   Lavera Guise [[id number]]    Total time spent: 30 Minutes   Time spent includes review of chart, medications, test results, and follow up plan with the patient.      Dr Lavera Guise Internal medicine

## 2019-12-29 ENCOUNTER — Telehealth: Payer: Self-pay

## 2019-12-29 NOTE — Telephone Encounter (Signed)
She hust had labs done, includig kidney functions in 09/2019. Results were normal. Does she want this retested for some reason?

## 2019-12-29 NOTE — Telephone Encounter (Signed)
Pt is on valtrex and she is just wanting to make sure all is well and wants testing at least every 3 months bc she only has one kidney.  dbs

## 2020-01-01 ENCOUNTER — Ambulatory Visit: Payer: Medicare HMO | Admitting: Nurse Practitioner

## 2020-01-02 ENCOUNTER — Other Ambulatory Visit: Payer: Self-pay | Admitting: Nurse Practitioner

## 2020-01-02 DIAGNOSIS — N289 Disorder of kidney and ureter, unspecified: Secondary | ICD-10-CM

## 2020-01-02 NOTE — Telephone Encounter (Signed)
Pt informed that lab test was placed thru [facility].  dbs

## 2020-01-02 NOTE — Telephone Encounter (Signed)
I plced order in Epic for her to have BMP checked. She can go to [facility] anytime to have this checked.

## 2020-01-09 DIAGNOSIS — N289 Disorder of kidney and ureter, unspecified: Secondary | ICD-10-CM | POA: Diagnosis not present

## 2020-01-10 LAB — BASIC METABOLIC PANEL
BUN/Creatinine Ratio: 20 (ref 12–28)
BUN: 16 mg/dL (ref 8–27)
CO2: 27 mmol/L (ref 20–29)
Calcium: 9.3 mg/dL (ref 8.7–10.3)
Chloride: 101 mmol/L (ref 96–106)
Creatinine, Ser: 0.81 mg/dL (ref 0.57–1.00)
GFR calc Af Amer: 82 mL/min/{1.73_m2} (ref >59)
GFR calc non Af Amer: 71 mL/min/{1.73_m2} (ref >59)
Glucose: 88 mg/dL (ref 65–99)
Potassium: 4.8 mmol/L (ref 3.5–5.2)
Sodium: 138 mmol/L (ref 134–144)

## 2020-01-10 NOTE — Progress Notes (Signed)
Can you let the patient know that her labs were normal. (kidney functoins). Thanks.

## 2020-01-11 ENCOUNTER — Telehealth: Payer: Self-pay | Admitting: Urology

## 2020-01-11 NOTE — Telephone Encounter (Signed)
Pt wants to discuss lab results. Specifically her creatine levels.  Please advise.

## 2020-01-14 DIAGNOSIS — I6523 Occlusion and stenosis of bilateral carotid arteries: Secondary | ICD-10-CM | POA: Insufficient documentation

## 2020-01-17 NOTE — Telephone Encounter (Signed)
Spoke with patient and she would like me to call her in the morning as it was not a good time to speak.

## 2020-01-18 NOTE — Telephone Encounter (Signed)
I spoke with Amanda Livingston regarding her renal function.  I reviewed her labs over the last few years with her and explained to her that her renal function has been stable since 2017 and to continue taking the Valtrex.

## 2020-01-25 DIAGNOSIS — E890 Postprocedural hypothyroidism: Secondary | ICD-10-CM | POA: Diagnosis not present

## 2020-02-16 DIAGNOSIS — R69 Illness, unspecified: Secondary | ICD-10-CM | POA: Diagnosis not present

## 2020-02-21 NOTE — Progress Notes (Signed)
02/22/2020 6:24 PM   Amanda Livingston 29-Jan-1946 [id number]  Referring provider: Lavera Guise, [place],  [place] [address]  Chief Complaint  Patient presents with  . Recurrent UTI    HPI: Amanda Livingston is a 74 year old female with PUNLMP of the bladder who presents for bladder pain.    She states that a few weeks ago she started experiencing urethral type spasms in the evening.  She states she felt like her urine urethra with clenched and she could not urinate.  She would then get up from the commode walk around or sit or lie in the bed and then later on that evening she would be able to urinate without issue.  Patient denies any modifying or aggravating factors.  Patient denies any gross hematuria, dysuria or suprapubic/flank pain.  Patient denies any fevers, chills, nausea or vomiting.   She continues to take the suppressive Valtrex.  Surveillance cystoscopy with Dr. Erlene Quan on July 2021 was NED.    Her UA is positive for moderate bacteria.   PMH: Past Medical History:  Diagnosis Date  . Arthritis   . Complication of anesthesia   . Dysrhythmia   . Family history of adverse reaction to anesthesia    PONV  . Hypertension   . Hypothyroidism   . Osteopenia   . PONV (postoperative nausea and vomiting)   . Thyroid cancer [facility])    thyroid    Surgical History: Past Surgical History:  Procedure Laterality Date  . ABDOMINAL HYSTERECTOMY    . colonscopy  2015  . CYSTOSCOPY W/ RETROGRADES Bilateral 10/31/2018   Procedure: CYSTOSCOPY WITH RETROGRADE PYELOGRAM;  Surgeon: Hollice Espy, MD;  Location: [facility] ORS;  Service: Urology;  Laterality: Bilateral;  . CYSTOSCOPY WITH BIOPSY N/A 10/31/2018   Procedure: CYSTOSCOPY WITH Bladder BIOPSY;  Surgeon: Hollice Espy, MD;  Location: [facility] ORS;  Service: Urology;  Laterality: N/A;  . ESOPHAGOGASTRODUODENOSCOPY (EGD) WITH PROPOFOL N/A 09/30/2017   Procedure: ESOPHAGOGASTRODUODENOSCOPY (EGD) WITH PROPOFOL;  Surgeon:  Virgel Manifold, MD;  Location: [facility] ENDOSCOPY;  Service: Endoscopy;  Laterality: N/A;  . NEPHRECTOMY Left   . THYROIDECTOMY    . TONSILLECTOMY Right     Home Medications:  Allergies as of 02/22/2020      Reactions   Nitrofurantoin Rash      Medication List       Accurate as of February 22, 2020 11:59 PM. If you have any questions, ask your nurse or doctor.        STOP taking these medications   Premarin vaginal cream Generic drug: conjugated estrogens Stopped by: Zara Council, PA-C     TAKE these medications   amoxicillin-clavulanate 875-125 MG tablet Commonly known as: AUGMENTIN Take 1 tablet by mouth every 12 (twelve) hours. Started by: Zara Council, PA-C   Biotin 1000 MCG tablet Take 1,000 mcg by mouth daily.   Calcium 500 MG Chew Chew 500 mg by mouth daily.   cloNIDine 0.1 MG tablet Commonly known as: Catapres Take 1 tablet (0.1 mg total) by mouth 2 (two) times daily as needed.   estradiol 0.5 MG tablet Commonly known as: ESTRACE Take 1 tablet (0.5 mg total) by mouth daily.   losartan 25 MG tablet Commonly known as: COZAAR Take 1 tablet (25 mg total) by mouth 2 (two) times daily.   metoprolol succinate 25 MG 24 hr tablet Commonly known as: TOPROL-XL Take 12.5 mg by mouth daily.   oxybutynin 5 MG tablet Commonly known as:  DITROPAN Take 1 tablet (5 mg total) by mouth 2 (two) times daily as needed for bladder spasms.   Synthroid 137 MCG tablet Generic drug: levothyroxine Take 137 mcg by mouth daily before breakfast.   tretinoin 0.025 % cream Commonly known as: RETIN-A Apply 1 application topically daily as needed.   valACYclovir 500 MG tablet Commonly known as: Valtrex Take 1 tablet (500 mg total) by mouth daily.   vitamin C 500 MG tablet Commonly known as: ASCORBIC ACID Take 500 mg by mouth daily.   Vitamin D3 25 MCG (1000 UT) Caps Take 1,000 Units by mouth every other day.       Allergies:  Allergies  Allergen Reactions    . Nitrofurantoin Rash    Family History: Family History  Problem Relation Age of Onset  . Cancer Father     Social History:  reports that she has been smoking cigarettes. She has been smoking about 0.25 packs per day. She has never used smokeless tobacco. She reports current alcohol use of about 7.0 standard drinks of alcohol per week. She reports that she does not use drugs.  ROS: For pertinent review of systems please refer to history of present illness  Physical Exam: BP 137/84   Pulse 80   Ht 5' 3.5" (1.613 m)   Wt 125 lb (56.7 kg)   BMI 21.80 kg/m   Constitutional:  Well nourished. Alert and oriented, No acute distress. HEENT: [place] AT, mask in place.  Trachea midline Cardiovascular: No clubbing, cyanosis, or edema. Respiratory: Normal respiratory effort, no increased work of breathing. Neurologic: Grossly intact, no focal deficits, moving all 4 extremities. Psychiatric: Normal mood and affect.   Laboratory Data: Lab Results  Component Value Date   WBC 5.2 05/18/2019   HGB 13.2 05/18/2019   HCT 39.4 05/18/2019   MCV 89 05/18/2019   PLT 232 05/18/2019    Lab Results  Component Value Date   CREATININE 0.81 01/09/2020    Lab Results  Component Value Date   TSH 0.690 09/14/2019       Component Value Date/Time   CHOL 165 05/18/2019 0845   HDL 68 05/18/2019 0845   LDLCALC 79 05/18/2019 0845    Lab Results  Component Value Date   AST 22 05/08/2019   Lab Results  Component Value Date   ALT 16 05/08/2019   Urinalysis  Component     Latest Ref Rng & Units 02/22/2020          Specific Gravity, UA     1.005 - 1.030 1.010  pH, UA     5.0 - 7.5 5.5  Color, UA     Yellow Straw  Appearance Ur     Clear Hazy (A)  Leukocytes,UA     Negative Negative  Protein,UA     Negative/Trace Negative  Glucose, UA     Negative Negative  Ketones, UA     Negative Negative  RBC, UA     Negative Negative  Bilirubin, UA     Negative Negative  Urobilinogen, Ur      0.2 - 1.0 mg/dL 0.2  Nitrite, UA     Negative Negative  Microscopic Examination      See below:   Component     Latest Ref Rng & Units 02/22/2020  WBC, UA     0 - 5 /hpf 0-5  RBC     0 - 2 /hpf 0-2  Epithelial Cells (non renal)     0 - 10 /hpf  0-10  Bacteria, UA     None seen/Few Moderate (A)   I have reviewed the labs.  Assessment & Plan:    1. Genital herpes Continue suppressive therapy with Valtrex 500 mg daily   2. PUNLMP of bladder Recent cystoscopy January 2021 NED Patient has a cystoscopy scheduled for 11/2020   3. Urethral spasms UA with moderate bacteria Urine sent for culture Start Augmentin 875/125   Return for Keep follow-up appointment with Dr. Erlene Quan in July for surveillance cystoscopy.  These notes generated with voice recognition software. I apologize for typographical errors.  Zara Council, PA-C  [facility] [address]  [place], [place] [address] [phone]

## 2020-02-22 ENCOUNTER — Ambulatory Visit (INDEPENDENT_AMBULATORY_CARE_PROVIDER_SITE_OTHER): Payer: Medicare HMO | Admitting: Urology

## 2020-02-22 ENCOUNTER — Encounter: Payer: Self-pay | Admitting: Urology

## 2020-02-22 ENCOUNTER — Other Ambulatory Visit: Payer: Self-pay

## 2020-02-22 VITALS — BP 137/84 | HR 80 | Ht 63.5 in | Wt 125.0 lb

## 2020-02-22 DIAGNOSIS — A6009 Herpesviral infection of other urogenital tract: Secondary | ICD-10-CM | POA: Diagnosis not present

## 2020-02-22 DIAGNOSIS — D414 Neoplasm of uncertain behavior of bladder: Secondary | ICD-10-CM | POA: Diagnosis not present

## 2020-02-22 DIAGNOSIS — N35919 Unspecified urethral stricture, male, unspecified site: Secondary | ICD-10-CM

## 2020-02-22 DIAGNOSIS — N390 Urinary tract infection, site not specified: Secondary | ICD-10-CM | POA: Diagnosis not present

## 2020-02-22 DIAGNOSIS — R69 Illness, unspecified: Secondary | ICD-10-CM | POA: Diagnosis not present

## 2020-02-22 LAB — URINALYSIS, COMPLETE
Bilirubin, UA: NEGATIVE
Glucose, UA: NEGATIVE
Ketones, UA: NEGATIVE
Leukocytes,UA: NEGATIVE
Nitrite, UA: NEGATIVE
Protein,UA: NEGATIVE
RBC, UA: NEGATIVE
Specific Gravity, UA: 1.01 (ref 1.005–1.030)
Urobilinogen, Ur: 0.2 mg/dL (ref 0.2–1.0)
pH, UA: 5.5 (ref 5.0–7.5)

## 2020-02-22 LAB — MICROSCOPIC EXAMINATION

## 2020-02-22 MED ORDER — AMOXICILLIN-POT CLAVULANATE 875-125 MG PO TABS: 1.0000 | ORAL_TABLET | Freq: Two times a day (BID) | ORAL | 0 refills | Status: DC

## 2020-02-26 LAB — CULTURE, URINE COMPREHENSIVE

## 2020-03-08 ENCOUNTER — Other Ambulatory Visit: Payer: Self-pay

## 2020-03-08 DIAGNOSIS — N390 Urinary tract infection, site not specified: Secondary | ICD-10-CM

## 2020-03-09 NOTE — Progress Notes (Signed)
03/11/2020 1:58 PM   Amanda Livingston 1946-02-15 [id number]  Referring provider: Lavera Guise, [place],  [place] [address]  Chief Complaint  Patient presents with  . Follow-up    painful urination    HPI: Mrs. Amanda Livingston is a 74 year old female with PUNLMP of the bladder who presents for continued pain after an UTI.  She continues to take the suppressive Valtrex.  Surveillance cystoscopy with Dr. Erlene Quan on July 2021 was NED.  Urine culture positive for Streptococcus mitis/oralis group on 02/22/2020.    She continues to feel the "twinges" even after completing the antibiotics.  She notices that it occurs when she is engage in movement.   Patient denies any modifying or aggravating factors.  Patient denies any gross hematuria, dysuria or suprapubic/flank pain.  Patient denies any fevers, chills, nausea or vomiting.   UA today is negative.    PMH: Past Medical History:  Diagnosis Date  . Arthritis   . Complication of anesthesia   . Dysrhythmia   . Family history of adverse reaction to anesthesia    PONV  . Hypertension   . Hypothyroidism   . Osteopenia   . PONV (postoperative nausea and vomiting)   . Thyroid cancer [facility])    thyroid    Surgical History: Past Surgical History:  Procedure Laterality Date  . ABDOMINAL HYSTERECTOMY    . colonscopy  2015  . CYSTOSCOPY W/ RETROGRADES Bilateral 10/31/2018   Procedure: CYSTOSCOPY WITH RETROGRADE PYELOGRAM;  Surgeon: Hollice Espy, MD;  Location: [facility] OR[facility];  Service: Urology;  Laterality: Bilateral;  . CYSTOSCOPY WITH BIOPSY N/A 10/31/2018   Procedure: CYSTOSCOPY WITH Bladder BIOPSY;  Surgeon: Hollice Espy, MD;  Location: [facility];  Service: Urology;  Laterality: N/A;  . ESOPHAGOGASTRODUODENOSCOPY (EGD) WITH PROPOFOL N/A 09/30/2017   Procedure: ESOPHAGOGASTRODUODENOSCOPY (EGD) WITH PROPOFOL;  Surgeon: Virgel Manifold, MD;  Location: [facility] END[facility];  Service: Endoscopy;  Laterality: N/A;  . NEPHRECTOMY Left    . THYROIDECTOMY    . TONSILLECTOMY Right     Home Medications:  Allergies as of 03/11/2020      Reactions   Nitrofurantoin Rash      Medication List       Accurate as of March 11, 2020  1:58 PM. If you have any questions, ask your nurse or doctor.        amoxicillin-clavulanate 875-125 MG tablet Commonly known as: AUGMENTIN Take 1 tablet by mouth every 12 (twelve) hours.   Biotin 1000 MCG tablet Take 1,000 mcg by mouth daily.   Calcium 500 MG Chew Chew 500 mg by mouth daily.   cloNIDine 0.1 MG tablet Commonly known as: Catapres Take 1 tablet (0.1 mg total) by mouth 2 (two) times daily as needed.   estradiol 0.5 MG tablet Commonly known as: ESTRACE Take 1 tablet (0.5 mg total) by mouth daily.   losartan 25 MG tablet Commonly known as: COZAAR Take 1 tablet (25 mg total) by mouth 2 (two) times daily.   metoprolol succinate 25 MG 24 hr tablet Commonly known as: TOPROL-XL Take 12.5 mg by mouth daily.   oxybutynin 5 MG tablet Commonly known as: DITROPAN Take 1 tablet (5 mg total) by mouth 2 (two) times daily as needed for bladder spasms.   Synthroid 137 MCG tablet Generic drug: levothyroxine Take 137 mcg by mouth daily before breakfast.   tretinoin 0.025 % cream Commonly known as: RETIN-A Apply 1 application topically daily as needed.   valACYclovir 500 MG tablet  Commonly known as: Valtrex Take 1 tablet (500 mg total) by mouth daily.   vitamin C 500 MG tablet Commonly known as: ASCORBIC ACID Take 500 mg by mouth daily.   Vitamin D3 25 MCG (1000 UT) Caps Take 1,000 Units by mouth every other day.       Allergies:  Allergies  Allergen Reactions  . Nitrofurantoin Rash    Family History: Family History  Problem Relation Age of Onset  . Cancer Father     Social History:  reports that she has been smoking cigarettes. She has been smoking about 0.25 packs per day. She has never used smokeless tobacco. She reports current alcohol use of about 7.0  standard drinks of alcohol per week. She reports that she does not use drugs.  ROS: For pertinent review of systems please refer to history of present illness  Physical Exam: BP 124/67   Pulse 75   Ht 5\' 3"  (1.6 m)   Wt 125 lb (56.7 kg)   BMI 22.14 kg/m   Constitutional:  Well nourished. Alert and oriented, No acute distress. HEENT: [place] AT, mask in place.  Trachea midline Cardiovascular: No clubbing, cyanosis, or edema. Respiratory: Normal respiratory effort, no increased work of breathing. Neurologic: Grossly intact, no focal deficits, moving all 4 extremities. Psychiatric: Normal mood and affect.   Laboratory Data: Lab Results  Component Value Date   WBC 5.2 05/18/2019   HGB 13.2 05/18/2019   HCT 39.4 05/18/2019   MCV 89 05/18/2019   PLT 232 05/18/2019    Lab Results  Component Value Date   CREATININE 0.81 01/09/2020    Lab Results  Component Value Date   TSH 0.690 09/14/2019       Component Value Date/Time   CHOL 165 05/18/2019 0845   HDL 68 05/18/2019 0845   LDLCALC 79 05/18/2019 0845    Lab Results  Component Value Date   AST 22 05/08/2019   Lab Results  Component Value Date   ALT 16 05/08/2019   Urinalysis  Component     Latest Ref Rng & Units 03/11/2020  Color, Urine     YELLOW STRAW (A)  Appearance     CLEAR CLEAR  Specific Gravity, Urine     1.005 - 1.030 1.010  pH     5.0 - 8.0 5.5  Glucose, UA     NEGATIVE mg/dL NEGATIVE  Hgb urine dipstick     NEGATIVE NEGATIVE  Bilirubin Urine     NEGATIVE NEGATIVE  Ketones, ur     NEGATIVE mg/dL NEGATIVE  Protein     NEGATIVE mg/dL NEGATIVE  Nitrite     NEGATIVE NEGATIVE  Leukocytes,Ua     NEGATIVE NEGATIVE  Squamous Epithelial / LPF     0 - 5 0-5  WBC, UA     0 - 5 WBC/hpf 0-5  RBC / HPF     0 - 5 RBC/hpf NONE SEEN  Bacteria, UA     NONE SEEN NONE SEEN   I have reviewed the labs.  Assessment & Plan:    1. Genital herpes Continue suppressive therapy with Valtrex 500 mg daily    2. PUNLMP of bladder Recent cystoscopy January 2021 NED Patient has a cystoscopy scheduled for 11/2020   3. Urethral spasms Seem to be attributed to movements and therefore I have encouraged her to be further evaluated through pelvic floor physical therapy She defers at this time stating she will wait for urine culture results and if they are negative  she will consider referral  Return for pending urine culture results .  These notes generated with voice recognition software. I apologize for typographical errors.  Zara Council, PA-C  [facility] [address]  [place],  [address] [phone]

## 2020-03-11 ENCOUNTER — Other Ambulatory Visit: Payer: Self-pay

## 2020-03-11 ENCOUNTER — Ambulatory Visit: Payer: Medicare HMO | Admitting: Urology

## 2020-03-11 ENCOUNTER — Other Ambulatory Visit
Admission: RE | Admit: 2020-03-11 | Discharge: 2020-03-11 | Disposition: A | Discharge status: Home / Self Care | Payer: Medicare HMO | Attending: Urology | Admitting: Urology

## 2020-03-11 ENCOUNTER — Encounter: Payer: Self-pay | Admitting: Urology

## 2020-03-11 VITALS — BP 124/67 | HR 75 | Ht 63.0 in | Wt 125.0 lb

## 2020-03-11 DIAGNOSIS — N390 Urinary tract infection, site not specified: Secondary | ICD-10-CM

## 2020-03-11 LAB — URINALYSIS, COMPLETE (UACMP) WITH MICROSCOPIC
Bacteria, UA: NONE SEEN
Bilirubin Urine: NEGATIVE
Glucose, UA: NEGATIVE mg/dL
Hgb urine dipstick: NEGATIVE
Ketones, ur: NEGATIVE mg/dL
Leukocytes,Ua: NEGATIVE
Nitrite: NEGATIVE
Protein, ur: NEGATIVE mg/dL
RBC / HPF: NONE SEEN RBC/hpf (ref 0–5)
Specific Gravity, Urine: 1.01 (ref 1.005–1.030)
pH: 5.5 (ref 5.0–8.0)

## 2020-03-12 ENCOUNTER — Ambulatory Visit: Payer: Medicare HMO | Admitting: Urology

## 2020-03-13 ENCOUNTER — Telehealth: Payer: Self-pay | Admitting: Family Medicine

## 2020-03-13 LAB — URINE CULTURE: Culture: NO GROWTH

## 2020-03-13 NOTE — Telephone Encounter (Signed)
-----   Message from Nori Riis, PA-C sent at 03/13/2020  7:37 AM EDT ----- Please let Mrs. Amanda Livingston know that her urine culture was negative for infection.

## 2020-03-13 NOTE — Telephone Encounter (Signed)
Patient notified and voiced understanding.

## 2020-04-15 DIAGNOSIS — R002 Palpitations: Secondary | ICD-10-CM | POA: Diagnosis not present

## 2020-04-15 DIAGNOSIS — I10 Essential (primary) hypertension: Secondary | ICD-10-CM | POA: Diagnosis not present

## 2020-04-15 DIAGNOSIS — I491 Atrial premature depolarization: Secondary | ICD-10-CM | POA: Diagnosis not present

## 2020-04-15 DIAGNOSIS — Z905 Acquired absence of kidney: Secondary | ICD-10-CM | POA: Diagnosis not present

## 2020-04-15 DIAGNOSIS — I493 Ventricular premature depolarization: Secondary | ICD-10-CM | POA: Diagnosis not present

## 2020-04-15 DIAGNOSIS — Z23 Encounter for immunization: Secondary | ICD-10-CM | POA: Diagnosis not present

## 2020-04-15 DIAGNOSIS — I340 Nonrheumatic mitral (valve) insufficiency: Secondary | ICD-10-CM | POA: Diagnosis not present

## 2020-04-18 ENCOUNTER — Other Ambulatory Visit: Payer: Self-pay

## 2020-04-18 ENCOUNTER — Encounter: Payer: Self-pay | Admitting: Nurse Practitioner

## 2020-04-18 ENCOUNTER — Ambulatory Visit (INDEPENDENT_AMBULATORY_CARE_PROVIDER_SITE_OTHER): Payer: Medicare HMO | Admitting: Nurse Practitioner

## 2020-04-18 VITALS — BP 135/84 | HR 74 | Temp 97.5°F | Resp 16 | Ht 63.0 in | Wt 130.8 lb

## 2020-04-18 DIAGNOSIS — I10 Essential (primary) hypertension: Secondary | ICD-10-CM

## 2020-04-18 DIAGNOSIS — R300 Dysuria: Secondary | ICD-10-CM

## 2020-04-18 DIAGNOSIS — I8312 Varicose veins of left lower extremity with inflammation: Secondary | ICD-10-CM | POA: Diagnosis not present

## 2020-04-18 DIAGNOSIS — I8311 Varicose veins of right lower extremity with inflammation: Secondary | ICD-10-CM

## 2020-04-18 DIAGNOSIS — Z1231 Encounter for screening mammogram for malignant neoplasm of breast: Secondary | ICD-10-CM | POA: Diagnosis not present

## 2020-04-18 DIAGNOSIS — E2839 Other primary ovarian failure: Secondary | ICD-10-CM | POA: Diagnosis not present

## 2020-04-18 DIAGNOSIS — Z0001 Encounter for general adult medical examination with abnormal findings: Secondary | ICD-10-CM

## 2020-04-18 DIAGNOSIS — I6523 Occlusion and stenosis of bilateral carotid arteries: Secondary | ICD-10-CM | POA: Diagnosis not present

## 2020-04-18 NOTE — Progress Notes (Signed)
[facility] [place], [place] [address]  Internal MEDICINE  Office Visit Note  Patient Name: Amanda Livingston  [id number]  [id number]  Date of Service: 04/18/2020   Pt is here for routine health maintenance examination  Chief Complaint  Patient presents with  . Medicare Wellness  . Hypertension  . controlled substance form    recieved     The patient is here for health maintenance exam.  -has noted her blood pressure more elevated recently. Has seen cardiology. No changes made to her medication. She worries, since she has only one kidney, that elevated blood pressure may damage her remaining kidney.  -she is due to have routine, fasting labs.  -spider veins on legs. Both legs. Does not wear compression stockings. States that she cannot get them on or off. Refuses to wear them. One particular area behind the left knee is swollen and slightly tender.  -carotid doppler done 05/2018 showed mild plaque in both carotid arteries without significant stenosis, bilaterally. She has not had repeat study done since then     Current Medication: Outpatient Encounter Medications as of 04/18/2020  Medication Sig  . Biotin 1000 MCG tablet Take 1,000 mcg by mouth daily.  . Calcium 500 MG CHEW Chew 500 mg by mouth daily.   . Cholecalciferol (VITAMIN D3) 1000 units CAPS Take 1,000 Units by mouth every other day.   . cloNIDine (CATAPRES) 0.1 MG tablet Take 1 tablet (0.1 mg total) by mouth 2 (two) times daily as needed.  Marland Kitchen estradiol (ESTRACE) 0.5 MG tablet Take 1 tablet (0.5 mg total) by mouth daily.  Marland Kitchen levothyroxine (SYNTHROID) 137 MCG tablet Take 137 mcg by mouth daily before breakfast.   . losartan (COZAAR) 25 MG tablet Take 1 tablet (25 mg total) by mouth 2 (two) times daily.  . metoprolol succinate (TOPROL-XL) 25 MG 24 hr tablet Take 12.5 mg by mouth daily.   Marland Kitchen oxybutynin (DITROPAN) 5 MG tablet Take 1 tablet (5 mg total) by mouth 2 (two) times daily as needed for  bladder spasms.  Marland Kitchen tretinoin (RETIN-A) 0.025 % cream Apply 1 application topically daily as needed.  . vitamin C (ASCORBIC ACID) 500 MG tablet Take 500 mg by mouth daily.  . valACYclovir (VALTREX) 500 MG tablet Take 1 tablet (500 mg total) by mouth daily. (Patient not taking: Reported on 04/18/2020)  . [DISCONTINUED] amoxicillin-clavulanate (AUGMENTIN) 875-125 MG tablet Take 1 tablet by mouth every 12 (twelve) hours.   No facility-administered encounter medications on file as of 04/18/2020.    Surgical History: Past Surgical History:  Procedure Laterality Date  . ABDOMINAL HYSTERECTOMY    . colonscopy  2015  . CYSTOSCOPY W/ RETROGRADES Bilateral 10/31/2018   Procedure: CYSTOSCOPY WITH RETROGRADE PYELOGRAM;  Surgeon: Hollice Espy, MD;  Location: [facility] ORS;  Service: Urology;  Laterality: Bilateral;  . CYSTOSCOPY WITH BIOPSY N/A 10/31/2018   Procedure: CYSTOSCOPY WITH Bladder BIOPSY;  Surgeon: Hollice Espy, MD;  Location: [facility] OR[facility];  Service: Urology;  Laterality: N/A;  . ESOPHAGOGASTRODUODENOSCOPY (EGD) WITH PROPOFOL N/A 09/30/2017   Procedure: ESOPHAGOGASTRODUODENOSCOPY (EGD) WITH PROPOFOL;  Surgeon: Virgel Manifold, MD;  Location: [facility] ENDOSCOPY;  Service: Endoscopy;  Laterality: N/A;  . NEPHRECTOMY Left   . THYROIDECTOMY    . TONSILLECTOMY Right     Medical History: Past Medical History:  Diagnosis Date  . Arthritis   . Complication of anesthesia   . Dysrhythmia   . Family history of adverse reaction to anesthesia    PONV  .  Hypertension   . Hypothyroidism   . Osteopenia   . PONV (postoperative nausea and vomiting)   . 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[place])    thyroid    Family History: Family History  Problem Relation Age of Onset  . Cancer Father       Review of Systems  Constitutional: Negative for activity change, chills, fatigue and unexpected weight change.  HENT: Negative for congestion, postnasal drip, rhinorrhea, sneezing and sore throat.   Respiratory: Negative for  cough, chest tightness, shortness of breath and wheezing.   Cardiovascular: Negative for chest pain and palpitations.  Gastrointestinal: Negative for abdominal pain, constipation, diarrhea, nausea and vomiting.  Endocrine: Negative for cold intolerance, heat intolerance, polydipsia and polyuria.       Hypothyroid managed per endocrinology  Genitourinary: Negative for dysuria and frequency.  Musculoskeletal: Negative for arthralgias, back pain, joint swelling and neck pain.  Skin: Negative for rash.  Allergic/Immunologic: Negative for environmental allergies.  Neurological: Negative for dizziness, tremors, numbness and headaches.  Hematological: Negative for adenopathy. Does not bruise/bleed easily.  Psychiatric/Behavioral: Negative for behavioral problems (Depression), sleep disturbance and suicidal ideas. The patient is not nervous/anxious.      Today's Vitals   04/18/20 1058  BP: 135/84  Pulse: 74  Resp: 16  Temp: (!) 97.5 F (36.4 C)  SpO2: 98%  Weight: 130 lb 12.8 oz (59.3 kg)  Height: 5\' 3"  (1.6 m)   Body mass index is 23.17 kg/m.  Physical Exam Vitals and nursing note reviewed.  Constitutional:      General: She is not in acute distress.    Appearance: Normal appearance. She is well-developed and well-nourished. She is not diaphoretic.  HENT:     Head: Normocephalic and atraumatic.     Nose: Nose normal.     Mouth/Throat:     Mouth: Oropharynx is clear and moist.     Pharynx: No oropharyngeal exudate.  Eyes:     Extraocular Movements: EOM normal.     Pupils: Pupils are equal, round, and reactive to light.  Neck:     Thyroid: No thyromegaly.     Vascular: No carotid bruit or JVD.     Trachea: No tracheal deviation.  Cardiovascular:     Rate and Rhythm: Normal rate. Rhythm irregular.     Pulses: Normal pulses.     Heart sounds: Normal heart sounds. No murmur heard. No friction rub. No gallop.      Comments: Varicose veins present on bilateral lower  extremities Pulmonary:     Effort: Pulmonary effort is normal. No respiratory distress.     Breath sounds: Normal breath sounds. No wheezing or rales.  Chest:     Chest wall: No tenderness.  Abdominal:     General: Bowel sounds are normal.     Palpations: Abdomen is soft.     Tenderness: There is no abdominal tenderness.  Musculoskeletal:        General: Normal range of motion.     Cervical back: Normal range of motion and neck supple.  Lymphadenopathy:     Cervical: No cervical adenopathy.  Skin:    General: Skin is warm and dry.     Capillary Refill: Capillary refill takes less than 2 seconds.  Neurological:     General: No focal deficit present.     Mental Status: She is alert and oriented to person, place, and time.     Cranial Nerves: No cranial nerve deficit.  Psychiatric:        Mood  and Affect: Mood and affect and mood normal.        Behavior: Behavior normal.        Thought Content: Thought content normal.        Judgment: Judgment normal.    Depression screen [facility] 2/9 04/18/2020 12/28/2019 08/31/2019 04/18/2019 10/24/2018  Decreased Interest 0 0 0 0 0  Down, Depressed, Hopeless 0 0 0 0 0  PHQ - 2 Score 0 0 0 0 0  Altered sleeping - - - - -  Tired, decreased energy - - - - -  Change in appetite - - - - -  Feeling bad or failure about yourself  - - - - -  Trouble concentrating - - - - -  Moving slowly or fidgety/restless - - - - -  Suicidal thoughts - - - - -  PHQ-9 Score - - - - -  Difficult doing work/chores - - - - -    Functional Status Survey: Is the patient deaf or have difficulty hearing?: No Does the patient have difficulty seeing, even when wearing glasses/contacts?: No Does the patient have difficulty concentrating, remembering, or making decisions?: Yes Does the patient have difficulty walking or climbing stairs?: No Does the patient have difficulty dressing or bathing?: No Does the patient have difficulty doing errands alone such as visiting a doctor's  office or shopping?: No  MMSE - [place] Exam 04/18/2020 04/18/2019 04/12/2018  Orientation to time 5 5 5   Orientation to Place 5 5 5   Registration 3 3 3   Attention/ Calculation 5 5 5   Recall 3 3 3   Language- name 2 objects 2 2 2   Language- repeat 1 1 1   Language- follow 3 step command 3 3 3   Language- read & follow direction 1 1 1   Write a sentence 1 1 1   Copy design 1 1 1   Total score 30 30 30     Fall Risk  04/18/2020 12/28/2019 08/31/2019 04/18/2019 10/24/2018  Falls in the past year? 0 0 0 0 0  Number falls in past yr: - - - - -  Injury with Fall? - - - - -     LABS: Recent Results (from the past 2160 hour(s))  Urinalysis, Complete     Status: Abnormal   Collection Time: 02/22/20  3:02 PM  Result Value Ref Range   Specific Gravity, UA 1.010 1.005 - 1.030   pH, UA 5.5 5.0 - 7.5   Color, UA Straw Yellow   Appearance Ur Hazy (A) Clear   Leukocytes,UA Negative Negative   Protein,UA Negative Negative/Trace   Glucose, UA Negative Negative   Ketones, UA Negative Negative   RBC, UA Negative Negative   Bilirubin, UA Negative Negative   Urobilinogen, Ur 0.2 0.2 - 1.0 mg/dL   Nitrite, UA Negative Negative   Microscopic Examination See below:   CULTURE, URINE COMPREHENSIVE     Status: Abnormal   Collection Time: 02/22/20  3:02 PM   Specimen: Urine   UR  Result Value Ref Range   Urine Culture, Comprehensive Final report (A)    Organism ID, Bacteria Comment (A)     Comment: Streptococcus mitis/oralis group 50,000-100,000 colony forming units per mL Susceptibility not normally performed on this organism.   Microscopic Examination     Status: Abnormal   Collection Time: 02/22/20  3:02 PM   Urine  Result Value Ref Range   WBC, UA 0-5 0 - 5 /hpf   RBC 0-2 0 - 2 /hpf  Epithelial Cells (non renal) 0-10 0 - 10 /hpf   Bacteria, UA Moderate (A) None seen/Few  Urinalysis, Complete w Microscopic     Status: Abnormal   Collection Time: 03/11/20  1:16 PM  Result Value Ref  Range   Color, Urine STRAW (A) YELLOW   APPearance CLEAR CLEAR   Specific Gravity, Urine 1.010 1.005 - 1.030   pH 5.5 5.0 - 8.0   Glucose, UA NEGATIVE NEGATIVE mg/dL   Hgb urine dipstick NEGATIVE NEGATIVE   Bilirubin Urine NEGATIVE NEGATIVE   Ketones, ur NEGATIVE NEGATIVE mg/dL   Protein, ur NEGATIVE NEGATIVE mg/dL   Nitrite NEGATIVE NEGATIVE   Leukocytes,Ua NEGATIVE NEGATIVE   Squamous Epithelial / LPF 0-5 0 - 5   WBC, UA 0-5 0 - 5 WBC/hpf   RBC / HPF NONE SEEN 0 - 5 RBC/hpf   Bacteria, UA NONE SEEN NONE SEEN    Comment: Performed at [facility], [address], [place], [place] [address]  Urine Culture     Status: None   Collection Time: 03/11/20  1:16 PM   Specimen: Urine, Clean Catch  Result Value Ref Range   Specimen Description      URINE, CLEAN CATCH Performed at [facility], [address], [place], [place] [address]    Special Requests      NONE Performed at [facility], [address], [place],  [place] [address]    Culture      NO GROWTH Performed at [facility], [place] [address], [place],  [address]    Report Status 03/13/2020 FINAL     Assessment/Plan: 1. Encounter for general adult medical examination with abnormal findings Annual health maintenance exam today. Order for routine, fasting labs given today.   2. Occlusion and stenosis of bilateral carotid arteries Reviewed carotid doppler 05/2018 showing mild plaque with <50% stenosis, bilaterally. Will get new carotid doppler for surveillance.  - US Carotid Bilateral; Future  3. Varicose veins of both lower extremities with inflammation Encouraged the patient to wear compression socks every day. Will get venous ultrasound of the lower extremities and refer to vein and vascular as indicated.  - US Venous Img Lower Bilateral; Future  4. Essential hypertension, benign Stable. Continue medications and routine follow ups with cardiology as scheduled   5.  Ovarian failure - DG Bone Density; Future  6. Encounter for screening mammogram for malignant neoplasm of breast - MM DIGITAL SCREENING BILATERAL; Future  7. Dysuria - UA/M w/rflx Culture, Routine  General Counseling: casey maxfield understanding of the findings of todays visit and agrees with plan of treatment. I have discussed any further diagnostic evaluation that may be needed or ordered today. We also reviewed her medications today. she has been encouraged to call the office with any questions or concerns that should arise related to todays visit.    Counseling:  This patient was seen by Leretha Pol FNP Collaboration with Dr Lavera Guise as a part of collaborative care agreement  Orders Placed This Encounter  Procedures  . US Carotid Bilateral  . US Venous Img Lower Bilateral  . DG Bone Density  . MM DIGITAL SCREENING BILATERAL  . UA/M w/rflx Culture, Routine    Total time spent: 52 Minutes  Time spent includes review of chart, medications, test results, and follow up plan with the patient.     Lavera Guise, MD  Internal Medicine

## 2020-04-19 LAB — UA/M W/RFLX CULTURE, ROUTINE
Bilirubin, UA: NEGATIVE
Glucose, UA: NEGATIVE
Ketones, UA: NEGATIVE
Leukocytes,UA: NEGATIVE
Nitrite, UA: NEGATIVE
Protein,UA: NEGATIVE
RBC, UA: NEGATIVE
Specific Gravity, UA: 1.01 (ref 1.005–1.030)
Urobilinogen, Ur: 0.2 mg/dL (ref 0.2–1.0)
pH, UA: 6.5 (ref 5.0–7.5)

## 2020-04-19 LAB — MICROSCOPIC EXAMINATION
Bacteria, UA: NONE SEEN
Casts: NONE SEEN /lpf
RBC, Urine: NONE SEEN /hpf (ref 0–2)
WBC, UA: NONE SEEN /hpf (ref 0–5)

## 2020-05-15 ENCOUNTER — Other Ambulatory Visit: Payer: Medicare HMO

## 2020-05-15 DIAGNOSIS — I491 Atrial premature depolarization: Secondary | ICD-10-CM | POA: Diagnosis not present

## 2020-05-15 DIAGNOSIS — R002 Palpitations: Secondary | ICD-10-CM | POA: Diagnosis not present

## 2020-05-15 DIAGNOSIS — I340 Nonrheumatic mitral (valve) insufficiency: Secondary | ICD-10-CM | POA: Diagnosis not present

## 2020-05-15 DIAGNOSIS — I10 Essential (primary) hypertension: Secondary | ICD-10-CM | POA: Diagnosis not present

## 2020-05-15 DIAGNOSIS — I493 Ventricular premature depolarization: Secondary | ICD-10-CM | POA: Diagnosis not present

## 2020-05-21 ENCOUNTER — Telehealth: Payer: Self-pay

## 2020-05-21 NOTE — Telephone Encounter (Signed)
PA approved for Tretinoin 0.025% cream on 05/20/2020

## 2020-05-26 IMAGING — US US RENAL
1 series · 14 of 24 positions shown · non-contrast
Comparison: Report of urinary tract ultrasound 10/25/2014 from [REDACTED] which was interpreted as a normal examination.

CLINICAL DATA: 73-year-old presenting with recurrent urinary tract
infections. Surgical history includes LEFT nephrectomy.

EXAM:
RENAL / URINARY TRACT ULTRASOUND COMPLETE

[Series 1: us renal · 0.22mm/px · 14 of 24 slices shown]
[im 1/24]
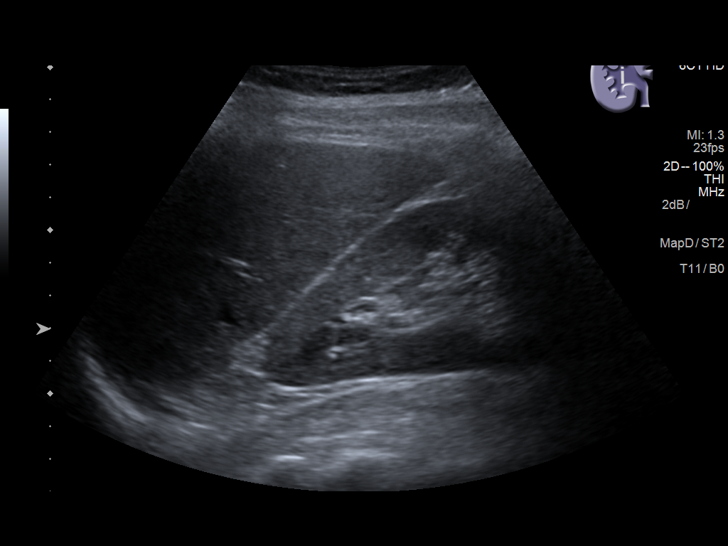
[im 3/24]
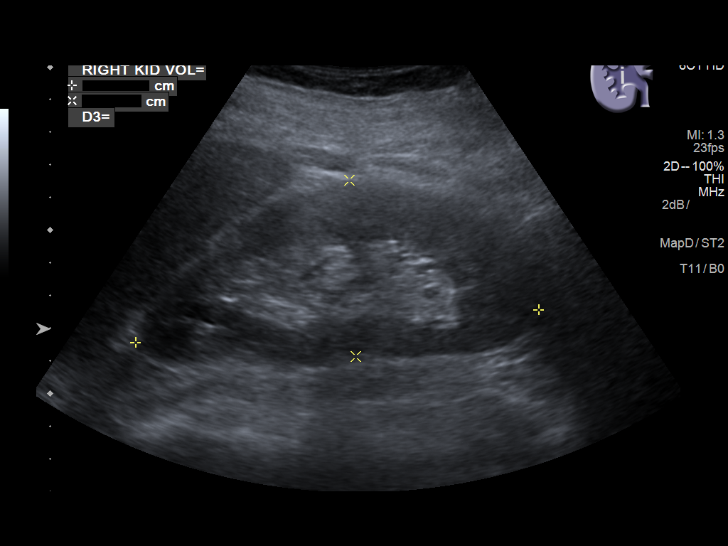
[im 5/24]
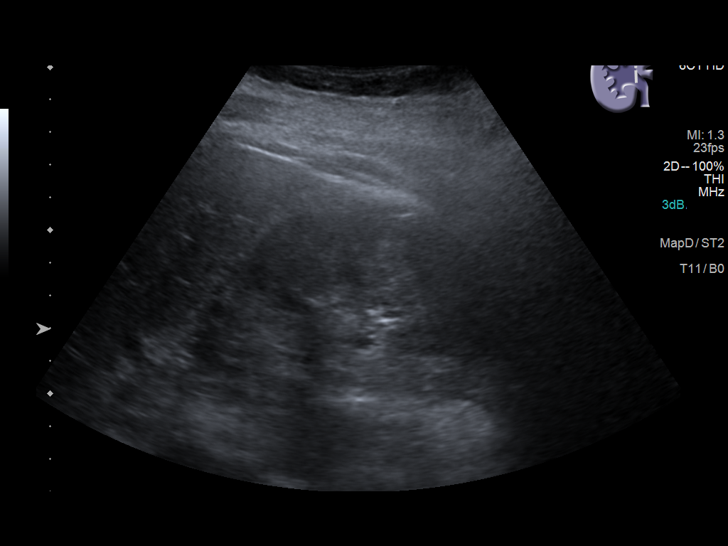
[im 7/24]
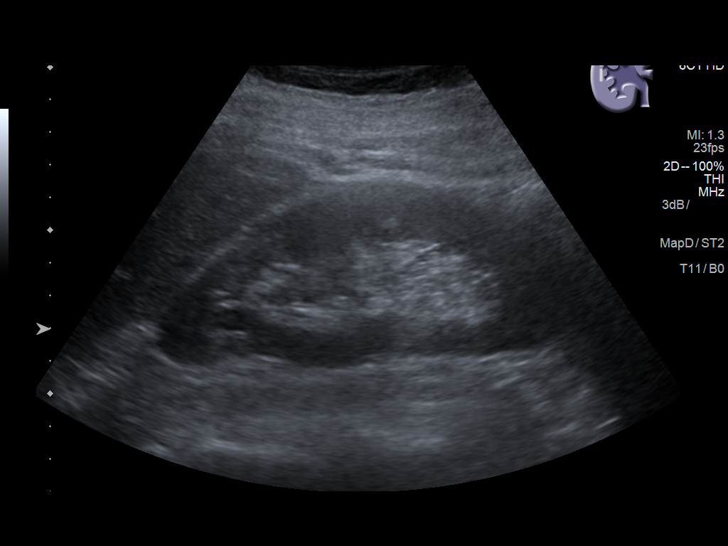
[im 8/24]
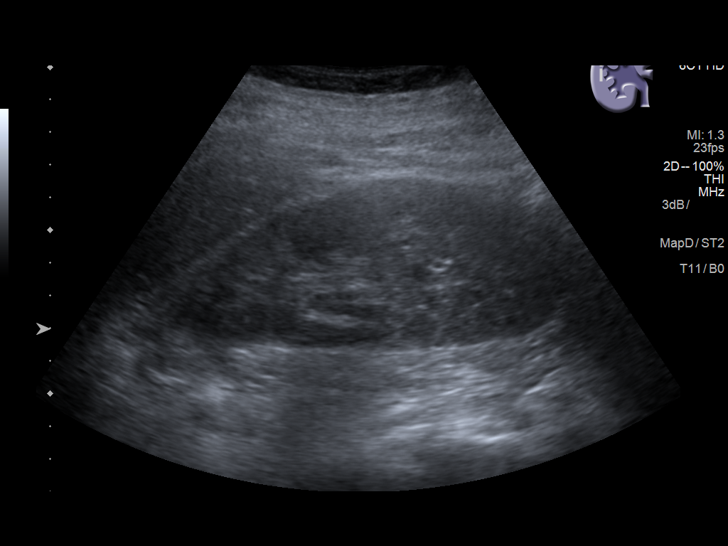
[im 10/24]
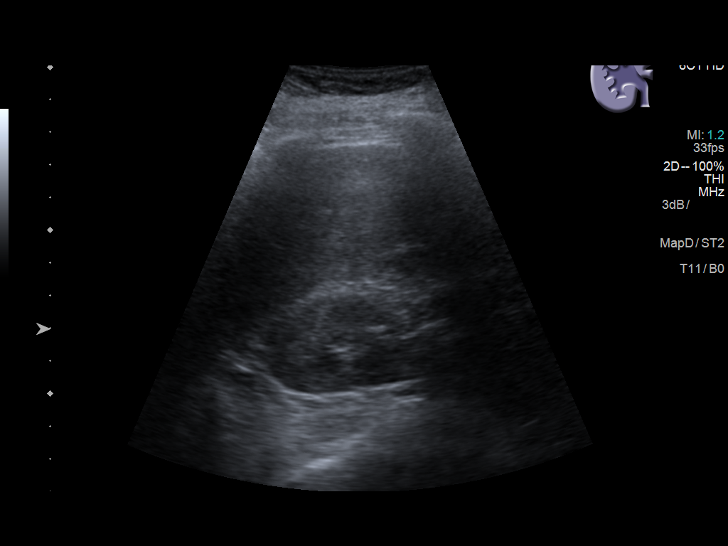
[im 12/24]
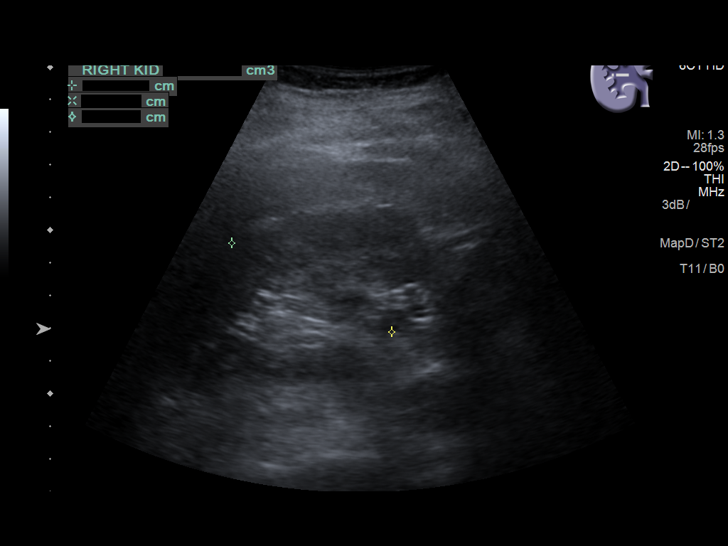
[im 13/24]
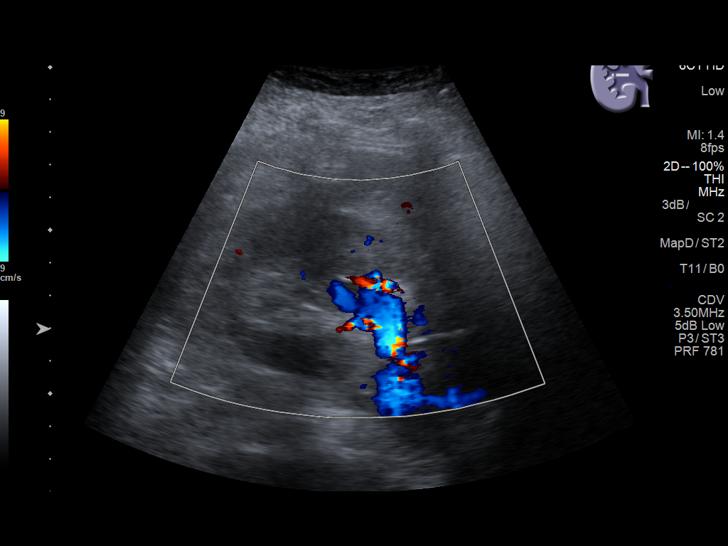
[im 15/24]
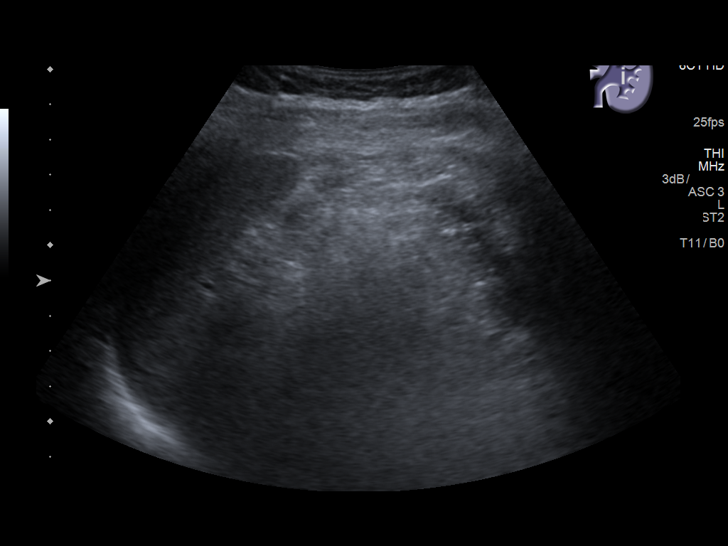
[im 17/24]
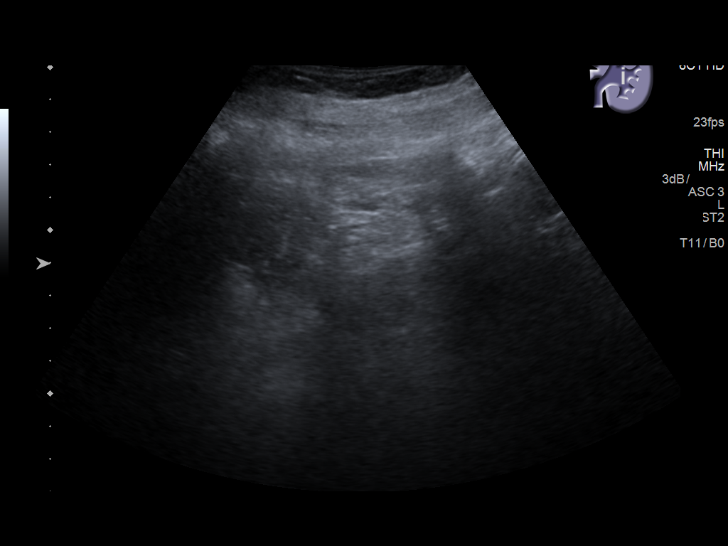
[im 19/24]
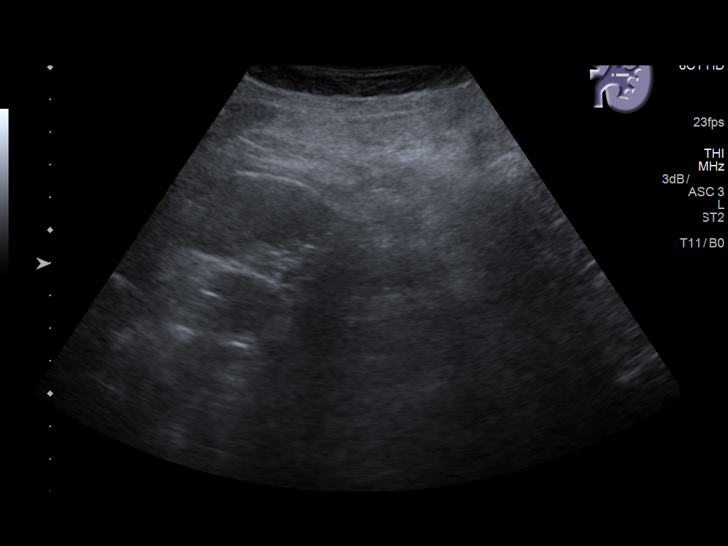
[im 20/24]
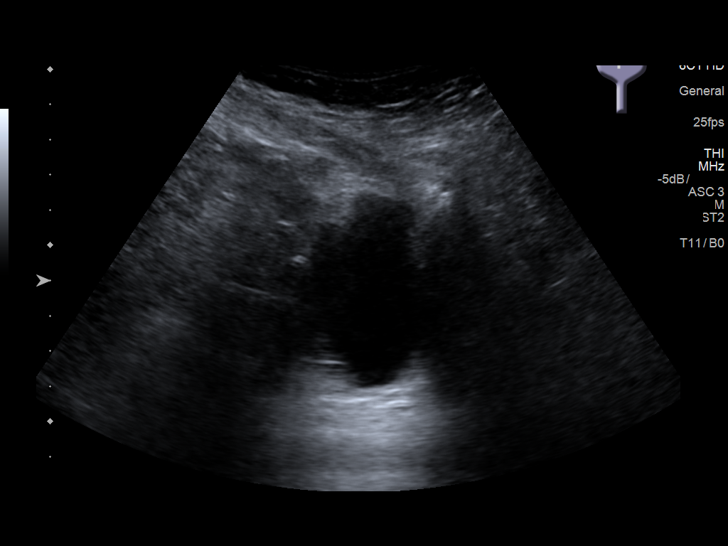
[im 22/24]
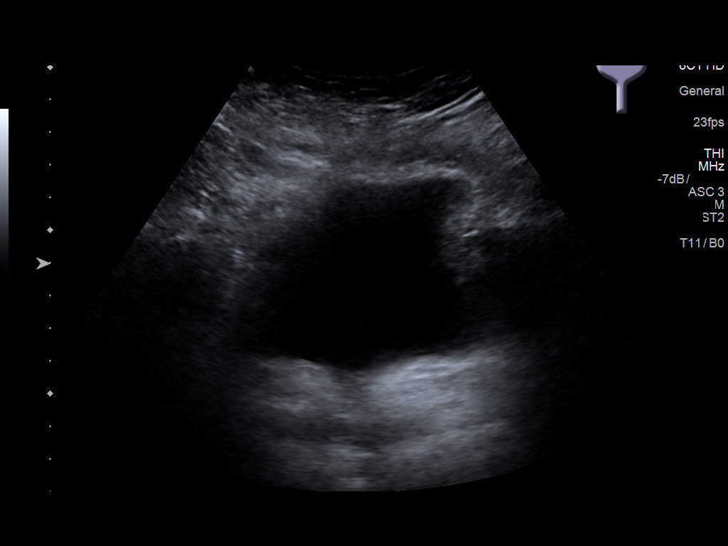
[im 24/24]
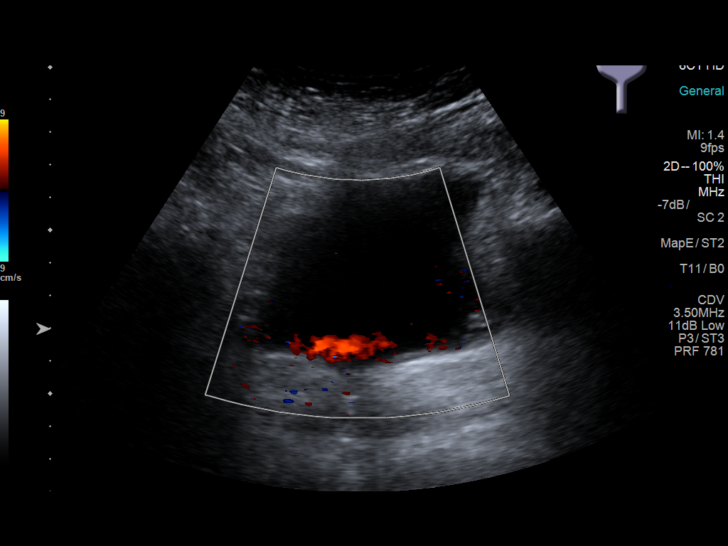

[14 of 24 positions shown; findings below may reference images not displayed]

FINDINGS: Right Kidney:

Renal measurements: Approximately 12.4 x 5.4 x 5.6 cm = volume: 196
mL . No hydronephrosis. Well-preserved cortex. No shadowing calculi.
Normal parenchymal echotexture. No focal parenchymal abnormality.

Left Kidney:

Surgically absent.

Bladder:

Normal for degree of bladder distention. RIGHT ureteral jet visible
at Doppler evaluation.
IMPRESSION: Normal examination post LEFT nephrectomy.

## 2020-06-05 ENCOUNTER — Other Ambulatory Visit: Payer: Medicare HMO

## 2020-06-06 DIAGNOSIS — I10 Essential (primary) hypertension: Secondary | ICD-10-CM | POA: Diagnosis not present

## 2020-06-06 DIAGNOSIS — I493 Ventricular premature depolarization: Secondary | ICD-10-CM | POA: Diagnosis not present

## 2020-06-06 DIAGNOSIS — I519 Heart disease, unspecified: Secondary | ICD-10-CM | POA: Diagnosis not present

## 2020-06-06 DIAGNOSIS — I491 Atrial premature depolarization: Secondary | ICD-10-CM | POA: Diagnosis not present

## 2020-06-06 DIAGNOSIS — R002 Palpitations: Secondary | ICD-10-CM | POA: Diagnosis not present

## 2020-06-10 ENCOUNTER — Other Ambulatory Visit: Payer: Self-pay | Admitting: Nurse Practitioner

## 2020-06-10 DIAGNOSIS — E559 Vitamin D deficiency, unspecified: Secondary | ICD-10-CM | POA: Diagnosis not present

## 2020-06-10 DIAGNOSIS — Z0001 Encounter for general adult medical examination with abnormal findings: Secondary | ICD-10-CM | POA: Diagnosis not present

## 2020-06-10 DIAGNOSIS — I10 Essential (primary) hypertension: Secondary | ICD-10-CM | POA: Diagnosis not present

## 2020-06-11 LAB — COMPREHENSIVE METABOLIC PANEL
ALT: 18 IU/L (ref 0–32)
AST: 24 IU/L (ref 0–40)
Albumin/Globulin Ratio: 1.6 (ref 1.2–2.2)
Albumin: 4.1 g/dL (ref 3.7–4.7)
Alkaline Phosphatase: 71 IU/L (ref 44–121)
BUN/Creatinine Ratio: 21 (ref 12–28)
BUN: 15 mg/dL (ref 8–27)
Bilirubin Total: 0.3 mg/dL (ref 0.0–1.2)
CO2: 25 mmol/L (ref 20–29)
Calcium: 9.3 mg/dL (ref 8.7–10.3)
Chloride: 98 mmol/L (ref 96–106)
Creatinine, Ser: 0.72 mg/dL (ref 0.57–1.00)
GFR calc Af Amer: 94 mL/min/{1.73_m2} (ref >59)
GFR calc non Af Amer: 82 mL/min/{1.73_m2} (ref >59)
Globulin, Total: 2.5 g/dL (ref 1.5–4.5)
Glucose: 89 mg/dL (ref 65–99)
Potassium: 4.6 mmol/L (ref 3.5–5.2)
Sodium: 136 mmol/L (ref 134–144)
Total Protein: 6.6 g/dL (ref 6.0–8.5)

## 2020-06-11 LAB — CBC
Hematocrit: 37.4 % (ref 34.0–46.6)
Hemoglobin: 12.8 g/dL (ref 11.1–15.9)
MCH: 29.6 pg (ref 26.6–33.0)
MCHC: 34.2 g/dL (ref 31.5–35.7)
MCV: 87 fL (ref 79–97)
Platelets: 206 10*3/uL (ref 150–450)
RBC: 4.32 x10E6/uL (ref 3.77–5.28)
RDW: 11.7 % (ref 11.7–15.4)
WBC: 5.3 10*3/uL (ref 3.4–10.8)

## 2020-06-11 LAB — LIPID PANEL WITH LDL/HDL RATIO
Cholesterol, Total: 181 mg/dL (ref 100–199)
HDL: 73 mg/dL (ref >39)
LDL Chol Calc (NIH): 90 mg/dL (ref 0–99)
LDL/HDL Ratio: 1.2 ratio (ref 0.0–3.2)
Triglycerides: 99 mg/dL (ref 0–149)
VLDL Cholesterol Cal: 18 mg/dL (ref 5–40)

## 2020-06-11 LAB — VITAMIN D 25 HYDROXY (VIT D DEFICIENCY, FRACTURES): Vit D, 25-Hydroxy: 67.8 ng/mL (ref 30.0–100.0)

## 2020-06-19 ENCOUNTER — Ambulatory Visit: Payer: Medicare HMO | Admitting: Hospice and Palliative Medicine

## 2020-06-25 ENCOUNTER — Telehealth: Payer: Self-pay

## 2020-06-25 NOTE — Telephone Encounter (Signed)
Mailed patient records to [facility] on 06/25/2020 at [address] [place] [address] 

## 2020-06-28 DIAGNOSIS — H2513 Age-related nuclear cataract, bilateral: Secondary | ICD-10-CM | POA: Diagnosis not present

## 2020-07-17 DIAGNOSIS — N951 Menopausal and female climacteric states: Secondary | ICD-10-CM | POA: Diagnosis not present

## 2020-07-17 DIAGNOSIS — E2839 Other primary ovarian failure: Secondary | ICD-10-CM | POA: Diagnosis not present

## 2020-07-17 DIAGNOSIS — Z13820 Encounter for screening for osteoporosis: Secondary | ICD-10-CM | POA: Diagnosis not present

## 2020-07-17 DIAGNOSIS — Z1231 Encounter for screening mammogram for malignant neoplasm of breast: Secondary | ICD-10-CM | POA: Diagnosis not present

## 2020-07-23 DIAGNOSIS — R928 Other abnormal and inconclusive findings on diagnostic imaging of breast: Secondary | ICD-10-CM | POA: Diagnosis not present

## 2020-07-30 DIAGNOSIS — Z79899 Other long term (current) drug therapy: Secondary | ICD-10-CM | POA: Diagnosis not present

## 2020-07-30 DIAGNOSIS — Z85850 Personal history of malignant neoplasm of thyroid: Secondary | ICD-10-CM | POA: Diagnosis not present

## 2020-07-30 DIAGNOSIS — E890 Postprocedural hypothyroidism: Secondary | ICD-10-CM | POA: Diagnosis not present

## 2020-07-31 ENCOUNTER — Telehealth: Payer: Self-pay | Admitting: Internal Medicine

## 2020-07-31 NOTE — Progress Notes (Signed)
  Chronic Care Management   Note  07/31/2020 Name: GIANI WINTHER MRN: [id number] DOB: 1946/05/11  LANISHA STEPANIAN is a 75 y.o. year old female who is a primary care patient of Lavera Guise, MD. I reached out to Earline Mayotte by phone today in response to a referral sent by Ms. Esmond Harps PCP, Lavera Guise, MD.   Ms. Diefendorf was given information about Chronic Care Management services today including:  1. CCM service includes personalized support from designated clinical staff supervised by her physician, including individualized plan of care and coordination with other care providers 2. 24/7 contact phone numbers for assistance for urgent and routine care needs. 3. Service will only be billed when office clinical staff spend 20 minutes or more in a month to coordinate care. 4. Only one practitioner may furnish and bill the service in a calendar month. 5. The patient may stop CCM services at any time (effective at the end of the month) by phone call to the office staff.   Patient agreed to services and verbal consent obtained.   Follow up plan:   Carley Perdue [employer] Scheduler

## 2020-08-06 ENCOUNTER — Telehealth: Payer: Self-pay

## 2020-08-06 NOTE — Telephone Encounter (Signed)
Contacted [facility] in regards to bone density and mammogram order and patient was seen on 07/17/20. Copy of order placed in scan. Amanda Livingston

## 2020-08-15 NOTE — Progress Notes (Signed)
Chronic Care Management Pharmacy Note  08/21/2020 Name:  Amanda Livingston MRN:  [id number] DOB:  March 14, 1946  Subjective: Amanda Livingston is an 75 y.o. year old female who is a primary patient of Humphrey Rolls Timoteo Gaul, MD.  The CCM team was consulted for assistance with disease management and care coordination needs.    Engaged with patient by telephone for follow up visit in response to provider referral for pharmacy case management and/or care coordination services.   Consent to Services:  The patient was given the following information about Chronic Care Management services today, agreed to services, and gave verbal consent: 1. CCM service includes personalized support from designated clinical staff supervised by the primary care provider, including individualized plan of care and coordination with other care providers 2. 24/7 contact phone numbers for assistance for urgent and routine care needs. 3. Service will only be billed when office clinical staff spend 20 minutes or more in a month to coordinate care. 4. Only one practitioner may furnish and bill the service in a calendar month. 5.The patient may stop CCM services at any time (effective at the end of the month) by phone call to the office staff. 6. The patient will be responsible for cost sharing (co-pay) of up to 20% of the service fee (after annual deductible is met). Patient agreed to services and consent obtained.  Patient Care Team: Lavera Guise, MD as PCP - General (Internal Medicine) Edythe Clarity, [facility] as Pharmacist (Pharmacist)  Recent office visits: 12/28/2019 Junius Creamer) - STARTED oxybutynin 58m BID for urinary frequency.  No other medication changes noted at this visit.  Recent consult visits: 06/06/20 ([facility], Cardio) - no changes to medication, patient instructed to keep a BP diary.  [place] [facility] None in previous 6  months  Objective:  Lab Results  Component Value Date   CREATININE 0.72 06/10/2020   BUN 15 06/10/2020   GFRNONAA 82 06/10/2020   GFRAA 94 06/10/2020   NA 136 06/10/2020   K 4.6 06/10/2020   CALCIUM 9.3 06/10/2020   CO2 25 06/10/2020   GLUCOSE 89 06/10/2020    No results found for: HGBA1C, FRUCTOSAMINE, GFR, MICROALBUR  Last diabetic Eye exam: No results found for: HMDIABEYEEXA  Last diabetic Foot exam: No results found for: HMDIABFOOTEX   Lab Results  Component Value Date   CHOL 181 06/10/2020   HDL 73 06/10/2020   LDLCALC 90 06/10/2020   TRIG 99 06/10/2020    Hepatic Function Latest Ref Rng & Units 06/10/2020 05/08/2019 04/28/2018  Total Protein 6.0 - 8.5 g/dL 6.6 6.1 6.7  Albumin 3.7 - 4.7 g/dL 4.1 4.1 4.5  AST 0 - 40 IU/L _0 ALT 0 - 32 IU/L _1 Alk Phosphatase 44 - 121 IU/L 71 73 59  Total Bilirubin 0.0 - 1.2 mg/dL 0.3 0.4 0.4    Lab Results  Component Value Date/Time   TSH 0.690 09/14/2019 02:40 PM   TSH 0.943 05/18/2019 08:45 AM   FREET4 1.87 (H) 09/14/2019 02:40 PM   FREET4 2.11 (H) 05/18/2019 08:45 AM    CBC Latest Ref Rng & Units 06/10/2020 05/18/2019 10/06/2018  WBC 3.4 - 10.8 x10E3/uL 5.3 5.2 5.9  Hemoglobin 11.1 - 15.9 g/dL 12.8 13.2 12.7  Hematocrit 34.0 - 46.6 % 37.4 39.4 38.6  Platelets 150 - 450 x10E3/uL 206 232 217    Lab Results  Component Value Date/Time   VD25OH 67.8  06/10/2020 08:57 AM    Clinical ASCVD: No  The 10-year ASCVD risk score Mikey Bussing DC Jr., et al., 2013) is: 40.2%   Values used to calculate the score:     Age: 58 years     Sex: Female     Is Non-Hispanic African American: No     Diabetic: No     Tobacco smoker: Yes     Systolic Blood Pressure: 094 mmHg     Is BP treated: Yes     HDL Cholesterol: 73 mg/dL     Total Cholesterol: 181 mg/dL    Depression screen [facility] 2/9 04/18/2020 12/28/2019 08/31/2019  Decreased Interest 0 0 0  Down, Depressed, Hopeless 0 0 0  PHQ - 2 Score 0 0 0  Altered sleeping - - -   Tired, decreased energy - - -  Change in appetite - - -  Feeling bad or failure about yourself  - - -  Trouble concentrating - - -  Moving slowly or fidgety/restless - - -  Suicidal thoughts - - -  PHQ-9 Score - - -  Difficult doing work/chores - - -     Social History   Tobacco Use  Smoking Status Current Some Day Smoker  . Packs/day: 0.25  . Types: Cigarettes  Smokeless Tobacco Never Used  Tobacco Comment   3 or 4 cigarettes a day due to stress, trying to quit   BP Readings from Last 3 Encounters:  04/18/20 135/84  03/11/20 124/67  02/22/20 137/84   Pulse Readings from Last 3 Encounters:  04/18/20 74  03/11/20 75  02/22/20 80   Wt Readings from Last 3 Encounters:  04/18/20 130 lb 12.8 oz (59.3 kg)  03/11/20 125 lb (56.7 kg)  02/22/20 125 lb (56.7 kg)   BMI Readings from Last 3 Encounters:  04/18/20 23.17 kg/m  03/11/20 22.14 kg/m  02/22/20 21.80 kg/m    Assessment/Interventions: Review of patient past medical history, allergies, medications, health status, including review of consultants reports, laboratory and other test data, was performed as part of comprehensive evaluation and provision of chronic care management services.   SDOH:  (Social Determinants of Health) assessments and interventions performed: Yes  SDOH Screenings   Alcohol Screen: Low Risk   . Last Alcohol Screening Score (AUDIT): 4  Depression (PHQ2-9): Low Risk   . PHQ-2 Score: 0  Financial Resource Strain: Not on file  Food Insecurity: Not on file  Housing: Not on file  Physical Activity: Not on file  Social Connections: Not on file  Stress: Not on file  Tobacco Use: High Risk  . Smoking Tobacco Use: Current Some Day Smoker  . Smokeless Tobacco Use: Never Used  Transportation Needs: Not on file    CCM Care Plan  Allergies  Allergen Reactions  . Nitrofurantoin Rash    Medications Reviewed Today    Reviewed by Edythe Clarity, [facility] (Pharmacist) on 08/21/20 at 1411  Med  List Status: <None>  Medication Order Taking? Sig Documenting Provider Last Dose Status Informant  Biotin 1000 MCG tablet [id number] Yes Take 1,000 mcg by mouth daily. [provider] Taking Active Self  Calcium 500 MG CHEW [id number] Yes Chew 500 mg by mouth daily.  [provider] Taking Active Self  Cholecalciferol (VITAMIN D3) 1000 units CAPS [id number] Yes Take 1,000 Units by mouth every other day.  [provider] Taking Active Self  cloNIDine (CATAPRES) 0.1 MG tablet [id number] Yes Take 1 tablet (0.1 mg total) by mouth 2 (two) times  daily as needed. Daymon Larsen, MD Taking Active Self  estradiol (ESTRACE) 0.5 MG tablet [id number] Yes Take 1 tablet (0.5 mg total) by mouth daily. Ronnell Freshwater, NP Taking Active   levothyroxine (SYNTHROID) 137 MCG tablet [id number] Yes Take 137 mcg by mouth daily before breakfast.  [provider] Taking Active Self  losartan (COZAAR) 25 MG tablet [id number] Yes Take 1 tablet (25 mg total) by mouth 2 (two) times daily. Ronnell Freshwater, NP Taking Active   metoprolol succinate (TOPROL-XL) 25 MG 24 hr tablet [id number] Yes Take 12.5 mg by mouth daily.  [provider] Taking Active Self           Med Note Benancio Deeds Oct 24, 2018  2:02 PM)    oxybutynin (DITROPAN) 5 MG tablet [id number] Yes Take 1 tablet (5 mg total) by mouth 2 (two) times daily as needed for bladder spasms. Ronnell Freshwater, NP Taking Active   tretinoin (RETIN-A) 0.025 % cream [id number] Yes Apply 1 application topically daily as needed. Ronnell Freshwater, NP Taking Active   valACYclovir (VALTREX) 500 MG tablet [id number] Yes Take 1 tablet (500 mg total) by mouth daily. Nori Riis, PA-C Taking Active   vitamin C (ASCORBIC ACID) 500 MG tablet [id number] Yes Take 500 mg by mouth daily. [provider] Taking Active Self          Patient Active Problem List   Diagnosis Date Noted  . Encounter for general adult medical  examination with abnormal findings 04/18/2020  . Varicose veins of both lower extremities with inflammation 04/18/2020  . Ovarian failure 04/18/2020  . Encounter for screening mammogram for malignant neoplasm of breast 04/18/2020  . Occlusion and stenosis of bilateral carotid arteries 01/14/2020  . Lipoma of skin of abdomen 09/24/2019  . Hypothyroidism, postop 07/11/2019  . Other long term (current) drug therapy 07/11/2019  . Personal history of malignant neoplasm of thyroid 07/11/2019  . Atypical chest pain 02/22/2019  . Non-intractable vomiting 10/24/2018  . Unspecified menopausal and perimenopausal disorder 10/24/2018  . Tobacco use 09/28/2018  . Urinary tract infection with hematuria 05/25/2018  . Dysuria 05/25/2018  . Vaginal itching 04/28/2018  . Bacterial vaginitis 04/28/2018  . Vaginal yeast infection 04/28/2018  . Nonrheumatic mitral valve regurgitation 03/07/2018  . Premature atrial contractions 12/02/2017  . Non-ulcerative nasal mucositis 10/18/2017  . Hyperpigmentation 10/18/2017  . Reflux esophagitis   . Thickening of esophagus   . Stomach irritation   . Hiatal hernia   . Heartburn   . Hypothyroidism 06/14/2017  . Nicotine dependence, cigarettes, uncomplicated 85/63/1497  . Palpitations 04/26/2017  . Hyperlipidemia 04/26/2017  . Incomplete emptying of bladder 10/06/2016  . Premature ventricular contractions 07/24/2016  . Left ventricular diastolic dysfunction 02/63/7858  . Essential hypertension, benign 02/21/2016  . Acquired absence of kidney 10/05/2014  . Chronic cystitis 10/05/2014  . Increased frequency of urination 10/05/2014    Immunization History  Administered Date(s) Administered  . Fluad Quad(high Dose 65+) 02/13/2020  . Influenza Inj Mdck Quad Pf 02/20/2019  . Influenza, High Dose Seasonal PF 02/14/2014, 02/13/2017, 02/07/2018, 02/20/2019  . Influenza-Unspecified 02/13/2017  . PFIZER(Purple Top)SARS-COV-2 Vaccination 05/22/2019, 06/12/2019     Conditions to be addressed/monitored:  HTN, Hypothyroidism, HLD, Tobacco use, PVC  Care Plan : General Pharmacy (Adult)  Updates made by Edythe Clarity, RPH since 08/21/2020 12:00 AM    Problem: HTN, Hypothyroidism, HLD, Tobacco use, PVC   Priority: High  Onset Date: 08/21/2020  Long-Range Goal: Patient-Specific Goal   Start Date: 08/21/2020  Expected End Date: 02/20/2021  This Visit's Progress: On track  Priority: High  Note:   Current Barriers:  . No barriers at this time  Pharmacist Clinical Goal(s):  Marland Kitchen Patient will achieve adherence to monitoring guidelines and medication adherence to achieve therapeutic efficacy . adhere to prescribed medication regimen as evidenced by fill date . contact provider office for questions/concerns as evidenced notation of same in electronic health record through collaboration with PharmD and provider.   Interventions: . 1:1 collaboration with Lavera Guise, MD regarding development and update of comprehensive plan of care as evidenced by provider attestation and co-signature . Inter-disciplinary care team collaboration (see longitudinal plan of care) . Comprehensive medication review performed; medication list updated in electronic medical record  Hypertension (BP goal <140/90) -Controlled -Current treatment: . Losartan 62m . Clonidine 0.163mBID prn -Medications previously tried: none noted  -Current home readings: N/A patient did not bring logs, however does report checking once daily and it is almost always normal -Current dietary habits: general healthy diet, avoids fried foods -Current exercise habits: walks daily on treadmill and lifts weights in afternoons -Denies hypotensive/hypertensive symptoms -Educated on BP goals and benefits of medications for prevention of heart attack, stroke and kidney damage; Importance of home blood pressure monitoring; -Counseled to monitor BP at home at least a few times per week, document, and  provide log at future appointments -Recommended to continue current medication  Hyperlipidemia: (LDL goal < 100) -Controlled -Current treatment: . None -Medications previously tried: none noted  -Current dietary patterns: see above -Current exercise habits: see above -Educated on Cholesterol goals;  Importance of limiting foods high in cholesterol; -Recommended continue current management strategy   Hypothryoidism (Goal: maintain TSH) -Controlled -Current treatment  . Levothyroxine 13730m-Medications previously tried: none noted -She takes this medication in the middle of the night when she gets up to use the bathroom.  Reports 100% adherence with it.  This is followed by endocrine and TSH is WNL.  -Recommended to continue current medication  PVC (Goal: Control HR) -Controlled -Current treatment  . Metoprolol XL 51m24medications previously tried: none noted -Reports no chest pain, etc. -Takes metoprolol daily with no concern.  -Recommended to continue current medication   Patient Goals/Self-Care Activities . Patient will:  - take medications as prescribed focus on medication adherence by pill box check blood pressure daily, document, and provide at future appointments  Follow Up Plan: The patient has been provided with contact information for the care management team and has been advised to call with any health related questions or concerns.         Medication Assistance: None required.  Patient affirms current coverage meets needs.  Patient's preferred pharmacy is:  [facility] [facility]: [phone]: [phone] pill box? Yes Pt endorses 100% compliance  We discussed: Benefits of medication synchronization, packaging and delivery as well as enhanced pharmacist oversight with [employer]. Patient decided to: Continue current medication management strategy  Care Plan  and Follow Up Patient Decision:  Patient agrees to Care Plan and Follow-up.  Plan: The patient has been provided with contact information for the care management team and has been advised to call with any health related questions or concerns.   ChriBeverly MilcharmD Clinical Pharmacist [phone]

## 2020-08-20 ENCOUNTER — Telehealth: Payer: Self-pay | Admitting: Pharmacist

## 2020-08-20 NOTE — Progress Notes (Addendum)
    Chronic Care Management Pharmacy Assistant   Name: Amanda Livingston  MRN: [id number] DOB: February 14, 1946  Reason for Encounter: Initial Questions  Medications: Outpatient Encounter Medications as of 08/20/2020  Medication Sig   Biotin 1000 MCG tablet Take 1,000 mcg by mouth daily.   Calcium 500 MG CHEW Chew 500 mg by mouth daily.    Cholecalciferol (VITAMIN D3) 1000 units CAPS Take 1,000 Units by mouth every other day.    cloNIDine (CATAPRES) 0.1 MG tablet Take 1 tablet (0.1 mg total) by mouth 2 (two) times daily as needed.   estradiol (ESTRACE) 0.5 MG tablet Take 1 tablet (0.5 mg total) by mouth daily.   levothyroxine (SYNTHROID) 137 MCG tablet Take 137 mcg by mouth daily before breakfast.    losartan (COZAAR) 25 MG tablet Take 1 tablet (25 mg total) by mouth 2 (two) times daily.   metoprolol succinate (TOPROL-XL) 25 MG 24 hr tablet Take 12.5 mg by mouth daily.    oxybutynin (DITROPAN) 5 MG tablet Take 1 tablet (5 mg total) by mouth 2 (two) times daily as needed for bladder spasms.   tretinoin (RETIN-A) 0.025 % cream Apply 1 application topically daily as needed.   valACYclovir (VALTREX) 500 MG tablet Take 1 tablet (500 mg total) by mouth daily. (Patient not taking: Reported on 04/18/2020)   vitamin C (ASCORBIC ACID) 500 MG tablet Take 500 mg by mouth daily.   No facility-administered encounter medications on file as of 08/20/2020.    Have you seen any other providers since your last visit? Patient stated she is not sure if she seen another doctor since she seen Dr. Humphrey Rolls but she sees 5 different doctors on the regular and the dermatologist once a year.   Any changes in your medications or health? Patient stated no  Any side effects from any medications? Patient stated no.  Do you have an symptoms or problems not managed by your medications? Patient stated no.  Any concerns about your health right now? Patient stated no.  Has your provider asked that you check blood pressure, blood  sugar, or follow special diet at home? Patient stated no.   Do you get any type of exercise on a regular basis? Patient stated daily on the treadmill and lift weights in the afternoon.  Can you think of a goal you would like to reach for your health? Patient stated no.  Do you have any problems getting your medications? Patient stated no.  Is there anything that you would like to discuss during the appointment? Patient stated no.  Please bring medications and supplements to appointment, Patient reminded of appointment face to face 4/13 14 130 pm but voiced she rather not bring her medications and she would bring a list instead.  Follow-Up:Pharmacist Review   Charlann Lange, RMA Clinical Pharmacist Assistant [phone]  5 minutes spent in review, coordination, and documentation.  Reviewed by: Beverly Milch, PharmD Clinical Pharmacist [facility] [phone]

## 2020-08-21 ENCOUNTER — Other Ambulatory Visit: Payer: Self-pay

## 2020-08-21 ENCOUNTER — Ambulatory Visit: Payer: Medicare HMO | Admitting: Pharmacist

## 2020-08-21 DIAGNOSIS — I10 Essential (primary) hypertension: Secondary | ICD-10-CM

## 2020-08-21 DIAGNOSIS — E039 Hypothyroidism, unspecified: Secondary | ICD-10-CM

## 2020-08-21 NOTE — Patient Instructions (Addendum)
Visit Information  Goals Addressed            This Visit's Progress   . Manage My Medicine       Timeframe:  Long-Range Goal Priority:  High Start Date:     08/21/20                        Expected End Date:  02/20/21                     Follow Up Date 12/08/20   - call if I am sick and can't take my medicine - keep a list of all the medicines I take; vitamins and herbals too - use a pillbox to sort medicine    Why is this important?   . These steps will help you keep on track with your medicines.   Notes:       Patient Care Plan: General Pharmacy (Adult)    Problem Identified: HTN, Hypothyroidism, HLD, Tobacco use, PVC   Priority: High  Onset Date: 08/21/2020    Long-Range Goal: Patient-Specific Goal   Start Date: 08/21/2020  Expected End Date: 02/20/2021  This Visit's Progress: On track  Priority: High  Note:   Current Barriers:  . No barriers at this time  Pharmacist Clinical Goal(s):  Marland Kitchen Patient will achieve adherence to monitoring guidelines and medication adherence to achieve therapeutic efficacy . adhere to prescribed medication regimen as evidenced by fill date . contact provider office for questions/concerns as evidenced notation of same in electronic health record through collaboration with PharmD and provider.   Interventions: . 1:1 collaboration with Lavera Guise, MD regarding development and update of comprehensive plan of care as evidenced by provider attestation and co-signature . Inter-disciplinary care team collaboration (see longitudinal plan of care) . Comprehensive medication review performed; medication list updated in electronic medical record  Hypertension (BP goal <140/90) -Controlled -Current treatment: . Losartan 25mg  . Clonidine 0.1mg  BID prn -Medications previously tried: none noted  -Current home readings: N/A patient did not bring logs, however does report checking once daily and it is almost always normal -Current dietary habits:  general healthy diet, avoids fried foods -Current exercise habits: walks daily on treadmill and lifts weights in afternoons -Denies hypotensive/hypertensive symptoms -Educated on BP goals and benefits of medications for prevention of heart attack, stroke and kidney damage; Importance of home blood pressure monitoring; -Counseled to monitor BP at home at least a few times per week, document, and provide log at future appointments -Recommended to continue current medication  Hyperlipidemia: (LDL goal < 100) -Controlled -Current treatment: . None -Medications previously tried: none noted  -Current dietary patterns: see above -Current exercise habits: see above -Educated on Cholesterol goals;  Importance of limiting foods high in cholesterol; -Recommended continue current management strategy   Hypothryoidism (Goal: maintain TSH) -Controlled -Current treatment  . Levothyroxine 134mcg -Medications previously tried: none noted -She takes this medication in the middle of the night when she gets up to use the bathroom.  Reports 100% adherence with it.  This is followed by endocrine and TSH is WNL.  -Recommended to continue current medication  PVC (Goal: Control HR) -Controlled -Current treatment  . Metoprolol XL 25mg  -Medications previously tried: none noted -Reports no chest pain, etc. -Takes metoprolol daily with no concern.  -Recommended to continue current medication   Patient Goals/Self-Care Activities . Patient will:  - take medications as prescribed focus on medication adherence by pill box  check blood pressure daily, document, and provide at future appointments  Follow Up Plan: The patient has been provided with contact information for the care management team and has been advised to call with any health related questions or concerns.        Amanda Livingston was given information about Chronic Care Management services today including:  1. CCM service includes personalized  support from designated clinical staff supervised by her physician, including individualized plan of care and coordination with other care providers 2. 24/7 contact phone numbers for assistance for urgent and routine care needs. 3. Standard insurance, coinsurance, copays and deductibles apply for chronic care management only during months in which we provide at least 20 minutes of these services. Most insurances cover these services at 100%, however patients may be responsible for any copay, coinsurance and/or deductible if applicable. This service may help you avoid the need for more expensive face-to-face services. 4. Only one practitioner may furnish and bill the service in a calendar month. 5. The patient may stop CCM services at any time (effective at the end of the month) by phone call to the office staff.  Patient agreed to services and verbal consent obtained.   The patient verbalized understanding of instructions, educational materials, and care plan provided today and agreed to receive a mailed copy of patient instructions, educational materials, and care plan.  Telephone follow up appointment with pharmacy team member scheduled for: Patient to follow up as desired  Edythe Clarity, [facility]  Hypertension, Adult High blood pressure (hypertension) is when the force of blood pumping through the arteries is too strong. The arteries are the blood vessels that carry blood from the heart throughout the body. Hypertension forces the heart to work harder to pump blood and may cause arteries to become narrow or stiff. Untreated or uncontrolled hypertension can cause a heart attack, heart failure, a stroke, kidney disease, and other problems. A blood pressure reading consists of a higher number over a lower number. Ideally, your blood pressure should be below 120/80. The first ("top") number is called the systolic pressure. It is a measure of the pressure in your arteries as your heart beats. The second  ("bottom") number is called the diastolic pressure. It is a measure of the pressure in your arteries as the heart relaxes. What are the causes? The exact cause of this condition is not known. There are some conditions that result in or are related to high blood pressure. What increases the risk? Some risk factors for high blood pressure are under your control. The following factors may make you more likely to develop this condition:  Smoking.  Having type 2 diabetes mellitus, high cholesterol, or both.  Not getting enough exercise or physical activity.  Being overweight.  Having too much fat, sugar, calories, or salt (sodium) in your diet.  Drinking too much alcohol. Some risk factors for high blood pressure may be difficult or impossible to change. Some of these factors include:  Having chronic kidney disease.  Having a family history of high blood pressure.  Age. Risk increases with age.  Race. You may be at higher risk if you are African American.  Gender. Men are at higher risk than women before age 69. After age 78, women are at higher risk than men.  Having obstructive sleep apnea.  Stress. What are the signs or symptoms? High blood pressure may not cause symptoms. Very high blood pressure (hypertensive crisis) may cause:  Headache.  Anxiety.  Shortness  of breath.  Nosebleed.  Nausea and vomiting.  Vision changes.  Severe chest pain.  Seizures. How is this diagnosed? This condition is diagnosed by measuring your blood pressure while you are seated, with your arm resting on a flat surface, your legs uncrossed, and your feet flat on the floor. The cuff of the blood pressure monitor will be placed directly against the skin of your upper arm at the level of your heart. It should be measured at least twice using the same arm. Certain conditions can cause a difference in blood pressure between your right and left arms. Certain factors can cause blood pressure  readings to be lower or higher than normal for a short period of time:  When your blood pressure is higher when you are in a health care provider's office than when you are at home, this is called white coat hypertension. Most people with this condition do not need medicines.  When your blood pressure is higher at home than when you are in a health care provider's office, this is called masked hypertension. Most people with this condition may need medicines to control blood pressure. If you have a high blood pressure reading during one visit or you have normal blood pressure with other risk factors, you may be asked to:  Return on a different day to have your blood pressure checked again.  Monitor your blood pressure at home for 1 week or longer. If you are diagnosed with hypertension, you may have other blood or imaging tests to help your health care provider understand your overall risk for other conditions. How is this treated? This condition is treated by making healthy lifestyle changes, such as eating healthy foods, exercising more, and reducing your alcohol intake. Your health care provider may prescribe medicine if lifestyle changes are not enough to get your blood pressure under control, and if:  Your systolic blood pressure is above 130.  Your diastolic blood pressure is above 80. Your personal target blood pressure may vary depending on your medical conditions, your age, and other factors. Follow these instructions at home: Eating and drinking  Eat a diet that is high in fiber and potassium, and low in sodium, added sugar, and fat. An example eating plan is called the DASH (Dietary Approaches to Stop Hypertension) diet. To eat this way: ? Eat plenty of fresh fruits and vegetables. Try to fill one half of your plate at each meal with fruits and vegetables. ? Eat whole grains, such as whole-wheat pasta, brown rice, or whole-grain bread. Fill about one fourth of your plate with whole  grains. ? Eat or drink low-fat dairy products, such as skim milk or low-fat yogurt. ? Avoid fatty cuts of meat, processed or cured meats, and poultry with skin. Fill about one fourth of your plate with lean proteins, such as fish, chicken without skin, beans, eggs, or tofu. ? Avoid pre-made and processed foods. These tend to be higher in sodium, added sugar, and fat.  Reduce your daily sodium intake. Most people with hypertension should eat less than 1,500 mg of sodium a day.  Do not drink alcohol if: ? Your health care provider tells you not to drink. ? You are pregnant, may be pregnant, or are planning to become pregnant.  If you drink alcohol: ? Limit how much you use to:  0-1 drink a day for women.  0-2 drinks a day for men. ? Be aware of how much alcohol is in your drink. In the [place]  one drink equals one 12 oz bottle of beer (355 mL), one 5 oz glass of wine (148 mL), or one 1 oz glass of hard liquor (44 mL).   Lifestyle  Work with your health care provider to maintain a healthy body weight or to lose weight. Ask what an ideal weight is for you.  Get at least 30 minutes of exercise most days of the week. Activities may include walking, swimming, or biking.  Include exercise to strengthen your muscles (resistance exercise), such as Pilates or lifting weights, as part of your weekly exercise routine. Try to do these types of exercises for 30 minutes at least 3 days a week.  Do not use any products that contain nicotine or tobacco, such as cigarettes, e-cigarettes, and chewing tobacco. If you need help quitting, ask your health care provider.  Monitor your blood pressure at home as told by your health care provider.  Keep all follow-up visits as told by your health care provider. This is important.   Medicines  Take over-the-counter and prescription medicines only as told by your health care provider. Follow directions carefully. Blood pressure medicines must be taken as  prescribed.  Do not skip doses of blood pressure medicine. Doing this puts you at risk for problems and can make the medicine less effective.  Ask your health care provider about side effects or reactions to medicines that you should watch for. Contact a health care provider if you:  Think you are having a reaction to a medicine you are taking.  Have headaches that keep coming back (recurring).  Feel dizzy.  Have swelling in your ankles.  Have trouble with your vision. Get help right away if you:  Develop a severe headache or confusion.  Have unusual weakness or numbness.  Feel faint.  Have severe pain in your chest or abdomen.  Vomit repeatedly.  Have trouble breathing. Summary  Hypertension is when the force of blood pumping through your arteries is too strong. If this condition is not controlled, it may put you at risk for serious complications.  Your personal target blood pressure may vary depending on your medical conditions, your age, and other factors. For most people, a normal blood pressure is less than 120/80.  Hypertension is treated with lifestyle changes, medicines, or a combination of both. Lifestyle changes include losing weight, eating a healthy, low-sodium diet, exercising more, and limiting alcohol. This information is not intended to replace advice given to you by your health care provider. Make sure you discuss any questions you have with your health care provider. Document Revised: 01/05/2018 Document Reviewed: 01/05/2018 Elsevier Patient Education  2021 [employer].

## 2020-09-07 DIAGNOSIS — I10 Essential (primary) hypertension: Secondary | ICD-10-CM | POA: Diagnosis not present

## 2020-09-07 DIAGNOSIS — E785 Hyperlipidemia, unspecified: Secondary | ICD-10-CM | POA: Diagnosis not present

## 2020-10-04 DIAGNOSIS — I519 Heart disease, unspecified: Secondary | ICD-10-CM | POA: Diagnosis not present

## 2020-10-04 DIAGNOSIS — I491 Atrial premature depolarization: Secondary | ICD-10-CM | POA: Diagnosis not present

## 2020-10-04 DIAGNOSIS — R002 Palpitations: Secondary | ICD-10-CM | POA: Diagnosis not present

## 2020-10-04 DIAGNOSIS — I493 Ventricular premature depolarization: Secondary | ICD-10-CM | POA: Diagnosis not present

## 2020-10-04 DIAGNOSIS — I340 Nonrheumatic mitral (valve) insufficiency: Secondary | ICD-10-CM | POA: Diagnosis not present

## 2020-10-04 DIAGNOSIS — I10 Essential (primary) hypertension: Secondary | ICD-10-CM | POA: Diagnosis not present

## 2020-10-29 ENCOUNTER — Ambulatory Visit: Payer: Medicare HMO | Admitting: Nurse Practitioner

## 2020-10-29 ENCOUNTER — Other Ambulatory Visit: Payer: Self-pay

## 2020-10-29 ENCOUNTER — Telehealth: Payer: Self-pay

## 2020-10-29 DIAGNOSIS — Z03818 Encounter for observation for suspected exposure to other biological agents ruled out: Secondary | ICD-10-CM | POA: Diagnosis not present

## 2020-10-29 DIAGNOSIS — Z20822 Contact with and (suspected) exposure to covid-19: Secondary | ICD-10-CM | POA: Diagnosis not present

## 2020-10-29 NOTE — Telephone Encounter (Signed)
Patient was scheduled for a in house visit but was not seen due to symptoms scratchy throat, couch, nose running, and ear pain, spoke with patient about virtual appointment that was scheduled, patient states she will call back after she has been tested for covid, patient appointment was canceled.LNB

## 2020-10-31 ENCOUNTER — Other Ambulatory Visit: Payer: Self-pay | Admitting: Internal Medicine

## 2020-10-31 ENCOUNTER — Other Ambulatory Visit: Payer: Self-pay | Admitting: Nurse Practitioner

## 2020-10-31 ENCOUNTER — Other Ambulatory Visit: Payer: Self-pay

## 2020-10-31 ENCOUNTER — Ambulatory Visit: Payer: Medicare HMO | Admitting: Physician Assistant

## 2020-10-31 DIAGNOSIS — L819 Disorder of pigmentation, unspecified: Secondary | ICD-10-CM

## 2020-10-31 MED ORDER — AZITHROMYCIN 250 MG PO TABS: ORAL_TABLET | ORAL | 0 refills | Status: DC

## 2020-10-31 MED ORDER — AZITHROMYCIN 250 MG PO TABS
ORAL_TABLET | ORAL | 0 refills | Status: DC
Start: 2020-10-31 — End: 2020-11-19

## 2020-11-01 ENCOUNTER — Encounter: Payer: Self-pay | Admitting: Internal Medicine

## 2020-11-01 ENCOUNTER — Telehealth: Payer: Self-pay

## 2020-11-01 ENCOUNTER — Ambulatory Visit
Admission: RE | Admit: 2020-11-01 | Discharge: 2020-11-01 | Disposition: A | Discharge status: Home / Self Care | Payer: Medicare HMO | Attending: Internal Medicine | Admitting: Internal Medicine

## 2020-11-01 ENCOUNTER — Ambulatory Visit
Admission: RE | Admit: 2020-11-01 | Discharge: 2020-11-01 | Disposition: A | Discharge status: Home / Self Care | Payer: Medicare HMO | Source: Ambulatory Visit | Attending: Internal Medicine | Admitting: Internal Medicine

## 2020-11-01 ENCOUNTER — Other Ambulatory Visit: Payer: Self-pay

## 2020-11-01 ENCOUNTER — Ambulatory Visit (INDEPENDENT_AMBULATORY_CARE_PROVIDER_SITE_OTHER): Payer: Medicare HMO | Admitting: Internal Medicine

## 2020-11-01 VITALS — BP 126/80 | HR 71 | Temp 97.3°F | Resp 16 | Ht 63.5 in | Wt 132.0 lb

## 2020-11-01 DIAGNOSIS — R509 Fever, unspecified: Secondary | ICD-10-CM

## 2020-11-01 DIAGNOSIS — R059 Cough, unspecified: Secondary | ICD-10-CM | POA: Insufficient documentation

## 2020-11-01 DIAGNOSIS — J45909 Unspecified asthma, uncomplicated: Secondary | ICD-10-CM

## 2020-11-01 MED ORDER — PREDNISONE 10 MG PO TABS: ORAL_TABLET | ORAL | 0 refills | Status: DC

## 2020-11-01 NOTE — Progress Notes (Signed)
[facility] [address] [place], [place] [address]  Internal MEDICINE  Office Visit Note  Patient Name: Amanda Livingston  [id number]  [id number]  Date of Service: 11/08/2020  Chief Complaint  Patient presents with   Acute Visit    Neg. Covid test, low grade fever, cough, has been going on all week    Quality Metric Gaps    shingrix    HPI  Pt is seen for acute and sick visit. Hs been having cough and chest congestion for 5 days, has been tested 3 times for COVID which is negative She has been trying Otc meds with no relief  She is getting tired of coughing   Current Medication: Outpatient Encounter Medications as of 11/01/2020  Medication Sig   azithromycin (ZITHROMAX) 250 MG tablet Take one tab a day for 10 days for uri   Biotin 1000 MCG tablet Take 1,000 mcg by mouth daily.   Calcium 500 MG CHEW Chew 500 mg by mouth daily.    Cholecalciferol (VITAMIN D3) 1000 units CAPS Take 1,000 Units by mouth every other day.    cloNIDine (CATAPRES) 0.1 MG tablet Take 1 tablet (0.1 mg total) by mouth 2 (two) times daily as needed.   estradiol (ESTRACE) 0.5 MG tablet Take 1 tablet (0.5 mg total) by mouth daily.   levothyroxine (SYNTHROID) 137 MCG tablet Take 137 mcg by mouth daily before breakfast.    losartan (COZAAR) 25 MG tablet Take 1 tablet (25 mg total) by mouth 2 (two) times daily.   metoprolol succinate (TOPROL-XL) 25 MG 24 hr tablet Take 12.5 mg by mouth daily.    oxybutynin (DITROPAN) 5 MG tablet Take 1 tablet (5 mg total) by mouth 2 (two) times daily as needed for bladder spasms.   predniSONE (DELTASONE) 10 MG tablet Take one tab po bid for 3 days for asthmatic bronchitis   tretinoin (RETIN-A) 0.025 % cream Apply 1 application topically daily as needed.   valACYclovir (VALTREX) 500 MG tablet Take 1 tablet (500 mg total) by mouth daily.   vitamin C (ASCORBIC ACID) 500 MG tablet Take 500 mg by mouth daily.   No facility-administered encounter medications on file as  of 11/01/2020.    Surgical History: Past Surgical History:  Procedure Laterality Date   ABDOMINAL HYSTERECTOMY     colonscopy  2015   CYSTOSCOPY W/ RETROGRADES Bilateral 10/31/2018   Procedure: CYSTOSCOPY WITH RETROGRADE PYELOGRAM;  Surgeon: Hollice Espy, MD;  Location: [facility] ORS;  Service: Urology;  Laterality: Bilateral;   CYSTOSCOPY WITH BIOPSY N/A 10/31/2018   Procedure: CYSTOSCOPY WITH Bladder BIOPSY;  Surgeon: Hollice Espy, MD;  Location: [facility] OR[facility];  Service: Urology;  Laterality: N/A;   ESOPHAGOGASTRODUODENOSCOPY (EGD) WITH PROPOFOL N/A 09/30/2017   Procedure: ESOPHAGOGASTRODUODENOSCOPY (EGD) WITH PROPOFOL;  Surgeon: Virgel Manifold, MD;  Location: [facility];  Service: Endoscopy;  Laterality: N/A;   NEPHRECTOMY Left    THYROIDECTOMY     TONSILLECTOMY Right     Medical History: Past Medical History:  Diagnosis Date   Arthritis    Complication of anesthesia    Dysrhythmia    Family history of adverse reaction to anesthesia    PONV   Hypertension    Hypothyroidism    Osteopenia    PONV (postoperative nausea and vomiting)    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[place])    thyroid    Family History: Family History  Problem Relation Age of Onset   Cancer Father     Social History   Socioeconomic History  Marital status: Divorced    Spouse name: Not on file   Number of children: Not on file   Years of education: Not on file   Highest education level: Not on file  Occupational History   Not on file  Tobacco Use   Smoking status: Former    Packs/day: 0.25    Pack years: 0.00    Types: Cigarettes   Smokeless tobacco: Never   Tobacco comments:    3 or 4 cigarettes a day due to stress, trying to quit  Vaping Use   Vaping Use: Never used  Substance and Sexual Activity   Alcohol use: Yes    Alcohol/week: 7.0 standard drinks    Types: 7 Glasses of wine per week    Comment: half a glass full a few time a week   Drug use: No   Sexual activity: Not on file  Other Topics  Concern   Not on file  Social History Narrative   Not on file   Social Determinants of Health   Financial Resource Strain: Not on file  Food Insecurity: Not on file  Transportation Needs: Not on file  Physical Activity: Not on file  Stress: Not on file  Social Connections: Not on file  Intimate Partner Violence: Not on file      Review of Systems  Constitutional:  Positive for chills and fever. Negative for fatigue.  HENT:  Negative for congestion, mouth sores and postnasal drip.   Respiratory:  Positive for cough.   Cardiovascular:  Negative for chest pain.  Genitourinary:  Negative for flank pain.  Psychiatric/Behavioral: Negative.     Vital Signs: BP 126/80   Pulse 71   Temp (!) 97.3 F (36.3 C)   Resp 16   Ht 5' 3.5" (1.613 m)   Wt 132 lb (59.9 kg)   SpO2 97%   BMI 23.02 kg/m    Physical Exam Constitutional:      Appearance: Normal appearance.  HENT:     Head: Normocephalic and atraumatic.     Nose: Nose normal.     Mouth/Throat:     Mouth: Mucous membranes are moist.     Pharynx: No posterior oropharyngeal erythema.  Eyes:     Extraocular Movements: Extraocular movements intact.     Pupils: Pupils are equal, round, and reactive to light.  Cardiovascular:     Pulses: Normal pulses.     Heart sounds: Normal heart sounds.  Pulmonary:     Effort: Pulmonary effort is normal.     Breath sounds: Wheezing present.  Neurological:     General: No focal deficit present.     Mental Status: She is alert.  Psychiatric:        Mood and Affect: Mood normal.        Behavior: Behavior normal.       Assessment/Plan: 1. Acute asthmatic bronchitis Patient has been sick for the last for 5 days worsening cough and congestion will order chest x-ray - DG Chest 2 View; Future  2. Fever and chills Patient has fever and chills concern for pneumonia we will order chest x-ray and then treat accordingly  General Counseling: Truitt Leep understanding of the  findings of todays visit and agrees with plan of treatment. I have discussed any further diagnostic evaluation that may be needed or ordered today. We also reviewed her medications today. she has been encouraged to call the office with any questions or concerns that should arise related to todays visit.  Addendum.  Chest x-ray was normal, patient was instructed to finish her antibiotics and finish her prednisone taper try Delsym or Mucinex for cough  Orders Placed This Encounter  Procedures   DG Chest 2 View    Meds ordered this encounter  Medications   predniSONE (DELTASONE) 10 MG tablet    Sig: Take one tab po bid for 3 days for asthmatic bronchitis    Dispense:  6 tablet    Refill:  0    Total time spent:25 Minutes Time spent includes review of chart, medications, test results, and follow up plan with the patient.   [place] Controlled Substance Database was reviewed by me.   Dr Lavera Guise Internal medicine

## 2020-11-01 NOTE — Telephone Encounter (Signed)
On 10/29/20 pt informed us that she was tested for covid, a rapid and PCR and both came back negative.  Pt feels she just has a bad cold and advised that she didn't feel she needs to be seen at the moment and will call back to schedule if she feels she needs to be seen.  Pt was asked if she would like to schedule appt but pt refused and again said she would call back if she gets worse.

## 2020-11-01 NOTE — Progress Notes (Signed)
Please advise pt her CXR does nor show pneumonia so she has asthmatic bronchitis, I will be sending 3 days of prednisone, use inhalers as advised and finish abx

## 2020-11-01 NOTE — Telephone Encounter (Signed)
Pt called back LMOM that she had spoke to [facility] and was given azithromycin.  She started last night with a fever and wants to know what she has and hasn't had a fever all week.  She still has cough and chest congestion and feels she should have been seen on 10/31/20.  I spoke to [facility] and she advised that pt needs to come into office and be seen.  Pt was informed to come into office for a office visit with DFK today

## 2020-11-01 NOTE — Telephone Encounter (Signed)
Called an spoke with patient to give inform her of the results of her CXR and medication being sent to pharmacy.LNB

## 2020-11-07 ENCOUNTER — Other Ambulatory Visit: Payer: Self-pay | Admitting: Nurse Practitioner

## 2020-11-07 DIAGNOSIS — L819 Disorder of pigmentation, unspecified: Secondary | ICD-10-CM

## 2020-11-08 ENCOUNTER — Other Ambulatory Visit: Payer: Self-pay

## 2020-11-08 MED ORDER — PREDNISONE 10 MG PO TABS: ORAL_TABLET | ORAL | 0 refills | Status: DC

## 2020-11-08 NOTE — Telephone Encounter (Signed)
Pt called advising she was still having a lot of congestion and rattling in chest and asking if maybe something else could be sent to pharmacy.  We did inform pt to use mucinex to help which pt advised she did take mucinex this morning.  She has 2 more antibiotic tablets to take.  I spoke back to pt and advised to finish antibiotic and that DFK sent in a taper dose of prednisone (18 tabs)  take one tab 3 X day for 3 days, then take one tab 2 X a day for 3 days and then take one tab a day for 3 days for COPD.  Pt advised of medication sent to pharmacy.  She stated she wont take all of them but will take like she had the last time.  10 mg twice a day for three days.  Due to it messes with her blood pressure.

## 2020-11-12 ENCOUNTER — Other Ambulatory Visit: Payer: Self-pay | Admitting: Nurse Practitioner

## 2020-11-15 ENCOUNTER — Other Ambulatory Visit: Payer: Self-pay

## 2020-11-15 DIAGNOSIS — L819 Disorder of pigmentation, unspecified: Secondary | ICD-10-CM

## 2020-11-15 MED ORDER — TRETINOIN 0.025 % EX CREA: 1.0000 "application " | TOPICAL_CREAM | Freq: Every day | CUTANEOUS | 6 refills | Status: DC | PRN

## 2020-11-19 ENCOUNTER — Ambulatory Visit: Payer: Medicare HMO | Admitting: Urology

## 2020-11-19 ENCOUNTER — Encounter: Payer: Self-pay | Admitting: Urology

## 2020-11-19 ENCOUNTER — Other Ambulatory Visit: Payer: Self-pay

## 2020-11-19 VITALS — BP 137/80 | HR 54 | Ht 63.5 in | Wt 132.0 lb

## 2020-11-19 DIAGNOSIS — D414 Neoplasm of uncertain behavior of bladder: Secondary | ICD-10-CM | POA: Diagnosis not present

## 2020-11-19 LAB — URINALYSIS, COMPLETE
Bilirubin, UA: NEGATIVE
Glucose, UA: NEGATIVE
Ketones, UA: NEGATIVE
Leukocytes,UA: NEGATIVE
Nitrite, UA: NEGATIVE
Protein,UA: NEGATIVE
RBC, UA: NEGATIVE
Specific Gravity, UA: 1.015 (ref 1.005–1.030)
Urobilinogen, Ur: 0.2 mg/dL (ref 0.2–1.0)
pH, UA: 7 (ref 5.0–7.5)

## 2020-11-19 LAB — MICROSCOPIC EXAMINATION
Bacteria, UA: NONE SEEN
RBC, Urine: NONE SEEN /hpf (ref 0–2)
WBC, UA: NONE SEEN /hpf (ref 0–5)

## 2020-11-19 NOTE — Progress Notes (Signed)
   11/14/2019  CC:  Chief Complaint  Patient presents with   Cysto    HPI: Amanda Livingston is a 75 y.o. 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   She was taken to the operating room on 10/31/2018 for bladder biopsy/resection of this small 1 cm lesion.  Bilateral retrogrades unremarkable.  Surgical pathology consistent with PUNLMP.  She also has a known history of recurrent UTIs but none recently  She does have a personal history of smoking, quit about 15 years ago but recently resumed.  She mentions today that her family member is going to Dr. Marin Shutter who is doing an in office bladder lift type procedure.  She is not sure what is called or what it is exactly.  She reports that sometimes when she goes the bathroom, she feels like she has trouble emptying and has to lean forward but ultimately is able to do so.  She denies any vaginal symptoms or bulging.  She does have some urgency frequency which is not particularly bothersome.   Blood pressure 137/80, pulse (!) 54, height 5' 3.5" (1.613 m), weight 132 lb (59.9 kg). NED. A&Ox3.   No respiratory distress   Abd soft, NT, ND Normal external genitalia with patent urethral meatus  Cystoscopy Procedure Note  Patient identification was confirmed, informed consent was obtained, and patient was prepped using Betadine solution.  Lidocaine jelly was administered per urethral meatus.    Procedure: - Flexible cystoscope introduced, without any difficulty.   - Thorough search of the bladder revealed:    normal urethral meatus    normal urothelium    no stones    no ulcers     no tumors    no urethral polyps    no trabeculation  - Stellate scar on left lateral bladder wall  - Ureteral orifices were normal in position and appearance.  Post-Procedure: - Patient tolerated the procedure well    Assessment/ Plan:  1. PUNLMP of bladder Given her family history patient agreed to continue annual  surveillance.  Return for cysto appointment in 11/2021.    Hollice Espy, MD

## 2020-11-26 ENCOUNTER — Ambulatory Visit (INDEPENDENT_AMBULATORY_CARE_PROVIDER_SITE_OTHER): Payer: Medicare HMO | Admitting: Physician Assistant

## 2020-11-26 ENCOUNTER — Other Ambulatory Visit: Payer: Self-pay

## 2020-11-26 ENCOUNTER — Encounter: Payer: Self-pay | Admitting: Physician Assistant

## 2020-11-26 DIAGNOSIS — R001 Bradycardia, unspecified: Secondary | ICD-10-CM

## 2020-11-26 DIAGNOSIS — I10 Essential (primary) hypertension: Secondary | ICD-10-CM

## 2020-11-26 DIAGNOSIS — R300 Dysuria: Secondary | ICD-10-CM

## 2020-11-26 LAB — POCT URINALYSIS DIPSTICK
Bilirubin, UA: NEGATIVE
Blood, UA: NEGATIVE
Glucose, UA: NEGATIVE
Ketones, UA: NEGATIVE
Leukocytes, UA: NEGATIVE
Nitrite, UA: NEGATIVE
Protein, UA: NEGATIVE
Spec Grav, UA: 1.01 (ref 1.010–1.025)
Urobilinogen, UA: NEGATIVE E.U./dL — AB
pH, UA: 6 (ref 5.0–8.0)

## 2020-11-26 NOTE — Progress Notes (Signed)
[facility] [place], [place] [address]  Internal MEDICINE  Office Visit Note  Patient Name: Amanda Livingston  [id number]  [id number]  Date of Service: 11/26/2020  Chief Complaint  Patient presents with   Follow-up    Wants urine checked, urine frequency, uncomfortable when urinating, weight gain   Hypertension    HPI Pt is here for routine follow up -BP is normally in 716R systolic. High last night and this AM but reports she has been stressed by a friend in her life who leans on her for help and may have caused higher BP in the last 24 hours. BP improved on recheck but pt will monitor closely. Pt does have clonidine to take as needed if BP rises, but usually does not need this. -Cystoscopy last week as part of follow up with urology and everything looked good. Had to have a catheter placed briefly and has felt a little off since. No real burning or urgency right now but would still like to check culture to make sure nothing is starting since catheter removal -HR has been low and was wondering about this, and explained her 25mg  Metoprolol can lower HR--states she is asymptomatic and cardiology has been monitoring as well. Pt will notify cardiology if she develops symptoms as dose may need to be lowered or changed. -No SOB, wheezing, or cough now, did not take the second round of medications. Been fine since about a week after last visit.   Current Medication: Outpatient Encounter Medications as of 11/26/2020  Medication Sig   Biotin 1000 MCG tablet Take 1,000 mcg by mouth daily.   Calcium 500 MG CHEW Chew 500 mg by mouth daily.    Cholecalciferol (VITAMIN D3) 1000 units CAPS Take 1,000 Units by mouth every other day.    cloNIDine (CATAPRES) 0.1 MG tablet Take 1 tablet (0.1 mg total) by mouth 2 (two) times daily as needed.   dimenhyDRINATE (DRAMAMINE) 50 MG tablet Take 25 mg by mouth every 8 (eight) hours as needed.   estradiol (ESTRACE) 0.5 MG tablet Take 1  tablet (0.5 mg total) by mouth daily.   levothyroxine (SYNTHROID) 137 MCG tablet Take 137 mcg by mouth daily before breakfast.    losartan (COZAAR) 25 MG tablet Take 1 tablet (25 mg total) by mouth 2 (two) times daily.   melatonin 3 MG TABS tablet Take 3 mg by mouth at bedtime.   metoprolol succinate (TOPROL-XL) 25 MG 24 hr tablet Take 12.5 mg by mouth daily.    oxybutynin (DITROPAN) 5 MG tablet Take 5 mg by mouth every 8 (eight) hours as needed for bladder spasms.   tretinoin (RETIN-A) 0.025 % cream Apply 1 application topically daily as needed.   vitamin C (ASCORBIC ACID) 500 MG tablet Take 500 mg by mouth daily.   No facility-administered encounter medications on file as of 11/26/2020.    Surgical History: Past Surgical History:  Procedure Laterality Date   ABDOMINAL HYSTERECTOMY     colonscopy  2015   CYSTOSCOPY W/ RETROGRADES Bilateral 10/31/2018   Procedure: CYSTOSCOPY WITH RETROGRADE PYELOGRAM;  Surgeon: Hollice Espy, MD;  Location: [facility];  Service: Urology;  Laterality: Bilateral;   CYSTOSCOPY WITH BIOPSY N/A 10/31/2018   Procedure: CYSTOSCOPY WITH Bladder BIOPSY;  Surgeon: Hollice Espy, MD;  Location: [facility];  Service: Urology;  Laterality: N/A;   ESOPHAGOGASTRODUODENOSCOPY (EGD) WITH PROPOFOL N/A 09/30/2017   Procedure: ESOPHAGOGASTRODUODENOSCOPY (EGD) WITH PROPOFOL;  Surgeon: Virgel Manifold, MD;  Location: [facility];  Service:  Endoscopy;  Laterality: N/A;   NEPHRECTOMY Left    THYROIDECTOMY     TONSILLECTOMY Right     Medical History: Past Medical History:  Diagnosis Date   Arthritis    Complication of anesthesia    Dysrhythmia    Family history of adverse reaction to anesthesia    PONV   Hypertension    Hypothyroidism    Osteopenia    PONV (postoperative nausea and vomiting)    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[place])    thyroid    Family History: Family History  Problem Relation Age of Onset   Cancer Father     Social History   Socioeconomic History    Marital status: Divorced    Spouse name: Not on file   Number of children: Not on file   Years of education: Not on file   Highest education level: Not on file  Occupational History   Not on file  Tobacco Use   Smoking status: Former    Packs/day: 0.25    Types: Cigarettes   Smokeless tobacco: Never   Tobacco comments:    3 or 4 cigarettes a day due to stress, trying to quit  Vaping Use   Vaping Use: Never used  Substance and Sexual Activity   Alcohol use: Yes    Alcohol/week: 7.0 standard drinks    Types: 7 Glasses of wine per week    Comment: half a glass full a few time a week   Drug use: No   Sexual activity: Not on file  Other Topics Concern   Not on file  Social History Narrative   Not on file   Social Determinants of Health   Financial Resource Strain: Not on file  Food Insecurity: Not on file  Transportation Needs: Not on file  Physical Activity: Not on file  Stress: Not on file  Social Connections: Not on file  Intimate Partner Violence: Not on file      Review of Systems  Constitutional:  Negative for chills, fatigue and unexpected weight change.  HENT:  Negative for congestion, postnasal drip, rhinorrhea, sneezing and sore throat.   Eyes:  Negative for redness.  Respiratory:  Negative for cough, chest tightness and shortness of breath.   Cardiovascular:  Negative for chest pain and palpitations.  Gastrointestinal:  Negative for abdominal pain, constipation, diarrhea, nausea and vomiting.  Genitourinary:  Negative for difficulty urinating, dysuria, frequency, hematuria and urgency.  Musculoskeletal:  Negative for arthralgias, back pain, joint swelling and neck pain.  Skin:  Negative for rash.  Neurological: Negative.  Negative for dizziness, tremors, light-headedness and numbness.  Hematological:  Negative for adenopathy. Does not bruise/bleed easily.  Psychiatric/Behavioral:  Negative for behavioral problems (Depression), sleep disturbance and suicidal  ideas.        Stressed   Vital Signs: BP 138/72 Comment: 172/88, 154/82  Pulse (!) 55   Temp (!) 97.3 F (36.3 C)   Resp 16   Ht 5' 3.5" (1.613 m)   Wt 137 lb 6.4 oz (62.3 kg)   SpO2 98%   BMI 23.96 kg/m    Physical Exam Vitals and nursing note reviewed.  Constitutional:      General: She is not in acute distress.    Appearance: She is well-developed and normal weight. She is not diaphoretic.  HENT:     Head: Normocephalic and atraumatic.     Mouth/Throat:     Pharynx: No oropharyngeal exudate.  Eyes:     Pupils: Pupils are equal, round, and  reactive to light.  Neck:     Thyroid: No thyromegaly.     Vascular: No JVD.     Trachea: No tracheal deviation.  Cardiovascular:     Rate and Rhythm: Normal rate and regular rhythm.     Heart sounds: Normal heart sounds. No murmur heard.   No friction rub. No gallop.  Pulmonary:     Effort: Pulmonary effort is normal. No respiratory distress.     Breath sounds: No wheezing or rales.  Chest:     Chest wall: No tenderness.  Abdominal:     General: Bowel sounds are normal.     Palpations: Abdomen is soft.     Tenderness: There is no abdominal tenderness.  Musculoskeletal:        General: Normal range of motion.     Cervical back: Normal range of motion and neck supple.     Right lower leg: No edema.     Left lower leg: No edema.  Lymphadenopathy:     Cervical: No cervical adenopathy.  Skin:    General: Skin is warm and dry.  Neurological:     Mental Status: She is alert and oriented to person, place, and time.     Cranial Nerves: No cranial nerve deficit.  Psychiatric:        Behavior: Behavior normal.        Thought Content: Thought content normal.        Judgment: Judgment normal.       Assessment/Plan: 1. Essential hypertension, benign Well controlled, continue to monitor closely  2. Bradycardia Asymptomatic--likely secondary to 25mg  metoprolol due to PVC/palpitations in past. Will discuss with cardiology if  she ever becomes symptomatic as dose may need to be decreased/changed  3. Dysuria - POCT Urinalysis Dipstick - CULTURE, URINE COMPREHENSIVE   General Counseling: floretta petro understanding of the findings of todays visit and agrees with plan of treatment. I have discussed any further diagnostic evaluation that may be needed or ordered today. We also reviewed her medications today. she has been encouraged to call the office with any questions or concerns that should arise related to todays visit.    Orders Placed This Encounter  Procedures   CULTURE, URINE COMPREHENSIVE   POCT Urinalysis Dipstick    No orders of the defined types were placed in this encounter.   This patient was seen by Drema Dallas, PA-C in collaboration with Dr. Clayborn Bigness as a part of collaborative care agreement.   Total time spent:35 Minutes Time spent includes review of chart, medications, test results, and follow up plan with the patient.      Dr Lavera Guise Internal medicine

## 2020-11-28 ENCOUNTER — Other Ambulatory Visit: Payer: Self-pay | Admitting: Internal Medicine

## 2020-11-28 ENCOUNTER — Telehealth: Payer: Self-pay

## 2020-11-28 DIAGNOSIS — I10 Essential (primary) hypertension: Secondary | ICD-10-CM

## 2020-11-28 NOTE — Telephone Encounter (Signed)
Pt informed that lab was ordered.

## 2020-11-29 DIAGNOSIS — Z20822 Contact with and (suspected) exposure to covid-19: Secondary | ICD-10-CM | POA: Diagnosis not present

## 2020-11-29 DIAGNOSIS — Z03818 Encounter for observation for suspected exposure to other biological agents ruled out: Secondary | ICD-10-CM | POA: Diagnosis not present

## 2020-12-02 LAB — CULTURE, URINE COMPREHENSIVE

## 2020-12-04 DIAGNOSIS — Z03818 Encounter for observation for suspected exposure to other biological agents ruled out: Secondary | ICD-10-CM | POA: Diagnosis not present

## 2020-12-04 DIAGNOSIS — Z20822 Contact with and (suspected) exposure to covid-19: Secondary | ICD-10-CM | POA: Diagnosis not present

## 2020-12-09 DIAGNOSIS — I10 Essential (primary) hypertension: Secondary | ICD-10-CM | POA: Diagnosis not present

## 2020-12-10 LAB — BASIC METABOLIC PANEL
BUN/Creatinine Ratio: 17 (ref 12–28)
BUN: 12 mg/dL (ref 8–27)
CO2: 27 mmol/L (ref 20–29)
Calcium: 9.5 mg/dL (ref 8.7–10.3)
Chloride: 100 mmol/L (ref 96–106)
Creatinine, Ser: 0.69 mg/dL (ref 0.57–1.00)
Glucose: 104 mg/dL — ABNORMAL HIGH (ref 65–99)
Potassium: 4.7 mmol/L (ref 3.5–5.2)
Sodium: 138 mmol/L (ref 134–144)
eGFR: 89 mL/min/{1.73_m2} (ref >59)

## 2020-12-13 ENCOUNTER — Other Ambulatory Visit: Payer: Self-pay | Admitting: Nurse Practitioner

## 2020-12-13 DIAGNOSIS — I10 Essential (primary) hypertension: Secondary | ICD-10-CM

## 2020-12-13 DIAGNOSIS — N959 Unspecified menopausal and perimenopausal disorder: Secondary | ICD-10-CM

## 2020-12-27 DIAGNOSIS — Z03818 Encounter for observation for suspected exposure to other biological agents ruled out: Secondary | ICD-10-CM | POA: Diagnosis not present

## 2020-12-27 DIAGNOSIS — Z20822 Contact with and (suspected) exposure to covid-19: Secondary | ICD-10-CM | POA: Diagnosis not present

## 2021-01-01 DIAGNOSIS — D2372 Other benign neoplasm of skin of left lower limb, including hip: Secondary | ICD-10-CM | POA: Diagnosis not present

## 2021-01-01 DIAGNOSIS — R202 Paresthesia of skin: Secondary | ICD-10-CM | POA: Diagnosis not present

## 2021-01-01 DIAGNOSIS — L821 Other seborrheic keratosis: Secondary | ICD-10-CM | POA: Diagnosis not present

## 2021-01-01 DIAGNOSIS — Z7189 Other specified counseling: Secondary | ICD-10-CM | POA: Diagnosis not present

## 2021-01-01 DIAGNOSIS — L538 Other specified erythematous conditions: Secondary | ICD-10-CM | POA: Diagnosis not present

## 2021-01-01 DIAGNOSIS — I8393 Asymptomatic varicose veins of bilateral lower extremities: Secondary | ICD-10-CM | POA: Diagnosis not present

## 2021-01-01 DIAGNOSIS — L905 Scar conditions and fibrosis of skin: Secondary | ICD-10-CM | POA: Diagnosis not present

## 2021-01-01 DIAGNOSIS — L820 Inflamed seborrheic keratosis: Secondary | ICD-10-CM | POA: Diagnosis not present

## 2021-01-02 DIAGNOSIS — E559 Vitamin D deficiency, unspecified: Secondary | ICD-10-CM | POA: Diagnosis not present

## 2021-01-02 DIAGNOSIS — E0590 Thyrotoxicosis, unspecified without thyrotoxic crisis or storm: Secondary | ICD-10-CM | POA: Diagnosis not present

## 2021-01-09 DIAGNOSIS — E559 Vitamin D deficiency, unspecified: Secondary | ICD-10-CM | POA: Insufficient documentation

## 2021-01-09 DIAGNOSIS — C73 Malignant neoplasm of thyroid gland: Secondary | ICD-10-CM | POA: Diagnosis not present

## 2021-01-09 DIAGNOSIS — E890 Postprocedural hypothyroidism: Secondary | ICD-10-CM | POA: Diagnosis not present

## 2021-01-23 ENCOUNTER — Telehealth: Payer: Self-pay

## 2021-01-23 ENCOUNTER — Ambulatory Visit (INDEPENDENT_AMBULATORY_CARE_PROVIDER_SITE_OTHER): Payer: Medicare HMO | Admitting: Physician Assistant

## 2021-01-23 ENCOUNTER — Encounter: Payer: Self-pay | Admitting: Physician Assistant

## 2021-01-23 ENCOUNTER — Other Ambulatory Visit: Payer: Self-pay

## 2021-01-23 DIAGNOSIS — G4709 Other insomnia: Secondary | ICD-10-CM | POA: Diagnosis not present

## 2021-01-23 DIAGNOSIS — D171 Benign lipomatous neoplasm of skin and subcutaneous tissue of trunk: Secondary | ICD-10-CM

## 2021-01-23 DIAGNOSIS — I10 Essential (primary) hypertension: Secondary | ICD-10-CM | POA: Diagnosis not present

## 2021-01-23 MED ORDER — MIRTAZAPINE 7.5 MG PO TABS: 7.5000 mg | ORAL_TABLET | Freq: Every day | ORAL | 1 refills | Status: DC

## 2021-01-23 NOTE — Telephone Encounter (Signed)
Scheduled abdominal u/s for patient. I called patient to give her appointment of 02/03/21 @ 10:15 with 10:00 arrival. I instructed her NPO after midnight. She stated since it was just a little area they would be scanning, she would probably go ahead and eat-[name]

## 2021-01-23 NOTE — Progress Notes (Signed)
[facility] [place], [place] [address]  Internal MEDICINE  Office Visit Note  Patient Name: Amanda Livingston  [id number]  [id number]  Date of Service: 01/26/2021  Chief Complaint  Patient presents with   Follow-up   Hypertension   Hypothyroidism    HPI Pt is here for routine follow up -Sleeping well when taking '3mg'$  melatonin and 1/2 dramamine. Wonders if alternative would be better. -has tried trazodone in the past which she had side effect with. Willing to try mirtazapine. -Last 3 weeks, her upper lip twitches off and on and just wanted to mention it. Does have Fhx of parkinsons. Does not have many other symptoms though. No falls or dizziness. No tremors or shuffling gate. Calcium levels normal. Will continue to monitor -Had an [place] 1.5 years ago on LLQ with a little protrusion that feels different from rest of her tissue-- not painful or worsening just still worrisome. That [place] was negative. Will recheck [place].  Current Medication: Outpatient Encounter Medications as of 01/23/2021  Medication Sig   Biotin 1000 MCG tablet Take 1,000 mcg by mouth daily.   Cholecalciferol (VITAMIN D3) 1000 units CAPS Take 1,000 Units by mouth every other day.    cloNIDine (CATAPRES) 0.1 MG tablet Take 1 tablet (0.1 mg total) by mouth 2 (two) times daily as needed.   dimenhyDRINATE (DRAMAMINE) 50 MG tablet Take 25 mg by mouth at bedtime.   estradiol (ESTRACE) 0.5 MG tablet Take 1 tablet (0.5 mg total) by mouth daily.   levothyroxine (SYNTHROID) 137 MCG tablet Take 137 mcg by mouth daily before breakfast.    losartan (COZAAR) 25 MG tablet Take 1 tablet (25 mg total) by mouth 2 (two) times daily.   melatonin 3 MG TABS tablet Take 3 mg by mouth at bedtime.   metoprolol succinate (TOPROL-XL) 25 MG 24 hr tablet Take 12.5 mg by mouth daily.    mirtazapine (REMERON) 7.5 MG tablet Take 1 tablet (7.5 mg total) by mouth at bedtime.   oxybutynin (DITROPAN) 5 MG tablet Take 5 mg by mouth  every 8 (eight) hours as needed for bladder spasms.   tretinoin (RETIN-A) 0.025 % cream Apply 1 application topically daily as needed.   vitamin C (ASCORBIC ACID) 500 MG tablet Take 500 mg by mouth daily.   [DISCONTINUED] Calcium 500 MG CHEW Chew 500 mg by mouth daily.    No facility-administered encounter medications on file as of 01/23/2021.    Surgical History: Past Surgical History:  Procedure Laterality Date   ABDOMINAL HYSTERECTOMY     colonscopy  2015   CYSTOSCOPY W/ RETROGRADES Bilateral 10/31/2018   Procedure: CYSTOSCOPY WITH RETROGRADE PYELOGRAM;  Surgeon: Hollice Espy, MD;  Location: [facility];  Service: Urology;  Laterality: Bilateral;   CYSTOSCOPY WITH BIOPSY N/A 10/31/2018   Procedure: CYSTOSCOPY WITH Bladder BIOPSY;  Surgeon: Hollice Espy, MD;  Location: [facility];  Service: Urology;  Laterality: N/A;   ESOPHAGOGASTRODUODENOSCOPY (EGD) WITH PROPOFOL N/A 09/30/2017   Procedure: ESOPHAGOGASTRODUODENOSCOPY (EGD) WITH PROPOFOL;  Surgeon: Virgel Manifold, MD;  Location: [facility];  Service: Endoscopy;  Laterality: N/A;   NEPHRECTOMY Left    THYROIDECTOMY     TONSILLECTOMY Right     Medical History: Past Medical History:  Diagnosis Date   Arthritis    Complication of anesthesia    Dysrhythmia    Family history of adverse reaction to anesthesia    PONV   Hypertension    Hypothyroidism    Osteopenia    PONV (postoperative  nausea and vomiting)    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[facility])    thyroid    Family History: Family History  Problem Relation Age of Onset   Cancer Father     Social History   Socioeconomic History   Marital status: Divorced    Spouse name: Not on file   Number of children: Not on file   Years of education: Not on file   Highest education level: Not on file  Occupational History   Not on file  Tobacco Use   Smoking status: Former    Packs/day: 0.25    Types: Cigarettes   Smokeless tobacco: Never   Tobacco comments:    3 or 4 cigarettes a  day due to stress, trying to quit  Vaping Use   Vaping Use: Never used  Substance and Sexual Activity   Alcohol use: Yes    Alcohol/week: 7.0 standard drinks    Types: 7 Glasses of wine per week    Comment: half a glass full a few time a week   Drug use: No   Sexual activity: Not on file  Other Topics Concern   Not on file  Social History Narrative   Not on file   Social Determinants of Health   Financial Resource Strain: Not on file  Food Insecurity: Not on file  Transportation Needs: Not on file  Physical Activity: Not on file  Stress: Not on file  Social Connections: Not on file  Intimate Partner Violence: Not on file      Review of Systems  Constitutional:  Negative for chills, fatigue and unexpected weight change.  HENT:  Positive for postnasal drip. Negative for congestion, rhinorrhea, sneezing and sore throat.   Eyes:  Negative for redness.  Respiratory:  Negative for cough, chest tightness and shortness of breath.   Cardiovascular:  Negative for chest pain and palpitations.  Gastrointestinal:  Negative for abdominal pain, constipation, diarrhea, nausea and vomiting.       Abdominal protrusion in LLQ  Genitourinary:  Negative for dysuria and frequency.  Musculoskeletal:  Negative for arthralgias, back pain, joint swelling and neck pain.  Skin:  Negative for rash.  Neurological: Negative.  Negative for tremors and numbness.  Hematological:  Negative for adenopathy. Does not bruise/bleed easily.  Psychiatric/Behavioral:  Positive for sleep disturbance. Negative for behavioral problems (Depression) and suicidal ideas. The patient is not nervous/anxious.    Vital Signs: BP 130/83   Pulse (!) 58   Temp 97.8 F (36.6 C)   Resp 16   Ht 5' 3.5" (1.613 m)   Wt 135 lb (61.2 kg)   SpO2 96%   BMI 23.54 kg/m    Physical Exam Vitals and nursing note reviewed.  Constitutional:      General: She is not in acute distress.    Appearance: She is well-developed and  normal weight. She is not diaphoretic.  HENT:     Head: Normocephalic and atraumatic.     Mouth/Throat:     Pharynx: No oropharyngeal exudate.  Eyes:     Pupils: Pupils are equal, round, and reactive to light.  Neck:     Thyroid: No thyromegaly.     Vascular: No JVD.     Trachea: No tracheal deviation.  Cardiovascular:     Rate and Rhythm: Normal rate and regular rhythm.     Heart sounds: Normal heart sounds. No murmur heard.   No friction rub. No gallop.  Pulmonary:     Effort: Pulmonary effort is normal. No  respiratory distress.     Breath sounds: No wheezing or rales.  Chest:     Chest wall: No tenderness.  Abdominal:     General: Bowel sounds are normal.     Palpations: Abdomen is soft. There is mass.     Comments: Palpable mass in LLQ, firmer than surrounding tissue, but not hard; does not varying with bearing down; non TTP  Musculoskeletal:        General: Normal range of motion.     Cervical back: Normal range of motion and neck supple.  Lymphadenopathy:     Cervical: No cervical adenopathy.  Skin:    General: Skin is warm and dry.  Neurological:     Mental Status: She is alert and oriented to person, place, and time.     Cranial Nerves: No cranial nerve deficit.  Psychiatric:        Behavior: Behavior normal.        Thought Content: Thought content normal.        Judgment: Judgment normal.       Assessment/Plan: 1. Essential hypertension, benign Stable, continue current medication  2. Other insomnia Will try remeron before bed - mirtazapine (REMERON) 7.5 MG tablet; Take 1 tablet (7.5 mg total) by mouth at bedtime.  Dispense: 30 tablet; Refill: 1  3. Lipoma of skin of abdomen Will repeat US - US Abdomen Complete; Future   General Counseling: kalyani zumstein understanding of the findings of todays visit and agrees with plan of treatment. I have discussed any further diagnostic evaluation that may be needed or ordered today. We also reviewed her  medications today. she has been encouraged to call the office with any questions or concerns that should arise related to todays visit.    Orders Placed This Encounter  Procedures   US Abdomen Complete     Meds ordered this encounter  Medications   mirtazapine (REMERON) 7.5 MG tablet    Sig: Take 1 tablet (7.5 mg total) by mouth at bedtime.    Dispense:  30 tablet    Refill:  1    Patient will call when she wants this filled     This patient was seen by Drema Dallas, PA-C in collaboration with Dr. Clayborn Bigness as a part of collaborative care agreement.   Total time spent:30 Minutes Time spent includes review of chart, medications, test results, and follow up plan with the patient.      Dr Lavera Guise Internal medicine

## 2021-02-02 ENCOUNTER — Other Ambulatory Visit: Payer: Self-pay | Admitting: Nurse Practitioner

## 2021-02-02 DIAGNOSIS — N959 Unspecified menopausal and perimenopausal disorder: Secondary | ICD-10-CM

## 2021-02-03 ENCOUNTER — Ambulatory Visit
Admission: RE | Admit: 2021-02-03 | Discharge: 2021-02-03 | Disposition: A | Discharge status: Home / Self Care | Payer: Medicare HMO | Source: Ambulatory Visit | Attending: Physician Assistant | Admitting: Physician Assistant

## 2021-02-03 ENCOUNTER — Other Ambulatory Visit: Payer: Self-pay

## 2021-02-03 DIAGNOSIS — D171 Benign lipomatous neoplasm of skin and subcutaneous tissue of trunk: Secondary | ICD-10-CM | POA: Insufficient documentation

## 2021-02-03 DIAGNOSIS — R1032 Left lower quadrant pain: Secondary | ICD-10-CM | POA: Diagnosis not present

## 2021-02-05 ENCOUNTER — Other Ambulatory Visit: Payer: Self-pay | Admitting: Nurse Practitioner

## 2021-02-05 ENCOUNTER — Telehealth: Payer: Self-pay

## 2021-02-05 DIAGNOSIS — N959 Unspecified menopausal and perimenopausal disorder: Secondary | ICD-10-CM

## 2021-02-05 NOTE — Telephone Encounter (Signed)
-----   Message from Mylinda Latina, PA-C sent at 02/05/2021  2:16 PM EDT ----- Please let pt know that her [place] was normal

## 2021-02-05 NOTE — Telephone Encounter (Signed)
Pt informed of normal [place] results

## 2021-02-08 ENCOUNTER — Other Ambulatory Visit: Payer: Self-pay | Admitting: Nurse Practitioner

## 2021-02-08 DIAGNOSIS — N959 Unspecified menopausal and perimenopausal disorder: Secondary | ICD-10-CM

## 2021-02-10 ENCOUNTER — Other Ambulatory Visit: Payer: Self-pay

## 2021-02-10 ENCOUNTER — Other Ambulatory Visit: Payer: Self-pay | Admitting: Nurse Practitioner

## 2021-02-10 ENCOUNTER — Telehealth: Payer: Self-pay

## 2021-02-10 DIAGNOSIS — N959 Unspecified menopausal and perimenopausal disorder: Secondary | ICD-10-CM

## 2021-02-10 MED ORDER — ESTRADIOL 0.5 MG PO TABS: 0.5000 mg | ORAL_TABLET | Freq: Every day | ORAL | 3 refills | Status: DC

## 2021-02-10 NOTE — Telephone Encounter (Signed)
Pt advised that we send estradiol pres to phar

## 2021-03-04 ENCOUNTER — Other Ambulatory Visit: Payer: Self-pay | Admitting: Physician Assistant

## 2021-03-04 DIAGNOSIS — G4709 Other insomnia: Secondary | ICD-10-CM

## 2021-03-11 DIAGNOSIS — L820 Inflamed seborrheic keratosis: Secondary | ICD-10-CM | POA: Diagnosis not present

## 2021-03-11 DIAGNOSIS — K4090 Unilateral inguinal hernia, without obstruction or gangrene, not specified as recurrent: Secondary | ICD-10-CM | POA: Diagnosis not present

## 2021-03-24 DIAGNOSIS — I493 Ventricular premature depolarization: Secondary | ICD-10-CM | POA: Diagnosis not present

## 2021-03-24 DIAGNOSIS — R002 Palpitations: Secondary | ICD-10-CM | POA: Diagnosis not present

## 2021-03-24 DIAGNOSIS — I10 Essential (primary) hypertension: Secondary | ICD-10-CM | POA: Diagnosis not present

## 2021-03-27 DIAGNOSIS — K4090 Unilateral inguinal hernia, without obstruction or gangrene, not specified as recurrent: Secondary | ICD-10-CM | POA: Diagnosis not present

## 2021-03-28 ENCOUNTER — Other Ambulatory Visit: Payer: Self-pay | Admitting: Surgery

## 2021-03-28 DIAGNOSIS — R1032 Left lower quadrant pain: Secondary | ICD-10-CM

## 2021-04-06 ENCOUNTER — Telehealth: Payer: Self-pay

## 2021-04-06 ENCOUNTER — Other Ambulatory Visit: Payer: Self-pay

## 2021-04-06 MED ORDER — AZITHROMYCIN 250 MG PO TABS: ORAL_TABLET | ORAL | 0 refills | Status: DC

## 2021-04-06 MED ORDER — LEVOFLOXACIN 500 MG PO TABS: 500.0000 mg | ORAL_TABLET | Freq: Every day | ORAL | 0 refills | Status: DC

## 2021-04-06 NOTE — Telephone Encounter (Signed)
Pt called on 04/04/21 and c/o bronchitis, low grade fever, a lot of congestion, and cough.  Requesting abx to be sent to pharmacy.  Per [facility] we sent in Levaquin 500 mg for 10 days.  Called in abx to pt's pharmacy.

## 2021-04-09 DIAGNOSIS — Z03818 Encounter for observation for suspected exposure to other biological agents ruled out: Secondary | ICD-10-CM | POA: Diagnosis not present

## 2021-04-09 DIAGNOSIS — Z20822 Contact with and (suspected) exposure to covid-19: Secondary | ICD-10-CM | POA: Diagnosis not present

## 2021-04-15 ENCOUNTER — Ambulatory Visit (INDEPENDENT_AMBULATORY_CARE_PROVIDER_SITE_OTHER): Payer: Medicare HMO | Admitting: Internal Medicine

## 2021-04-15 ENCOUNTER — Other Ambulatory Visit: Payer: Self-pay

## 2021-04-15 ENCOUNTER — Encounter: Payer: Self-pay | Admitting: Internal Medicine

## 2021-04-15 DIAGNOSIS — E2839 Other primary ovarian failure: Secondary | ICD-10-CM | POA: Diagnosis not present

## 2021-04-15 DIAGNOSIS — J45909 Unspecified asthma, uncomplicated: Secondary | ICD-10-CM

## 2021-04-15 DIAGNOSIS — I10 Essential (primary) hypertension: Secondary | ICD-10-CM | POA: Diagnosis not present

## 2021-04-15 DIAGNOSIS — R0602 Shortness of breath: Secondary | ICD-10-CM | POA: Diagnosis not present

## 2021-04-15 DIAGNOSIS — R300 Dysuria: Secondary | ICD-10-CM | POA: Diagnosis not present

## 2021-04-15 DIAGNOSIS — E039 Hypothyroidism, unspecified: Secondary | ICD-10-CM

## 2021-04-15 DIAGNOSIS — Z0001 Encounter for general adult medical examination with abnormal findings: Secondary | ICD-10-CM

## 2021-04-15 DIAGNOSIS — Z1239 Encounter for other screening for malignant neoplasm of breast: Secondary | ICD-10-CM | POA: Diagnosis not present

## 2021-04-15 MED ORDER — IPRATROPIUM-ALBUTEROL 0.5-2.5 (3) MG/3ML IN SOLN
3.0000 mL | Freq: Once | RESPIRATORY_TRACT | Status: AC
Start: 2021-04-15 — End: 2021-04-15
  Administered 2021-04-15: 3 mL via RESPIRATORY_TRACT

## 2021-04-15 NOTE — Progress Notes (Signed)
[facility] [place], [place] [address]  Internal MEDICINE  Office Visit Note  Patient Name: Amanda Livingston  [id number]  [id number]  Date of Service: 04/15/2021  Chief Complaint  Patient presents with   Medicare Wellness   Asthma   Hypertension     HPI Pt is here for routine health maintenance examination She feels well, recent episode of sickness with diarrhea, fever, chills and coughing as well She was given Zithromax, feeling better , slight sob  She has a lump on left of upper abdomen. Scheduled to have CT   Current Medication: Outpatient Encounter Medications as of 04/15/2021  Medication Sig   azithromycin (ZITHROMAX) 250 MG tablet Take 1 tablet by mouth daily for 10 days   Biotin 1000 MCG tablet Take 1,000 mcg by mouth daily.   Cholecalciferol (VITAMIN D3) 1000 units CAPS Take 1,000 Units by mouth every other day.    cloNIDine (CATAPRES) 0.1 MG tablet Take 1 tablet (0.1 mg total) by mouth 2 (two) times daily as needed.   dimenhyDRINATE (DRAMAMINE) 50 MG tablet Take 25 mg by mouth at bedtime.   estradiol (ESTRACE) 0.5 MG tablet Take 1 tablet (0.5 mg total) by mouth daily.   levothyroxine (SYNTHROID) 137 MCG tablet Take 137 mcg by mouth daily before breakfast.    losartan (COZAAR) 25 MG tablet Take 1 tablet (25 mg total) by mouth 2 (two) times daily.   melatonin 3 MG TABS tablet Take 3 mg by mouth at bedtime.   metoprolol succinate (TOPROL-XL) 25 MG 24 hr tablet Take 12.5 mg by mouth daily.    oxybutynin (DITROPAN) 5 MG tablet Take 5 mg by mouth every 8 (eight) hours as needed for bladder spasms.   tretinoin (RETIN-A) 0.025 % cream Apply 1 application topically daily as needed.   vitamin C (ASCORBIC ACID) 500 MG tablet Take 500 mg by mouth daily.   [DISCONTINUED] mirtazapine (REMERON) 7.5 MG tablet TAKE 1 TABLET BY MOUTH AT BEDTIME. (Patient not taking: Reported on 04/15/2021)   No facility-administered encounter medications on file as of  04/15/2021.    Surgical History: Past Surgical History:  Procedure Laterality Date   ABDOMINAL HYSTERECTOMY     colonscopy  2015   CYSTOSCOPY W/ RETROGRADES Bilateral 10/31/2018   Procedure: CYSTOSCOPY WITH RETROGRADE PYELOGRAM;  Surgeon: Hollice Espy, MD;  Location: [facility];  Service: Urology;  Laterality: Bilateral;   CYSTOSCOPY WITH BIOPSY N/A 10/31/2018   Procedure: CYSTOSCOPY WITH Bladder BIOPSY;  Surgeon: Hollice Espy, MD;  Location: [facility];  Service: Urology;  Laterality: N/A;   ESOPHAGOGASTRODUODENOSCOPY (EGD) WITH PROPOFOL N/A 09/30/2017   Procedure: ESOPHAGOGASTRODUODENOSCOPY (EGD) WITH PROPOFOL;  Surgeon: Virgel Manifold, MD;  Location: [facility];  Service: Endoscopy;  Laterality: N/A;   NEPHRECTOMY Left    THYROIDECTOMY     TONSILLECTOMY Right     Medical History: Past Medical History:  Diagnosis Date   Arthritis    Complication of anesthesia    Dysrhythmia    Family history of adverse reaction to anesthesia    PONV   Hypertension    Hypothyroidism    Osteopenia    PONV (postoperative nausea and vomiting)    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[place])    thyroid    Family History: Family History  Problem Relation Age of Onset   Cancer Father     Social History: Social History   Socioeconomic History   Marital status: Divorced    Spouse name: Not on file   Number of children: Not  on file   Years of education: Not on file   Highest education level: Not on file  Occupational History   Not on file  Tobacco Use   Smoking status: Former    Packs/day: 0.25    Types: Cigarettes   Smokeless tobacco: Never   Tobacco comments:    3 or 4 cigarettes a day due to stress, trying to quit  Vaping Use   Vaping Use: Never used  Substance and Sexual Activity   Alcohol use: Yes    Alcohol/week: 7.0 standard drinks    Types: 7 Glasses of wine per week    Comment: half a glass full a few time a week   Drug use: No   Sexual activity: Not on file  Other Topics Concern    Not on file  Social History Narrative   Not on file   Social Determinants of Health   Financial Resource Strain: Not on file  Food Insecurity: Not on file  Transportation Needs: Not on file  Physical Activity: Not on file  Stress: Not on file  Social Connections: Not on file      Review of Systems  Constitutional:  Negative for chills, fatigue and unexpected weight change.  HENT:  Positive for postnasal drip. Negative for congestion, rhinorrhea, sneezing and sore throat.   Eyes:  Negative for redness.  Respiratory:  Positive for cough. Negative for chest tightness and shortness of breath.   Cardiovascular:  Negative for chest pain and palpitations.  Gastrointestinal:  Negative for abdominal pain, constipation, diarrhea, nausea and vomiting.  Genitourinary:  Negative for dysuria and frequency.  Musculoskeletal:  Negative for arthralgias, back pain, joint swelling and neck pain.  Skin:  Negative for rash.  Neurological: Negative.  Negative for tremors and numbness.  Hematological:  Negative for adenopathy. Does not bruise/bleed easily.  Psychiatric/Behavioral:  Negative for behavioral problems (Depression), sleep disturbance and suicidal ideas. The patient is not nervous/anxious.     Vital Signs: BP (!) 144/76   Pulse 71   Temp 98.1 F (36.7 C)   Resp 16   Ht 5' 3.5" (1.613 m)   Wt 135 lb 9.6 oz (61.5 kg)   SpO2 97%   BMI 23.64 kg/m    Physical Exam Constitutional:      General: She is not in acute distress.    Appearance: She is well-developed. She is not diaphoretic.  HENT:     Head: Normocephalic and atraumatic.     Right Ear: External ear normal.     Left Ear: External ear normal.     Nose: Nose normal.     Mouth/Throat:     Pharynx: No oropharyngeal exudate.  Eyes:     General: No scleral icterus.       Right eye: No discharge.        Left eye: No discharge.     Conjunctiva/sclera: Conjunctivae normal.     Pupils: Pupils are equal, round, and reactive  to light.  Neck:     Thyroid: No thyromegaly.     Vascular: No JVD.     Trachea: No tracheal deviation.  Cardiovascular:     Rate and Rhythm: Normal rate and regular rhythm.     Heart sounds: Normal heart sounds. No murmur heard.   No friction rub. No gallop.  Pulmonary:     Effort: Pulmonary effort is normal. No respiratory distress.     Breath sounds: Normal breath sounds. No stridor. No wheezing or rales.  Chest:  Chest wall: No tenderness.  Abdominal:     General: Bowel sounds are normal. There is no distension.     Palpations: Abdomen is soft. There is no mass.     Tenderness: There is no abdominal tenderness. There is no guarding or rebound.  Musculoskeletal:        General: No tenderness or deformity. Normal range of motion.     Cervical back: Normal range of motion and neck supple.  Lymphadenopathy:     Cervical: No cervical adenopathy.  Skin:    General: Skin is warm and dry.     Coloration: Skin is not pale.     Findings: No erythema or rash.  Neurological:     Mental Status: She is alert.     Cranial Nerves: No cranial nerve deficit.     Motor: No abnormal muscle tone.     Coordination: Coordination normal.     Deep Tendon Reflexes: Reflexes are normal and symmetric.  Psychiatric:        Behavior: Behavior normal.        Thought Content: Thought content normal.        Judgment: Judgment normal.   Assessment/Plan: 1. Encounter for general adult medical examination with abnormal findings Patient is up-to-date on her preventive health maintenance - CBC with Differential/Platelet; Future - Lipid Panel With LDL/HDL Ratio; Future - TSH; Future - T4, free; Future - Comprehensive metabolic panel  2. Hypothyroidism, unspecified type Continue Synthroid as before does have history of thyroid cancer and is monitored by endocrinology - CBC with Differential/Platelet; Future - Lipid Panel With LDL/HDL Ratio; Future - TSH; Future - T4, free; Future - Comprehensive  metabolic panel  3. Acute asthmatic bronchitis  Patient continues to have shortness of breath with cough was given azithromycin however has wheezing as well spirometry was done which was abnormal a sample of Anoro is also given to the patient - Spirometry with Graph - ipratropium-albuterol (DUONEB) 0.5-2.5 (3) MG/3ML nebulizer solution 3 mL  4. Benign hypertension Blood pressure is well controlled  5. Breast screening Mammogram is ordered - MM 3D SCREEN BREAST BILATERAL; Future - CBC with Differential/Platelet; Future - Lipid Panel With LDL/HDL Ratio; Future - TSH; Future - T4, free; Future - Comprehensive metabolic panel - MM 3D SCREEN BREAST BILATERAL  6. Ovarian failure - CBC with Differential/Platelet; Future - Lipid Panel With LDL/HDL Ratio; Future - TSH; Future - T4, free; Future - Comprehensive metabolic panel  7. Dysuria - UA/M w/rflx Culture, Routine - CBC with Differential/Platelet; Future - Lipid Panel With LDL/HDL Ratio; Future - TSH; Future - T4, free; Future - Comprehensive metabolic panel   General Counseling: juvia aerts understanding of the findings of todays visit and agrees with plan of treatment. I have discussed any further diagnostic evaluation that may be needed or ordered today. We also reviewed her medications today. she has been encouraged to call the office with any questions or concerns that should arise related to todays visit.    Counseling:  [place] Controlled Substance Database was reviewed by me.  Orders Placed This Encounter  Procedures   MM 3D SCREEN BREAST BILATERAL   UA/M w/rflx Culture, Routine   CBC with Differential/Platelet   Lipid Panel With LDL/HDL Ratio   TSH   T4, free   Comprehensive metabolic panel    No orders of the defined types were placed in this encounter.   Total time spent:35 Minutes  Time spent includes review of chart, medications, test results, and follow up  plan with the patient.     Lavera Guise, MD  Internal Medicine

## 2021-04-16 LAB — UA/M W/RFLX CULTURE, ROUTINE
Bilirubin, UA: NEGATIVE
Glucose, UA: NEGATIVE
Ketones, UA: NEGATIVE
Leukocytes,UA: NEGATIVE
Nitrite, UA: NEGATIVE
Protein,UA: NEGATIVE
RBC, UA: NEGATIVE
Specific Gravity, UA: 1.009 (ref 1.005–1.030)
Urobilinogen, Ur: 0.2 mg/dL (ref 0.2–1.0)
pH, UA: 5.5 (ref 5.0–7.5)

## 2021-04-16 LAB — MICROSCOPIC EXAMINATION
Bacteria, UA: NONE SEEN
Casts: NONE SEEN /lpf
RBC, Urine: NONE SEEN /hpf (ref 0–2)

## 2021-04-21 ENCOUNTER — Ambulatory Visit: Payer: Medicare HMO | Admitting: Internal Medicine

## 2021-04-22 ENCOUNTER — Ambulatory Visit: Payer: Medicare HMO

## 2021-05-02 ENCOUNTER — Other Ambulatory Visit: Payer: Self-pay

## 2021-05-02 DIAGNOSIS — Z0001 Encounter for general adult medical examination with abnormal findings: Secondary | ICD-10-CM | POA: Diagnosis not present

## 2021-05-02 DIAGNOSIS — Z1239 Encounter for other screening for malignant neoplasm of breast: Secondary | ICD-10-CM | POA: Diagnosis not present

## 2021-05-02 DIAGNOSIS — E039 Hypothyroidism, unspecified: Secondary | ICD-10-CM

## 2021-05-02 DIAGNOSIS — E2839 Other primary ovarian failure: Secondary | ICD-10-CM

## 2021-05-02 DIAGNOSIS — R300 Dysuria: Secondary | ICD-10-CM | POA: Diagnosis not present

## 2021-05-03 LAB — CBC WITH DIFFERENTIAL/PLATELET
Basophils Absolute: 0 10*3/uL (ref 0.0–0.2)
Basos: 1 %
EOS (ABSOLUTE): 0.1 10*3/uL (ref 0.0–0.4)
Eos: 3 %
Hematocrit: 37.7 % (ref 34.0–46.6)
Hemoglobin: 12.4 g/dL (ref 11.1–15.9)
Immature Grans (Abs): 0 10*3/uL (ref 0.0–0.1)
Immature Granulocytes: 0 %
Lymphocytes Absolute: 1.6 10*3/uL (ref 0.7–3.1)
Lymphs: 38 %
MCH: 28.6 pg (ref 26.6–33.0)
MCHC: 32.9 g/dL (ref 31.5–35.7)
MCV: 87 fL (ref 79–97)
Monocytes Absolute: 0.6 10*3/uL (ref 0.1–0.9)
Monocytes: 13 %
Neutrophils Absolute: 2 10*3/uL (ref 1.4–7.0)
Neutrophils: 45 %
Platelets: 179 10*3/uL (ref 150–450)
RBC: 4.34 x10E6/uL (ref 3.77–5.28)
RDW: 12.1 % (ref 11.7–15.4)
WBC: 4.3 10*3/uL (ref 3.4–10.8)

## 2021-05-03 LAB — COMPREHENSIVE METABOLIC PANEL
ALT: 19 IU/L (ref 0–32)
AST: 24 IU/L (ref 0–40)
Albumin/Globulin Ratio: 1.8 (ref 1.2–2.2)
Albumin: 4.2 g/dL (ref 3.7–4.7)
Alkaline Phosphatase: 70 IU/L (ref 44–121)
BUN/Creatinine Ratio: 19 (ref 12–28)
BUN: 15 mg/dL (ref 8–27)
Bilirubin Total: 0.5 mg/dL (ref 0.0–1.2)
CO2: 26 mmol/L (ref 20–29)
Calcium: 9.3 mg/dL (ref 8.7–10.3)
Chloride: 99 mmol/L (ref 96–106)
Creatinine, Ser: 0.79 mg/dL (ref 0.57–1.00)
Globulin, Total: 2.3 g/dL (ref 1.5–4.5)
Glucose: 90 mg/dL (ref 70–99)
Potassium: 4.4 mmol/L (ref 3.5–5.2)
Sodium: 136 mmol/L (ref 134–144)
Total Protein: 6.5 g/dL (ref 6.0–8.5)
eGFR: 77 mL/min/{1.73_m2} (ref >59)

## 2021-05-03 LAB — LIPID PANEL WITH LDL/HDL RATIO
Cholesterol, Total: 177 mg/dL (ref 100–199)
HDL: 73 mg/dL (ref >39)
LDL Chol Calc (NIH): 88 mg/dL (ref 0–99)
LDL/HDL Ratio: 1.2 ratio (ref 0.0–3.2)
Triglycerides: 91 mg/dL (ref 0–149)
VLDL Cholesterol Cal: 16 mg/dL (ref 5–40)

## 2021-05-07 ENCOUNTER — Ambulatory Visit (INDEPENDENT_AMBULATORY_CARE_PROVIDER_SITE_OTHER): Payer: Medicare HMO | Admitting: Gastroenterology

## 2021-05-07 ENCOUNTER — Encounter: Payer: Self-pay | Admitting: Gastroenterology

## 2021-05-07 ENCOUNTER — Other Ambulatory Visit: Payer: Self-pay

## 2021-05-07 VITALS — BP 149/79 | HR 75 | Temp 97.8°F | Ht 63.5 in | Wt 137.0 lb

## 2021-05-07 DIAGNOSIS — R197 Diarrhea, unspecified: Secondary | ICD-10-CM | POA: Diagnosis not present

## 2021-05-07 NOTE — Progress Notes (Signed)
Primary Care Physician: Lavera Guise, MD  Primary Gastroenterologist:  Dr. Lucilla Lame  Chief Complaint  Patient presents with   Change in bowel habits    Alternating constipation/diarrhea    HPI: Amanda Livingston is a 75 y.o. 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 Prior to that the patient was seen a few months prior for constipation with review of EGD results that were reported to show esophagitis. The patient's previous endoscopic history is:  EGD May 2019 with grade a esophagitis with biopsies at the GE junction reporting changes of reflux.  Nonspecific gastritis noticed in the stomach biopsies   EGD February 2015 for heartburn, showed grade a esophagitis, medium hiatal hernia, gastric erythema, biopsies were done.  Which showed reactive foveolar hyperplasia, and mild superficial vascular congestion.  Negative for H. pylori. Last colonoscopy, February 2015 for screening  with a 5 mm descending colon polyp removed, and internal hemorrhoids.  Biopsy showed hyperplastic polyp.  Repeat was recommended in 10 years if biopsies show hyperplastic polyp.  She had levaquin and Zithromax and has had diarrhea since thanksgiving. She has diarrhea twice a day and it does not wake her up at night.  Past Medical History:  Diagnosis Date   Arthritis    Complication of anesthesia    Dysrhythmia    Family history of adverse reaction to anesthesia    PONV   Hypertension    Hypothyroidism    Osteopenia    PONV (postoperative nausea and vomiting)    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[facility])    thyroid    Current Outpatient Medications  Medication Sig Dispense Refill   Biotin 1000 MCG tablet Take 1,000 mcg by mouth daily.     Cholecalciferol (VITAMIN D3) 1000 units CAPS Take 1,000 Units by mouth every other day.      cloNIDine (CATAPRES) 0.1 MG tablet Take 1 tablet (0.1 mg total) by mouth 2 (two) times daily as needed. 30 tablet 0    dimenhyDRINATE (DRAMAMINE) 50 MG tablet Take 25 mg by mouth at bedtime.     estradiol (ESTRACE) 0.5 MG tablet Take 1 tablet (0.5 mg total) by mouth daily. 90 tablet 3   levothyroxine (SYNTHROID) 137 MCG tablet Take 137 mcg by mouth daily before breakfast.      losartan (COZAAR) 25 MG tablet Take 1 tablet (25 mg total) by mouth 2 (two) times daily. 180 tablet 4   melatonin 3 MG TABS tablet Take 3 mg by mouth at bedtime.     metoprolol succinate (TOPROL-XL) 25 MG 24 hr tablet Take 12.5 mg by mouth daily.      tretinoin (RETIN-A) 0.025 % cream Apply 1 application topically daily as needed. 20 g 6   vitamin C (ASCORBIC ACID) 500 MG tablet Take 500 mg by mouth daily.     No current facility-administered medications for this visit.    Allergies as of 05/07/2021 - Review Complete 05/07/2021  Allergen Reaction Noted   Levaquin [levofloxacin]  04/06/2021   Nitrofurantoin Rash 10/05/2014    ROS:  General: Negative for anorexia, weight loss, fever, chills, fatigue, weakness. ENT: Negative for hoarseness, difficulty swallowing , nasal congestion. CV: Negative for chest pain, angina, palpitations, dyspnea on exertion, peripheral edema.  Respiratory: Negative for dyspnea at rest, dyspnea on exertion, cough, sputum, wheezing.  GI: See history of present illness. GU:  Negative for dysuria, hematuria, urinary incontinence, urinary frequency, nocturnal  urination.  Endo: Negative for unusual weight change.    Physical Examination:   BP (!) 149/79 (BP Location: Left Arm, Patient Position: Sitting, Cuff Size: Normal)    Pulse 75    Temp 97.8 F (36.6 C) (Temporal)    Ht 5' 3.5" (1.613 m)    Wt 137 lb (62.1 kg)    BMI 23.89 kg/m   General: Well-nourished, well-developed in no acute distress.  Eyes: No icterus. Conjunctivae pink. Lungs: Clear to auscultation bilaterally. Non-labored. Heart: Regular rate and rhythm, no murmurs rubs or gallops.  Abdomen: Bowel sounds are normal, nontender,  nondistended, no hepatosplenomegaly or masses, no abdominal bruit. no rebound or guarding.  There was a abdominal wall hernia in the left mid abdomen Extremities: No lower extremity edema. No clubbing or deformities. Neuro: Alert and oriented x 3.  Grossly intact. Skin: Warm and dry, no jaundice.   Psych: Alert and cooperative, normal mood and affect.  Labs:    Imaging Studies: No results found.  Assessment and Plan:   Amanda Livingston is a 75 y.o. y/o female who comes in today with diarrhea that has continued since she had been given antibiotics back in Thanksgiving.  The patient is usually having constipation but now is having diarrhea.  The patient will have her stools sent off for possible pathogens.  The patient has also been told that she can have a change in bowel habits due to the antibiotics causing a change in her bowel flora and may need to be on probiotics if her symptoms do not improve.  She has also been told to avoid milk and dairy products to see if that helps her symptoms.  The patient will contact me if she continues to have diarrhea or and I will contact her if the stool test show anything positive.     Lucilla Lame, MD. [name]    Note: This dictation was prepared with Dragon dictation along with smaller phrase technology. Any transcriptional errors that result from this process are unintentional.

## 2021-05-10 LAB — GI PROFILE, STOOL, PCR

## 2021-05-12 ENCOUNTER — Encounter: Payer: Self-pay | Admitting: Gastroenterology

## 2021-05-12 LAB — C DIFFICILE, CYTOTOXIN B

## 2021-05-12 LAB — C DIFFICILE TOXINS A+B W/RFLX: C difficile Toxins A+B, EIA: NEGATIVE

## 2021-05-13 ENCOUNTER — Telehealth: Payer: Self-pay

## 2021-05-13 NOTE — Telephone Encounter (Signed)
Pt reports she is still having loose stools not watery 3 times daily, almost right after eating....  Pt also has questions about the hernia found and discussed at last OV... She would like more information to talk to doctor about at upcoming [place] at [facility]

## 2021-05-13 NOTE — Telephone Encounter (Signed)
Pt reports she was able to view her lab results  She has questions and wanted to know what would be the next steps considering the lab results

## 2021-05-15 ENCOUNTER — Ambulatory Visit: Payer: Medicare HMO | Admitting: Nurse Practitioner

## 2021-05-15 ENCOUNTER — Other Ambulatory Visit: Payer: Self-pay

## 2021-05-15 ENCOUNTER — Ambulatory Visit (INDEPENDENT_AMBULATORY_CARE_PROVIDER_SITE_OTHER): Payer: Medicare HMO | Admitting: Physician Assistant

## 2021-05-15 ENCOUNTER — Encounter: Payer: Self-pay | Admitting: Physician Assistant

## 2021-05-15 DIAGNOSIS — I10 Essential (primary) hypertension: Secondary | ICD-10-CM | POA: Diagnosis not present

## 2021-05-15 DIAGNOSIS — K521 Toxic gastroenteritis and colitis: Secondary | ICD-10-CM

## 2021-05-15 DIAGNOSIS — R300 Dysuria: Secondary | ICD-10-CM | POA: Diagnosis not present

## 2021-05-15 LAB — POCT URINALYSIS DIPSTICK
Bilirubin, UA: NEGATIVE
Blood, UA: NEGATIVE
Glucose, UA: NEGATIVE
Ketones, UA: NEGATIVE
Leukocytes, UA: NEGATIVE
Nitrite, UA: NEGATIVE
Protein, UA: NEGATIVE
Spec Grav, UA: 1.01 (ref 1.010–1.025)
Urobilinogen, UA: NEGATIVE E.U./dL — AB
pH, UA: 6 (ref 5.0–8.0)

## 2021-05-15 NOTE — Progress Notes (Signed)
[facility] [place], [place] [address]  Internal MEDICINE  Office Visit Note  Patient Name: Amanda Livingston  [id number]  [id number]  Date of Service: 05/16/2021  Chief Complaint  Patient presents with   Urinary Tract Infection    Frequent urination, some burning     HPI Pt is here for a sick visit. -was having some increased urgency and burning yesterday. Wanted to make sure she wasn't getting a UTI. Her dip in office was normal, but will send for culture anyway and start med if indicated. Discussed holding off on any treatment due to reaction from recent ABX use. -Having diarrhea since being on ABX a month ago, did see Dr. Allen Norris for this and was told to avoid milk and will do lab work up and monitor -Was also told the area on abdomen was almost certainly a hernia despite normal [place] and was going to have a CT to confirm, but avoided this due to contrast with having only one kidney. Ended up not having CT scan and will re-discuss with surgeon in a few weeks.  The protrusion is not causing her significant problems currently, but she does not like seeing/feeling it there. -Hands hurt a lot and concern for osteoarthritis. Considering seeing ortho. Discussed trying tylenol to help alleviate pain.  Current Medication:  Outpatient Encounter Medications as of 05/15/2021  Medication Sig   Biotin 1000 MCG tablet Take 1,000 mcg by mouth daily.   Cholecalciferol (VITAMIN D3) 1000 units CAPS Take 1,000 Units by mouth every other day.    cloNIDine (CATAPRES) 0.1 MG tablet Take 1 tablet (0.1 mg total) by mouth 2 (two) times daily as needed.   dimenhyDRINATE (DRAMAMINE) 50 MG tablet Take 25 mg by mouth at bedtime.   estradiol (ESTRACE) 0.5 MG tablet Take 1 tablet (0.5 mg total) by mouth daily.   levothyroxine (SYNTHROID) 137 MCG tablet Take 137 mcg by mouth daily before breakfast.    losartan (COZAAR) 25 MG tablet Take 1 tablet (25 mg total) by mouth 2 (two) times daily.    melatonin 3 MG TABS tablet Take 3 mg by mouth at bedtime.   metoprolol succinate (TOPROL-XL) 25 MG 24 hr tablet Take 12.5 mg by mouth daily.    tretinoin (RETIN-A) 0.025 % cream Apply 1 application topically daily as needed.   vitamin C (ASCORBIC ACID) 500 MG tablet Take 500 mg by mouth daily.   No facility-administered encounter medications on file as of 05/15/2021.      Medical History: Past Medical History:  Diagnosis Date   Arthritis    Complication of anesthesia    Dysrhythmia    Family history of adverse reaction to anesthesia    PONV   Hypertension    Hypothyroidism    Osteopenia    PONV (postoperative nausea and vomiting)    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[facility])    thyroid     Vital Signs: BP 126/63    Pulse 68    Temp 98.3 F (36.8 C)    Resp 16    Ht 5' 3.5" (1.613 m)    Wt 138 lb (62.6 kg)    SpO2 97%    BMI 24.06 kg/m    Review of Systems  Constitutional:  Negative for chills, fatigue and unexpected weight change.  HENT:  Negative for congestion, rhinorrhea, sneezing and sore throat.   Eyes:  Negative for redness.  Respiratory:  Negative for cough, chest tightness and shortness of breath.   Cardiovascular:  Negative for  chest pain and palpitations.  Gastrointestinal:  Positive for diarrhea. Negative for abdominal pain, constipation, nausea and vomiting.  Genitourinary:  Positive for dysuria and urgency. Negative for frequency.  Musculoskeletal:  Negative for arthralgias, back pain, joint swelling and neck pain.  Skin:  Negative for rash.  Neurological: Negative.  Negative for tremors and numbness.  Hematological:  Negative for adenopathy. Does not bruise/bleed easily.  Psychiatric/Behavioral:  Negative for behavioral problems (Depression), sleep disturbance and suicidal ideas. The patient is not nervous/anxious.    Physical Exam Vitals and nursing note reviewed.  Constitutional:      General: She is not in acute distress.    Appearance: She is well-developed and normal  weight. She is not diaphoretic.  HENT:     Head: Normocephalic and atraumatic.     Mouth/Throat:     Pharynx: No oropharyngeal exudate.  Eyes:     Pupils: Pupils are equal, round, and reactive to light.  Neck:     Thyroid: No thyromegaly.     Vascular: No JVD.     Trachea: No tracheal deviation.  Cardiovascular:     Rate and Rhythm: Normal rate and regular rhythm.     Heart sounds: Normal heart sounds. No murmur heard.   No friction rub. No gallop.  Pulmonary:     Effort: Pulmonary effort is normal. No respiratory distress.     Breath sounds: No wheezing or rales.  Chest:     Chest wall: No tenderness.  Abdominal:     General: Bowel sounds are normal.     Palpations: Abdomen is soft.     Tenderness: There is no abdominal tenderness. There is no right CVA tenderness or left CVA tenderness.  Musculoskeletal:        General: Normal range of motion.     Cervical back: Normal range of motion and neck supple.  Lymphadenopathy:     Cervical: No cervical adenopathy.  Skin:    General: Skin is warm and dry.  Neurological:     Mental Status: She is alert and oriented to person, place, and time.     Cranial Nerves: No cranial nerve deficit.  Psychiatric:        Behavior: Behavior normal.        Thought Content: Thought content normal.        Judgment: Judgment normal.      Assessment/Plan: 1. Benign hypertension Stable, continue current medications  2. Diarrhea due to drug Followed by GI, likely secondary to ABX use and is currently avoiding milk  3. Dysuria - POCT Urinalysis Dipstick is negative, but will still send culture due to symptoms. Will hold off on any treatment due to current diarrhea from prior ABX use. Will stay well hydrated. - CULTURE, URINE COMPREHENSIVE   General Counseling: hadyn azer understanding of the findings of todays visit and agrees with plan of treatment. I have discussed any further diagnostic evaluation that may be needed or ordered  today. We also reviewed her medications today. she has been encouraged to call the office with any questions or concerns that should arise related to todays visit.    Counseling:    Orders Placed This Encounter  Procedures   CULTURE, URINE COMPREHENSIVE   POCT Urinalysis Dipstick    No orders of the defined types were placed in this encounter.   Time spent:30 Minutes

## 2021-05-20 DIAGNOSIS — R002 Palpitations: Secondary | ICD-10-CM | POA: Diagnosis not present

## 2021-05-20 DIAGNOSIS — I493 Ventricular premature depolarization: Secondary | ICD-10-CM | POA: Diagnosis not present

## 2021-05-20 DIAGNOSIS — I10 Essential (primary) hypertension: Secondary | ICD-10-CM | POA: Diagnosis not present

## 2021-05-20 DIAGNOSIS — I491 Atrial premature depolarization: Secondary | ICD-10-CM | POA: Diagnosis not present

## 2021-05-20 LAB — CULTURE, URINE COMPREHENSIVE

## 2021-05-29 DIAGNOSIS — K4090 Unilateral inguinal hernia, without obstruction or gangrene, not specified as recurrent: Secondary | ICD-10-CM | POA: Diagnosis not present

## 2021-05-30 NOTE — Progress Notes (Signed)
Appointment was cancelled.

## 2021-06-27 ENCOUNTER — Other Ambulatory Visit: Payer: Self-pay

## 2021-06-27 ENCOUNTER — Ambulatory Visit
Admission: RE | Admit: 2021-06-27 | Discharge: 2021-06-27 | Disposition: A | Discharge status: Home / Self Care | Payer: Medicare HMO | Source: Ambulatory Visit | Attending: Surgery | Admitting: Surgery

## 2021-06-27 DIAGNOSIS — R1032 Left lower quadrant pain: Secondary | ICD-10-CM | POA: Insufficient documentation

## 2021-06-27 DIAGNOSIS — I700 Atherosclerosis of aorta: Secondary | ICD-10-CM | POA: Diagnosis not present

## 2021-06-27 DIAGNOSIS — K429 Umbilical hernia without obstruction or gangrene: Secondary | ICD-10-CM | POA: Diagnosis not present

## 2021-06-27 DIAGNOSIS — R1904 Left lower quadrant abdominal swelling, mass and lump: Secondary | ICD-10-CM | POA: Diagnosis not present

## 2021-06-27 DIAGNOSIS — Z905 Acquired absence of kidney: Secondary | ICD-10-CM | POA: Diagnosis not present

## 2021-07-08 ENCOUNTER — Encounter: Payer: Self-pay | Admitting: Internal Medicine

## 2021-07-08 ENCOUNTER — Ambulatory Visit (INDEPENDENT_AMBULATORY_CARE_PROVIDER_SITE_OTHER): Payer: Medicare HMO | Admitting: Internal Medicine

## 2021-07-08 ENCOUNTER — Other Ambulatory Visit: Payer: Self-pay

## 2021-07-08 VITALS — BP 115/70 | HR 68 | Temp 98.0°F | Resp 16 | Ht 63.5 in | Wt 140.0 lb

## 2021-07-08 DIAGNOSIS — E039 Hypothyroidism, unspecified: Secondary | ICD-10-CM

## 2021-07-08 DIAGNOSIS — R0602 Shortness of breath: Secondary | ICD-10-CM | POA: Diagnosis not present

## 2021-07-08 DIAGNOSIS — J4520 Mild intermittent asthma, uncomplicated: Secondary | ICD-10-CM

## 2021-07-08 DIAGNOSIS — R300 Dysuria: Secondary | ICD-10-CM

## 2021-07-08 DIAGNOSIS — M19049 Primary osteoarthritis, unspecified hand: Secondary | ICD-10-CM | POA: Diagnosis not present

## 2021-07-08 DIAGNOSIS — I10 Essential (primary) hypertension: Secondary | ICD-10-CM

## 2021-07-08 DIAGNOSIS — H43812 Vitreous degeneration, left eye: Secondary | ICD-10-CM | POA: Diagnosis not present

## 2021-07-08 LAB — POCT URINALYSIS DIPSTICK
Bilirubin, UA: NEGATIVE
Blood, UA: NEGATIVE
Glucose, UA: NEGATIVE
Ketones, UA: NEGATIVE
Leukocytes, UA: NEGATIVE
Nitrite, UA: NEGATIVE
Protein, UA: NEGATIVE
Spec Grav, UA: 1.015 (ref 1.010–1.025)
Urobilinogen, UA: 0.2 E.U./dL
pH, UA: 6 (ref 5.0–8.0)

## 2021-07-08 NOTE — Progress Notes (Signed)
[facility] [place], [place] [address]  Internal MEDICINE  Office Visit Note  Patient Name: Amanda Livingston  [id number]  [id number]  Date of Service: 07/21/2021  Chief Complaint  Patient presents with   Follow-up    12 weeks    HPI Pt is here for routine follow up  C/O arthritic pain in her hands. Will like to see ortho.  She has tried over the counter Voltaren gel with no relief Sob and cough is resolved for now, every time patient is sick with upper respiratory tract infection if she develops acute estimated bronchitis and at times she has required bronchodilators and steroids. The patient also had episode of UTI which is resolved she is denies any symptoms of dysuria. Blood pressure is well controlled with medications. Patient also history of thyroidectomy and is followed by endocrinology for hypothyroidism  Current Medication: Outpatient Encounter Medications as of 07/08/2021  Medication Sig   Biotin 1000 MCG tablet Take 1,000 mcg by mouth daily.   Cholecalciferol (VITAMIN D3) 1000 units CAPS Take 1,000 Units by mouth every other day.    cloNIDine (CATAPRES) 0.1 MG tablet Take 1 tablet (0.1 mg total) by mouth 2 (two) times daily as needed.   dimenhyDRINATE (DRAMAMINE) 50 MG tablet Take 25 mg by mouth at bedtime.   estradiol (ESTRACE) 0.5 MG tablet Take 1 tablet (0.5 mg total) by mouth daily.   levothyroxine (SYNTHROID) 137 MCG tablet Take 137 mcg by mouth daily before breakfast.    losartan (COZAAR) 25 MG tablet Take 1 tablet (25 mg total) by mouth 2 (two) times daily.   melatonin 3 MG TABS tablet Take 3 mg by mouth at bedtime.   metoprolol succinate (TOPROL-XL) 25 MG 24 hr tablet Take 12.5 mg by mouth daily.    tretinoin (RETIN-A) 0.025 % cream Apply 1 application topically daily as needed.   vitamin C (ASCORBIC ACID) 500 MG tablet Take 500 mg by mouth daily.   No facility-administered encounter medications on file as of 07/08/2021.    Surgical  History: Past Surgical History:  Procedure Laterality Date   ABDOMINAL HYSTERECTOMY     colonscopy  2015   CYSTOSCOPY W/ RETROGRADES Bilateral 10/31/2018   Procedure: CYSTOSCOPY WITH RETROGRADE PYELOGRAM;  Surgeon: Hollice Espy, MD;  Location: [facility] ORS;  Service: Urology;  Laterality: Bilateral;   CYSTOSCOPY WITH BIOPSY N/A 10/31/2018   Procedure: CYSTOSCOPY WITH Bladder BIOPSY;  Surgeon: Hollice Espy, MD;  Location: [facility] ORS;  Service: Urology;  Laterality: N/A;   ESOPHAGOGASTRODUODENOSCOPY (EGD) WITH PROPOFOL N/A 09/30/2017   Procedure: ESOPHAGOGASTRODUODENOSCOPY (EGD) WITH PROPOFOL;  Surgeon: Virgel Manifold, MD;  Location: [facility] ENDOSCOPY;  Service: Endoscopy;  Laterality: N/A;   NEPHRECTOMY Left    THYROIDECTOMY     TONSILLECTOMY Right     Medical History: Past Medical History:  Diagnosis Date   Arthritis    Complication of anesthesia    Dysrhythmia    Family history of adverse reaction to anesthesia    PONV   Hypertension    Hypothyroidism    Osteopenia    PONV (postoperative nausea and vomiting)    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[place])    thyroid    Family History: Family History  Problem Relation Age of Onset   Cancer Father     Social History   Socioeconomic History   Marital status: Divorced    Spouse name: Not on file   Number of children: Not on file   Years of education: Not on file  Highest education level: Not on file  Occupational History   Not on file  Tobacco Use   Smoking status: Former    Packs/day: 0.25    Types: Cigarettes   Smokeless tobacco: Never   Tobacco comments:    3 or 4 cigarettes a day due to stress, trying to quit  Vaping Use   Vaping Use: Never used  Substance and Sexual Activity   Alcohol use: Yes    Alcohol/week: 7.0 standard drinks    Types: 7 Glasses of wine per week    Comment: half a glass full a few time a week   Drug use: No   Sexual activity: Not on file  Other Topics Concern   Not on file  Social History Narrative    Not on file   Social Determinants of Health   Financial Resource Strain: Not on file  Food Insecurity: Not on file  Transportation Needs: Not on file  Physical Activity: Not on file  Stress: Not on file  Social Connections: Not on file  Intimate Partner Violence: Not on file      Review of Systems  Constitutional:  Negative for chills, fatigue and unexpected weight change.  HENT:  Positive for postnasal drip. Negative for congestion, rhinorrhea, sneezing and sore throat.   Eyes:  Negative for redness.  Respiratory:  Negative for cough, chest tightness and shortness of breath.   Cardiovascular:  Negative for chest pain and palpitations.  Gastrointestinal:  Negative for abdominal pain, constipation, diarrhea, nausea and vomiting.  Genitourinary:  Negative for dysuria and frequency.  Musculoskeletal:  Positive for arthralgias and joint swelling. Negative for back pain and neck pain.  Skin:  Negative for rash.  Neurological: Negative.  Negative for tremors and numbness.  Hematological:  Negative for adenopathy. Does not bruise/bleed easily.  Psychiatric/Behavioral:  Negative for behavioral problems (Depression), sleep disturbance and suicidal ideas. The patient is not nervous/anxious.    Vital Signs: BP 115/70 Comment: 152/74   Pulse 68    Temp 98 F (36.7 C)    Resp 16    Ht 5' 3.5" (1.613 m)    Wt 140 lb (63.5 kg)    SpO2 95%    BMI 24.41 kg/m    Physical Exam Constitutional:      Appearance: Normal appearance.  HENT:     Head: Normocephalic and atraumatic.     Nose: Nose normal.     Mouth/Throat:     Mouth: Mucous membranes are moist.     Pharynx: No posterior oropharyngeal erythema.  Eyes:     Extraocular Movements: Extraocular movements intact.     Pupils: Pupils are equal, round, and reactive to light.  Cardiovascular:     Pulses: Normal pulses.     Heart sounds: Normal heart sounds.  Pulmonary:     Effort: Pulmonary effort is normal.     Breath sounds: Normal  breath sounds.  Musculoskeletal:        General: Tenderness present.  Neurological:     General: No focal deficit present.     Mental Status: She is alert.  Psychiatric:        Mood and Affect: Mood normal.        Behavior: Behavior normal.       Assessment/Plan: 1. Mild intermittent asthmatic bronchitis without complication Resolved at this point her spirometry is normal we will continue to monitor patient is instructed to call the office if she has any acute problems - Spirometry with Graph  2.  Hand arthritis Patient will need to see Ortho for ongoing arthritic pain in her hands - Ambulatory referral to Orthopedic Surgery  3. Hypothyroidism, unspecified type Continue Synthroid as before  4. Essential hypertension, benign Blood pressure is well controlled with medications we will continue  5. Dysuria Urine dipstick is negative - POCT Urinalysis Dipstick   General Counseling: carissa musick understanding of the findings of todays visit and agrees with plan of treatment. I have discussed any further diagnostic evaluation that may be needed or ordered today. We also reviewed her medications today. she has been encouraged to call the office with any questions or concerns that should arise related to todays visit.    Orders Placed This Encounter  Procedures   Ambulatory referral to Orthopedic Surgery   POCT Urinalysis Dipstick   Spirometry with Graph    No orders of the defined types were placed in this encounter.   Total time spent:30 Minutes Time spent includes review of chart, medications, test results, and follow up plan with the patient.   [place] Controlled Substance Database was reviewed by me.   Dr Lavera Guise Internal medicine

## 2021-07-09 ENCOUNTER — Telehealth: Payer: Self-pay

## 2021-07-09 NOTE — Telephone Encounter (Signed)
Awaiting 07/08/21 office notes for Ortho surgery referral-[name] ?

## 2021-07-17 DIAGNOSIS — C73 Malignant neoplasm of thyroid gland: Secondary | ICD-10-CM | POA: Diagnosis not present

## 2021-07-17 DIAGNOSIS — E890 Postprocedural hypothyroidism: Secondary | ICD-10-CM | POA: Diagnosis not present

## 2021-07-17 DIAGNOSIS — E559 Vitamin D deficiency, unspecified: Secondary | ICD-10-CM | POA: Diagnosis not present

## 2021-07-22 ENCOUNTER — Other Ambulatory Visit: Payer: Self-pay | Admitting: Internal Medicine

## 2021-07-22 DIAGNOSIS — Z1231 Encounter for screening mammogram for malignant neoplasm of breast: Secondary | ICD-10-CM

## 2021-07-28 DIAGNOSIS — H2511 Age-related nuclear cataract, right eye: Secondary | ICD-10-CM | POA: Diagnosis not present

## 2021-07-29 ENCOUNTER — Encounter: Payer: Self-pay | Admitting: Nurse Practitioner

## 2021-07-29 ENCOUNTER — Ambulatory Visit (INDEPENDENT_AMBULATORY_CARE_PROVIDER_SITE_OTHER): Payer: Medicare HMO | Admitting: Nurse Practitioner

## 2021-07-29 ENCOUNTER — Other Ambulatory Visit: Payer: Self-pay

## 2021-07-29 VITALS — BP 135/67 | HR 59 | Temp 98.3°F | Resp 16 | Ht 63.5 in | Wt 135.8 lb

## 2021-07-29 DIAGNOSIS — R051 Acute cough: Secondary | ICD-10-CM | POA: Diagnosis not present

## 2021-07-29 DIAGNOSIS — J0110 Acute frontal sinusitis, unspecified: Secondary | ICD-10-CM

## 2021-07-29 MED ORDER — AZITHROMYCIN 250 MG PO TABS: ORAL_TABLET | ORAL | 0 refills | Status: AC

## 2021-07-29 NOTE — Progress Notes (Signed)
[facility] ?[address] ?[place], [place] [address] ? ?Internal MEDICINE  ?Office Visit Note ? ?Patient Name: Amanda Livingston ? [id number]  ?[id number] ? ?Date of Service: 07/29/2021 ? ?Chief Complaint  ?Patient presents with  ? Acute Visit  ?  Congestion for 3 days, no covid test   ? Sinusitis  ? Cough  ? ? ? ?HPI ?Marine presents for an acute sick visit for symptoms of sinusitis.  She did not test herself for COVID prior to her office visit and denies being exposed anyone who may have tested positive for COVID.  She reports having nasal congestion, runny nose, cough, fatigue, sinus pressure, sinus drainage, sore throat, and headache.  She denies any fever, chills, body aches, shortness of breath, wheezing or chest tightness.  Her symptoms started 3 days ago. ? ? ? ? ?Current Medication: ? ?Outpatient Encounter Medications as of 07/29/2021  ?Medication Sig  ? azithromycin (ZITHROMAX) 250 MG tablet Take 2 tablets on day 1, then 1 tablet daily on days 2 through 5  ? Biotin 1000 MCG tablet Take 1,000 mcg by mouth daily.  ? Cholecalciferol (VITAMIN D3) 1000 units CAPS Take 1,000 Units by mouth every other day.   ? cloNIDine (CATAPRES) 0.1 MG tablet Take 1 tablet (0.1 mg total) by mouth 2 (two) times daily as needed.  ? dimenhyDRINATE (DRAMAMINE) 50 MG tablet Take 25 mg by mouth at bedtime.  ? estradiol (ESTRACE) 0.5 MG tablet Take 1 tablet (0.5 mg total) by mouth daily.  ? levothyroxine (SYNTHROID) 137 MCG tablet Take 137 mcg by mouth daily before breakfast.   ? losartan (COZAAR) 25 MG tablet Take 1 tablet (25 mg total) by mouth 2 (two) times daily.  ? melatonin 3 MG TABS tablet Take 3 mg by mouth at bedtime.  ? metoprolol succinate (TOPROL-XL) 25 MG 24 hr tablet Take 12.5 mg by mouth daily.   ? tretinoin (RETIN-A) 0.025 % cream Apply 1 application topically daily as needed.  ? vitamin C (ASCORBIC ACID) 500 MG tablet Take 500 mg by mouth daily.  ? ?No facility-administered encounter medications on file as  of 07/29/2021.  ? ? ? ? ?Medical History: ?Past Medical History:  ?Diagnosis Date  ? Arthritis   ? Complication of anesthesia   ? Dysrhythmia   ? Family history of adverse reaction to anesthesia   ? PONV  ? Hypertension   ? Hypothyroidism   ? Osteopenia   ? PONV (postoperative nausea and vomiting)   ? 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[place])   ? thyroid  ? ? ? ?Vital Signs: ?BP 135/67   Pulse (!) 59   Temp 98.3 ?F (36.8 ?C)   Resp 16   Ht 5' 3.5" (1.613 m)   Wt 135 lb 12.8 oz (61.6 kg)   SpO2 98%   BMI 23.68 kg/m?  ? ? ?Review of Systems  ?Constitutional:  Positive for fatigue. Negative for chills and fever.  ?HENT:  Positive for congestion, postnasal drip, rhinorrhea, sinus pressure, sinus pain and sneezing. Negative for ear pain, sore throat and trouble swallowing.   ?Respiratory:  Positive for cough. Negative for chest tightness, shortness of breath and wheezing.   ?Cardiovascular: Negative.  Negative for chest pain and palpitations.  ?Gastrointestinal:  Negative for abdominal pain, constipation, diarrhea, nausea and vomiting.  ?Musculoskeletal:  Negative for myalgias.  ?Skin:  Negative for rash.  ?Neurological:  Positive for headaches.  ? ?Physical Exam ?Vitals reviewed.  ?Constitutional:   ?   General: She is awake. She  is not in acute distress. ?   Appearance: Normal appearance. She is well-developed, well-groomed and normal weight. She is not ill-appearing.  ?HENT:  ?   Head: Normocephalic and atraumatic.  ?   Right Ear: Ear canal and external ear normal. There is impacted cerumen.  ?   Left Ear: Ear canal and external ear normal. Tympanic membrane is injected.  ?   Nose: Mucosal edema, congestion and rhinorrhea present. Rhinorrhea is clear.  ?   Right Turbinates: Swollen and pale.  ?   Left Turbinates: Swollen and pale.  ?   Right Sinus: Frontal sinus tenderness present.  ?   Left Sinus: Frontal sinus tenderness present.  ?   Mouth/Throat:  ?   Mouth: Mucous membranes are moist.  ?   Pharynx: Posterior oropharyngeal  erythema present. No oropharyngeal exudate.  ?Eyes:  ?   General: Lids are normal. Vision grossly intact. Gaze aligned appropriately.  ?   Pupils: Pupils are equal, round, and reactive to light.  ?Cardiovascular:  ?   Rate and Rhythm: Normal rate and regular rhythm.  ?   Pulses: Normal pulses.  ?   Heart sounds: Normal heart sounds, S1 normal and S2 normal.  ?Pulmonary:  ?   Effort: Pulmonary effort is normal. No accessory muscle usage or respiratory distress.  ?   Breath sounds: Normal breath sounds and air entry. No decreased air movement. No wheezing.  ?Musculoskeletal:  ?   Right lower leg: No edema.  ?   Left lower leg: No edema.  ?Neurological:  ?   Mental Status: She is alert.  ?Psychiatric:     ?   Behavior: Behavior is cooperative.  ? ? ? ? ?Assessment/Plan: ?1. Acute non-recurrent frontal sinusitis ?Empiric antibiotic treatment prescribed. Follow up as needed.  ?- azithromycin (ZITHROMAX) 250 MG tablet; Take 2 tablets on day 1, then 1 tablet daily on days 2 through 5  Dispense: 6 tablet; Refill: 0 ? ?2. Acute cough ?May take desired OTC cough suppressant if needed.  ? ? ?General Counseling: thia olesen understanding of the findings of todays visit and agrees with plan of treatment. I have discussed any further diagnostic evaluation that may be needed or ordered today. We also reviewed her medications today. she has been encouraged to call the office with any questions or concerns that should arise related to todays visit. ? ? ? ?Counseling: ? ? ? ?No orders of the defined types were placed in this encounter. ? ? ?Meds ordered this encounter  ?Medications  ? azithromycin (ZITHROMAX) 250 MG tablet  ?  Sig: Take 2 tablets on day 1, then 1 tablet daily on days 2 through 5  ?  Dispense:  6 tablet  ?  Refill:  0  ? ? ?Return if symptoms worsen or fail to improve. ? ?[place] Controlled Substance Database was reviewed by me for overdose risk score (ORS) ? ?Time spent: 20 Minutes ?Time spent with patient  included reviewing progress notes, labs, imaging studies, and discussing plan for follow up.  ? ?This patient was seen by Jonetta Osgood, FNP-C in collaboration with Dr. Clayborn Bigness as a part of collaborative care agreement. ? ?Jamyron Redd R. Valetta Fuller, MSN, FNP-C ?Internal Medicine ?

## 2021-08-01 ENCOUNTER — Telehealth: Payer: Self-pay

## 2021-08-01 NOTE — Telephone Encounter (Signed)
Per Nira Conn w/ [facility] orth[facility], patient declined appointment-[name] ?

## 2021-08-03 ENCOUNTER — Telehealth: Payer: Self-pay

## 2021-08-03 DIAGNOSIS — L819 Disorder of pigmentation, unspecified: Secondary | ICD-10-CM

## 2021-08-03 MED ORDER — TRETINOIN 0.025 % EX CREA: 1.0000 "application " | TOPICAL_CREAM | Freq: Every day | CUTANEOUS | 6 refills | Status: DC | PRN

## 2021-08-03 NOTE — Telephone Encounter (Signed)
PA for TRETINOIN 0.025 % cream sent on 3.26.23 @ 415 pm  ?PA came back approved 08/03/21 and validm 05/11/21 to 05/10/22 ?

## 2021-08-13 ENCOUNTER — Encounter: Payer: Self-pay | Admitting: Ophthalmology

## 2021-08-18 NOTE — Discharge Instructions (Signed)

## 2021-08-20 ENCOUNTER — Ambulatory Visit
Admission: RE | Admit: 2021-08-20 | Discharge: 2021-08-20 | Disposition: A | Discharge status: Home / Self Care | Payer: Medicare HMO | Attending: Ophthalmology | Admitting: Ophthalmology

## 2021-08-20 ENCOUNTER — Encounter
Admission: RE | Disposition: A | Discharge status: Home / Self Care | Payer: Self-pay | Source: Home / Self Care | Attending: Ophthalmology

## 2021-08-20 ENCOUNTER — Encounter: Payer: Self-pay | Admitting: Ophthalmology

## 2021-08-20 ENCOUNTER — Telehealth: Payer: Self-pay

## 2021-08-20 ENCOUNTER — Other Ambulatory Visit: Payer: Self-pay

## 2021-08-20 ENCOUNTER — Ambulatory Visit: Payer: Medicare HMO | Admitting: Anesthesiology

## 2021-08-20 DIAGNOSIS — Z79890 Hormone replacement therapy (postmenopausal): Secondary | ICD-10-CM | POA: Insufficient documentation

## 2021-08-20 DIAGNOSIS — H25811 Combined forms of age-related cataract, right eye: Secondary | ICD-10-CM | POA: Diagnosis not present

## 2021-08-20 DIAGNOSIS — H2511 Age-related nuclear cataract, right eye: Secondary | ICD-10-CM | POA: Diagnosis not present

## 2021-08-20 DIAGNOSIS — Z905 Acquired absence of kidney: Secondary | ICD-10-CM | POA: Diagnosis not present

## 2021-08-20 DIAGNOSIS — E890 Postprocedural hypothyroidism: Secondary | ICD-10-CM | POA: Diagnosis not present

## 2021-08-20 DIAGNOSIS — Z79899 Other long term (current) drug therapy: Secondary | ICD-10-CM | POA: Insufficient documentation

## 2021-08-20 DIAGNOSIS — Z85850 Personal history of malignant neoplasm of thyroid: Secondary | ICD-10-CM | POA: Diagnosis not present

## 2021-08-20 DIAGNOSIS — I10 Essential (primary) hypertension: Secondary | ICD-10-CM | POA: Diagnosis not present

## 2021-08-20 DIAGNOSIS — Z87891 Personal history of nicotine dependence: Secondary | ICD-10-CM | POA: Insufficient documentation

## 2021-08-20 HISTORY — DX: Acquired absence of kidney: Z90.5

## 2021-08-20 HISTORY — DX: Nonrheumatic mitral (valve) insufficiency: I34.0

## 2021-08-20 HISTORY — PX: CATARACT EXTRACTION W/PHACO: SHX586

## 2021-08-20 SURGERY — PHACOEMULSIFICATION, CATARACT, WITH IOL INSERTION
Anesthesia: Monitor Anesthesia Care | Site: Eye | Laterality: Right

## 2021-08-20 MED ORDER — CEFUROXIME OPHTHALMIC INJECTION 1 MG/0.1 ML
INJECTION | OPHTHALMIC | Status: DC | PRN
  Administered 2021-08-20: 0.1 mL via INTRACAMERAL

## 2021-08-20 MED ORDER — ARMC OPHTHALMIC DILATING DROPS
1.0000 "application " | OPHTHALMIC | Status: DC | PRN
  Administered 2021-08-20 (×3): 1 via OPHTHALMIC

## 2021-08-20 MED ORDER — SIGHTPATH DOSE#1 BSS IO SOLN
INTRAOCULAR | Status: DC | PRN
  Administered 2021-08-20: 56 mL via OPHTHALMIC

## 2021-08-20 MED ORDER — TETRACAINE HCL 0.5 % OP SOLN
1.0000 [drp] | OPHTHALMIC | Status: DC | PRN
  Administered 2021-08-20 (×3): 1 [drp] via OPHTHALMIC

## 2021-08-20 MED ORDER — ACETAMINOPHEN 160 MG/5ML PO SOLN: 325.0000 mg | Freq: Once | ORAL | Status: DC

## 2021-08-20 MED ORDER — FENTANYL CITRATE (PF) 100 MCG/2ML IJ SOLN
INTRAMUSCULAR | Status: DC | PRN
  Administered 2021-08-20: 100 ug via INTRAVENOUS

## 2021-08-20 MED ORDER — ACETAMINOPHEN 325 MG PO TABS: 325.0000 mg | ORAL_TABLET | Freq: Once | ORAL | Status: DC

## 2021-08-20 MED ORDER — SIGHTPATH DOSE#1 NA HYALUR & NA CHOND-NA HYALUR IO KIT
PACK | INTRAOCULAR | Status: DC | PRN
  Administered 2021-08-20: 1 via OPHTHALMIC

## 2021-08-20 MED ORDER — MIDAZOLAM HCL 2 MG/2ML IJ SOLN
INTRAMUSCULAR | Status: DC | PRN
  Administered 2021-08-20: 2 mg via INTRAVENOUS

## 2021-08-20 MED ORDER — SIGHTPATH DOSE#1 BSS IO SOLN
INTRAOCULAR | Status: DC | PRN
Start: 2021-08-20 — End: 2021-08-20
  Administered 2021-08-20: 15 mL

## 2021-08-20 MED ORDER — LACTATED RINGERS IV SOLN: INTRAVENOUS | Status: DC

## 2021-08-20 MED ORDER — BRIMONIDINE TARTRATE-TIMOLOL 0.2-0.5 % OP SOLN
OPHTHALMIC | Status: DC | PRN
  Administered 2021-08-20: 1 [drp] via OPHTHALMIC

## 2021-08-20 MED ORDER — SIGHTPATH DOSE#1 BSS IO SOLN
INTRAOCULAR | Status: DC | PRN
  Administered 2021-08-20: 1 mL via INTRAMUSCULAR

## 2021-08-20 SURGICAL SUPPLY — 11 items
CATARACT SUITE SIGHTPATH (MISCELLANEOUS) ×2 IMPLANT
FEE CATARACT SUITE SIGHTPATH (MISCELLANEOUS) ×1 IMPLANT
GLOVE SRG 8 PF TXTR STRL LF DI (GLOVE) ×1 IMPLANT
GLOVE SURG ENC TEXT LTX SZ7.5 (GLOVE) ×2 IMPLANT
GLOVE SURG UNDER POLY LF SZ8 (GLOVE) ×2
LENS IOL TECNIS EYHANCE 20.5 (Intraocular Lens) ×1 IMPLANT
NDL FILTER BLUNT 18X1 1/2 (NEEDLE) ×1 IMPLANT
NEEDLE FILTER BLUNT 18X 1/2SAF (NEEDLE) ×1
NEEDLE FILTER BLUNT 18X1 1/2 (NEEDLE) ×1 IMPLANT
SYR 3ML LL SCALE MARK (SYRINGE) ×2 IMPLANT
WATER STERILE IRR 250ML POUR (IV SOLUTION) ×2 IMPLANT

## 2021-08-20 NOTE — Transfer of Care (Signed)
Immediate Anesthesia Transfer of Care Note ? ?Patient: Amanda Livingston ? ?Procedure(s) Performed: CATARACT EXTRACTION PHACO AND INTRAOCULAR LENS PLACEMENT (IOC) RIGHT MiLOOP 3.10 00:40.8 (Right: Eye) ? ?Patient Location: PACU ? ?Anesthesia Type: MAC ? ?Level of Consciousness: awake, alert  and patient cooperative ? ?Airway and Oxygen Therapy: Patient Spontanous Breathing and Patient connected to supplemental oxygen ? ?Post-op Assessment: Post-op Vital signs reviewed, Patient's Cardiovascular Status Stable, Respiratory Function Stable, Patent Airway and No signs of Nausea or vomiting ? ?Post-op Vital Signs: Reviewed and stable ? ?Complications: No notable events documented. ? ?

## 2021-08-20 NOTE — Anesthesia Postprocedure Evaluation (Signed)
Anesthesia Post Note ? ?Patient: Amanda Livingston ? ?Procedure(s) Performed: CATARACT EXTRACTION PHACO AND INTRAOCULAR LENS PLACEMENT (IOC) RIGHT MiLOOP 3.10 00:40.8 (Right: Eye) ? ? ?  ?Patient location during evaluation: PACU ?Anesthesia Type: MAC ?Level of consciousness: awake and alert and oriented ?Pain management: satisfactory to patient ?Vital Signs Assessment: post-procedure vital signs reviewed and stable ?Respiratory status: spontaneous breathing, nonlabored ventilation and respiratory function stable ?Cardiovascular status: blood pressure returned to baseline and stable ?Postop Assessment: Adequate PO intake and No signs of nausea or vomiting ?Anesthetic complications: no ? ? ?No notable events documented. ? ?Raliegh Ip ? ? ? ? ? ?

## 2021-08-20 NOTE — H&P (Signed)
?[facility]  ? ?Primary Care Physician:  Lavera Guise, MD ?Ophthalmologist: Dr. Leandrew Koyanagi ? ?Pre-Procedure History & Physical: ?HPI:  Amanda Livingston is a 76 y.o. female here for ophthalmic surgery. ?  ?Past Medical History:  ?Diagnosis Date  ? Arthritis   ? hands  ? Complication of anesthesia   ? Dysrhythmia   ? PACs, PVCs  ? Family history of adverse reaction to anesthesia   ? PONV - mother and son  ? Hypertension   ? Hypothyroidism   ? Non-rheumatic mitral regurgitation   ? mild  ? Osteopenia   ? PONV (postoperative nausea and vomiting)   ? 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  ? 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[place])   ? thyroid  ? ? ?Past Surgical History:  ?Procedure Laterality Date  ? ABDOMINAL HYSTERECTOMY    ? colonscopy  2015  ? CYSTOSCOPY W/ RETROGRADES Bilateral 10/31/2018  ? Procedure: CYSTOSCOPY WITH RETROGRADE PYELOGRAM;  Surgeon: Hollice Espy, MD;  Location: [facility] ORS;  Service: Urology;  Laterality: Bilateral;  ? CYSTOSCOPY WITH BIOPSY N/A 10/31/2018  ? Procedure: CYSTOSCOPY WITH Bladder BIOPSY;  Surgeon: Hollice Espy, MD;  Location: [facility] OR[facility];  Service: Urology;  Laterality: N/A;  ? ESOPHAGOGASTRODUODENOSCOPY (EGD) WITH PROPOFOL N/A 09/30/2017  ? Procedure: ESOPHAGOGASTRODUODENOSCOPY (EGD) WITH PROPOFOL;  Surgeon: Virgel Manifold, MD;  Location: [facility] END[facility];  Service: Endoscopy;  Laterality: N/A;  ? NEPHRECTOMY Left   ? THYROIDECTOMY    ? TONSILLECTOMY Right   ? ? ?Prior to Admission medications   ?Medication Sig Start Date End Date Taking? Authorizing Provider  ?Biotin 1000 MCG tablet Take 1,000 mcg by mouth daily.   Yes [provider]  ?Calcium Carbonate (CALCIUM 500 PO) Take by mouth daily.   Yes [provider]  ?Cholecalciferol (VITAMIN D3) 1000 units CAPS Take 1,000 Units by mouth every other day.    Yes [provider]  ?cloNIDine (CATAPRES) 0.1 MG tablet Take 1 tablet (0.1 mg total) by mouth 2 (two) times daily  as needed. 09/03/15  Yes Daymon Larsen, MD  ?dimenhyDRINATE (DRAMAMINE) 50 MG tablet Take 25 mg by mouth at bedtime.   Yes [provider]  ?estradiol (ESTRACE) 0.5 MG tablet Take 1 tablet (0.5 mg total) by mouth daily. 02/10/21  Yes Lavera Guise, MD  ?levothyroxine (SYNTHROID) 137 MCG tablet Take 137 mcg by mouth daily before breakfast.  12/30/15  Yes [provider]  ?losartan (COZAAR) 25 MG tablet Take 1 tablet (25 mg total) by mouth 2 (two) times daily. 12/11/19  Yes Boscia, Heather E, NP  ?melatonin 3 MG TABS tablet Take 3 mg by mouth at bedtime.   Yes [provider]  ?metoprolol succinate (TOPROL-XL) 25 MG 24 hr tablet Take 12.5 mg by mouth daily.    Yes [provider]  ?tretinoin (RETIN-A) 0.025 % cream Apply 1 application. topically daily as needed. 08/03/21  Yes Abernathy, Yetta Flock, NP  ?vitamin C (ASCORBIC ACID) 500 MG tablet Take 500 mg by mouth daily.   Yes [provider]  ? ? ?Allergies as of 07/09/2021 - Review Complete 07/08/2021  ?Allergen Reaction Noted  ? Levaquin [levofloxacin]  04/06/2021  ? Nitrofurantoin Rash 10/05/2014  ? ? ?Family History  ?Problem Relation Age of Onset  ? Cancer Father   ? ? ?Social History  ? ?Socioeconomic History  ? Marital status: Divorced  ?  Spouse name: Not on file  ? Number of children: Not  on file  ? Years of education: Not on file  ? Highest education level: Not on file  ?Occupational History  ? Not on file  ?Tobacco Use  ? Smoking status: Former  ?  Packs/day: 0.25  ?  Types: Cigarettes  ?  Quit date: 2004  ?  Years since quitting: 19.2  ? Smokeless tobacco: Never  ? Tobacco comments:  ?  3 or 4 cigarettes a day due to stress, trying to quit  ?Vaping Use  ? Vaping Use: Never used  ?Substance and Sexual Activity  ? Alcohol use: Not Currently  ?  Alcohol/week: 7.0 standard drinks  ?  Types: 7 Glasses of wine per week  ?  Comment: half a glass full a few time a week. None since 05/2021  ? Drug use: No  ? Sexual activity: Not  on file  ?Other Topics Concern  ? Not on file  ?Social History Narrative  ? Not on file  ? ?Social Determinants of Health  ? ?Financial Resource Strain: Not on file  ?Food Insecurity: Not on file  ?Transportation Needs: Not on file  ?Physical Activity: Not on file  ?Stress: Not on file  ?Social Connections: Not on file  ?Intimate Partner Violence: Not on file  ? ? ?Review of Systems: ?See HPI, otherwise negative ROS ? ?Physical Exam: ?BP (!) 179/73   Pulse 61   Temp 97.9 ?F (36.6 ?C) (Temporal)   Resp 18   Ht '5\' 3"'$  (1.6 m)   Wt 62.1 kg   SpO2 99%   BMI 24.27 kg/m?  ?General:   Alert,  pleasant and cooperative in NAD ?Head:  Normocephalic and atraumatic. ?Lungs:  Clear to auscultation.    ?Heart:  Regular rate and rhythm.  ? ?Impression/Plan: ?Amanda Livingston is here for ophthalmic surgery. ? ?Risks, benefits, limitations, and alternatives regarding ophthalmic surgery have been reviewed with the patient.  Questions have been answered.  All parties agreeable. ? ? Leandrew Koyanagi, MD  08/20/2021, 8:11 AM ? ?

## 2021-08-20 NOTE — Anesthesia Preprocedure Evaluation (Signed)
Anesthesia Evaluation  ?Patient identified by MRN, date of birth, ID band ?Patient awake ? ? ? ?Reviewed: ?Allergy & Precautions, H&P , NPO status , Patient's Chart, lab work & pertinent test results ? ?History of Anesthesia Complications ?(+) PONV and history of anesthetic complications ? ?Airway ?Mallampati: II ? ?TM Distance: >3 FB ?Neck ROM: full ? ? ? Dental ?no notable dental hx. ? ?  ?Pulmonary ?former smoker,  ?  ?Pulmonary exam normal ?breath sounds clear to auscultation ? ? ? ? ? ? Cardiovascular ?hypertension, Normal cardiovascular exam ?Rhythm:regular Rate:Normal ? ? ?  ?Neuro/Psych ?  ? GI/Hepatic ?  ?Endo/Other  ?Hypothyroidism S/p/ thyroidectomy ? Renal/GU ?  ? ?  ?Musculoskeletal ? ? Abdominal ?  ?Peds ? Hematology ?  ?Anesthesia Other Findings ? ? Reproductive/Obstetrics ? ?  ? ? ? ? ? ? ? ? ? ? ? ? ? ?  ?  ? ? ? ? ? ? ? ? ?Anesthesia Physical ?Anesthesia Plan ? ?ASA: 2 ? ?Anesthesia Plan: MAC  ? ?Post-op Pain Management: Minimal or no pain anticipated  ? ?Induction:  ? ?PONV Risk Score and Plan: 3 and Treatment may vary due to age or medical condition, TIVA, Midazolam and Ondansetron ? ?Airway Management Planned:  ? ?Additional Equipment:  ? ?Intra-op Plan:  ? ?Post-operative Plan:  ? ?Informed Consent: I have reviewed the patients History and Physical, chart, labs and discussed the procedure including the risks, benefits and alternatives for the proposed anesthesia with the patient or authorized representative who has indicated his/her understanding and acceptance.  ? ? ? ?Dental Advisory Given ? ?Plan Discussed with: CRNA ? ?Anesthesia Plan Comments:   ? ? ? ? ? ? ?Anesthesia Quick Evaluation ? ?

## 2021-08-20 NOTE — Op Note (Signed)
LOCATION:  [facility] ?  ?PREOPERATIVE DIAGNOSIS:    Nuclear sclerotic cataract right eye. H25.11 ?  ?POSTOPERATIVE DIAGNOSIS:  Nuclear sclerotic cataract right eye.   ?  ?PROCEDURE:  Phacoemusification with posterior chamber intraocular lens placement of the right eye  ? ?ULTRASOUND TIME: Procedure(s): ?CATARACT EXTRACTION PHACO AND INTRAOCULAR LENS PLACEMENT (IOC) RIGHT MiLOOP 3.10 00:40.8 (Right) ? ?LENS:   ?Implant Name Type Inv. Item Serial No. Manufacturer Lot No. LRB No. Used Action  ?LENS IOL TECNIS EYHANCE 20.5 - [id number] Intraocular Lens LENS IOL TECNIS EYHANCE 20.5 [id number] [employer]  Right 1 Implanted  ?   ?  ?  ?SURGEON:  Wyonia Hough, MD ?  ?ANESTHESIA:  Topical with tetracaine drops and 2% Xylocaine jelly, augmented with 1% preservative-free intracameral lidocaine. ? ?  ?COMPLICATIONS:  None. ?  ?DESCRIPTION OF PROCEDURE:  The patient was identified in the holding room and transported to the operating room and placed in the supine position under the operating microscope.  The right eye was identified as the operative eye and it was prepped and draped in the usual sterile ophthalmic fashion. ?  ?A 1 millimeter clear-corneal paracentesis was made at the 12:00 position.  0.5 ml of preservative-free 1% lidocaine was injected into the anterior chamber. ?The anterior chamber was filled with Viscoat viscoelastic.  A 2.4 millimeter keratome was used to make a near-clear corneal incision at the 9:00 position.  A curvilinear capsulorrhexis was made with a cystotome and capsulorrhexis forceps.  Balanced salt solution was used to hydrodissect and hydrodelineate the nucleus. A MiLoop device was used to prechop the nucleus. ?  ?Phacoemulsification was then used in stop and chop fashion to remove the lens nucleus and epinucleus.  The remaining cortex was then removed using the irrigation and aspiration handpiece. Provisc was then placed into the capsular bag to distend it for lens  placement.  A lens was then injected into the capsular bag.  The remaining viscoelastic was aspirated. ?  ?Wounds were hydrated with balanced salt solution.  The anterior chamber was inflated to a physiologic pressure with balanced salt solution.  No wound leaks were noted. Cefuroxime 0.1 ml of a '10mg'$ /ml solution was injected into the anterior chamber for a dose of 1 mg of intracameral antibiotic at the completion of the case. ?  Timolol and Brimonidine drops were applied to the eye.  The patient was taken to the recovery room in stable condition without complications of anesthesia or surgery. ? ? ?Berenice Oehlert ?08/20/2021, 9:01 AM ? ?

## 2021-08-21 ENCOUNTER — Encounter: Payer: Self-pay | Admitting: Ophthalmology

## 2021-08-21 ENCOUNTER — Other Ambulatory Visit: Payer: Self-pay

## 2021-08-22 NOTE — Telephone Encounter (Signed)
error 

## 2021-08-26 DIAGNOSIS — H2512 Age-related nuclear cataract, left eye: Secondary | ICD-10-CM | POA: Diagnosis not present

## 2021-08-29 ENCOUNTER — Ambulatory Visit
Admission: RE | Admit: 2021-08-29 | Discharge: 2021-08-29 | Disposition: A | Discharge status: Home / Self Care | Payer: Medicare HMO | Source: Ambulatory Visit | Attending: Internal Medicine | Admitting: Internal Medicine

## 2021-08-29 DIAGNOSIS — Z1231 Encounter for screening mammogram for malignant neoplasm of breast: Secondary | ICD-10-CM | POA: Insufficient documentation

## 2021-09-01 ENCOUNTER — Inpatient Hospital Stay
Admission: RE | Admit: 2021-09-01 | Discharge: 2021-09-01 | Disposition: A | Discharge status: Home / Self Care | Payer: Self-pay | Source: Ambulatory Visit | Attending: *Deleted | Admitting: *Deleted

## 2021-09-01 ENCOUNTER — Other Ambulatory Visit: Payer: Self-pay | Admitting: *Deleted

## 2021-09-01 DIAGNOSIS — Z1231 Encounter for screening mammogram for malignant neoplasm of breast: Secondary | ICD-10-CM

## 2021-09-01 NOTE — Discharge Instructions (Signed)

## 2021-09-02 NOTE — Anesthesia Preprocedure Evaluation (Addendum)
Anesthesia Evaluation  ?Patient identified by MRN, date of birth, ID band ?Patient awake ? ?General Assessment Comment:Plan to avoid narcotic because of N/V with prior cataract surgery ? ?Reviewed: ?Allergy & Precautions, H&P , NPO status , Patient's Chart, lab work & pertinent test results, reviewed documented beta blocker date and time  ? ?History of Anesthesia Complications ?(+) PONV and history of anesthetic complications ? ?Airway ?Mallampati: II ? ?TM Distance: >3 FB ?Neck ROM: full ? ? ? Dental ?no notable dental hx. ? ?  ?Pulmonary ?former smoker,  ?  ?Pulmonary exam normal ?breath sounds clear to auscultation ? ? ? ? ? ? Cardiovascular ?Exercise Tolerance: Good ?hypertension, Normal cardiovascular exam ?Rhythm:regular Rate:Normal ? ? ?  ?Neuro/Psych ?negative neurological ROS ? negative psych ROS  ? GI/Hepatic ?Neg liver ROS, hiatal hernia,   ?Endo/Other  ?negative endocrine ROS ? Renal/GU ?negative Renal ROS  ?negative genitourinary ?  ?Musculoskeletal ? ? Abdominal ?  ?Peds ? Hematology ?negative hematology ROS ?(+)   ?Anesthesia Other Findings ? ? Reproductive/Obstetrics ?negative OB ROS ? ?  ? ? ? ? ? ? ? ? ? ? ? ? ? ?  ?  ? ? ? ? ? ? ? ?Anesthesia Physical ?Anesthesia Plan ? ?ASA: 2 ? ?Anesthesia Plan: MAC  ? ?Post-op Pain Management:   ? ?Induction:  ? ?PONV Risk Score and Plan: Ondansetron ? ?Airway Management Planned:  ? ?Additional Equipment:  ? ?Intra-op Plan:  ? ?Post-operative Plan:  ? ?Informed Consent: I have reviewed the patients History and Physical, chart, labs and discussed the procedure including the risks, benefits and alternatives for the proposed anesthesia with the patient or authorized representative who has indicated his/her understanding and acceptance.  ? ? ? ?Dental Advisory Given ? ?Plan Discussed with: CRNA and Anesthesiologist ? ?Anesthesia Plan Comments: (Spoke with patient over the phone on 09/02/21. Patient she had a lot of nausea,  dizziness, and confusion after last cataract procedure. She requests propofol over midazolam/fentanyl if possible. ? ?Dr Sharma Covert)  ? ? ? ? ? ?Anesthesia Quick Evaluation ? ?

## 2021-09-03 ENCOUNTER — Other Ambulatory Visit: Payer: Self-pay

## 2021-09-03 ENCOUNTER — Encounter: Payer: Self-pay | Admitting: Ophthalmology

## 2021-09-03 ENCOUNTER — Encounter
Admission: RE | Disposition: A | Discharge status: Home / Self Care | Payer: Self-pay | Source: Home / Self Care | Attending: Ophthalmology

## 2021-09-03 ENCOUNTER — Ambulatory Visit: Payer: Medicare HMO | Admitting: Anesthesiology

## 2021-09-03 ENCOUNTER — Ambulatory Visit
Admission: RE | Admit: 2021-09-03 | Discharge: 2021-09-03 | Disposition: A | Discharge status: Home / Self Care | Payer: Medicare HMO | Attending: Ophthalmology | Admitting: Ophthalmology

## 2021-09-03 DIAGNOSIS — H2512 Age-related nuclear cataract, left eye: Secondary | ICD-10-CM | POA: Insufficient documentation

## 2021-09-03 DIAGNOSIS — I10 Essential (primary) hypertension: Secondary | ICD-10-CM | POA: Insufficient documentation

## 2021-09-03 DIAGNOSIS — Z87891 Personal history of nicotine dependence: Secondary | ICD-10-CM | POA: Insufficient documentation

## 2021-09-03 DIAGNOSIS — H25812 Combined forms of age-related cataract, left eye: Secondary | ICD-10-CM | POA: Diagnosis not present

## 2021-09-03 DIAGNOSIS — H5212 Myopia, left eye: Secondary | ICD-10-CM | POA: Diagnosis not present

## 2021-09-03 HISTORY — PX: CATARACT EXTRACTION W/PHACO: SHX586

## 2021-09-03 SURGERY — PHACOEMULSIFICATION, CATARACT, WITH IOL INSERTION
Anesthesia: Monitor Anesthesia Care | Site: Eye | Laterality: Left

## 2021-09-03 MED ORDER — CEFUROXIME OPHTHALMIC INJECTION 1 MG/0.1 ML
INJECTION | OPHTHALMIC | Status: DC | PRN
  Administered 2021-09-03: 1 mg via OPHTHALMIC

## 2021-09-03 MED ORDER — ACETAMINOPHEN 325 MG PO TABS: 325.0000 mg | ORAL_TABLET | ORAL | Status: DC | PRN

## 2021-09-03 MED ORDER — ACETAMINOPHEN 160 MG/5ML PO SOLN: 325.0000 mg | ORAL | Status: DC | PRN

## 2021-09-03 MED ORDER — LACTATED RINGERS IV SOLN: INTRAVENOUS | Status: DC

## 2021-09-03 MED ORDER — SIGHTPATH DOSE#1 BSS IO SOLN
INTRAOCULAR | Status: DC | PRN
  Administered 2021-09-03: 65 mL via OPHTHALMIC

## 2021-09-03 MED ORDER — TETRACAINE HCL 0.5 % OP SOLN
1.0000 [drp] | OPHTHALMIC | Status: DC | PRN
  Administered 2021-09-03 (×3): 1 [drp] via OPHTHALMIC

## 2021-09-03 MED ORDER — MIDAZOLAM HCL 2 MG/2ML IJ SOLN
INTRAMUSCULAR | Status: DC | PRN
  Administered 2021-09-03: 2 mg via INTRAVENOUS

## 2021-09-03 MED ORDER — DEXMEDETOMIDINE (PRECEDEX) IN NS 20 MCG/5ML (4 MCG/ML) IV SYRINGE
PREFILLED_SYRINGE | INTRAVENOUS | Status: DC | PRN
  Administered 2021-09-03 (×2): 10 ug via INTRAVENOUS

## 2021-09-03 MED ORDER — SIGHTPATH DOSE#1 BSS IO SOLN
INTRAOCULAR | Status: DC | PRN
Start: 2021-09-03 — End: 2021-09-03
  Administered 2021-09-03: 15 mL

## 2021-09-03 MED ORDER — SIGHTPATH DOSE#1 BSS IO SOLN
INTRAOCULAR | Status: DC | PRN
  Administered 2021-09-03: 1 mL via INTRAMUSCULAR

## 2021-09-03 MED ORDER — SIGHTPATH DOSE#1 NA HYALUR & NA CHOND-NA HYALUR IO KIT
PACK | INTRAOCULAR | Status: DC | PRN
Start: 2021-09-03 — End: 2021-09-03
  Administered 2021-09-03: 1 via OPHTHALMIC

## 2021-09-03 MED ORDER — BRIMONIDINE TARTRATE-TIMOLOL 0.2-0.5 % OP SOLN
OPHTHALMIC | Status: DC | PRN
Start: 2021-09-03 — End: 2021-09-03
  Administered 2021-09-03: 1 [drp] via OPHTHALMIC

## 2021-09-03 MED ORDER — ONDANSETRON HCL 4 MG/2ML IJ SOLN
INTRAMUSCULAR | Status: DC | PRN
  Administered 2021-09-03: 4 mg via INTRAVENOUS

## 2021-09-03 MED ORDER — ARMC OPHTHALMIC DILATING DROPS
1.0000 "application " | OPHTHALMIC | Status: DC | PRN
  Administered 2021-09-03 (×3): 1 via OPHTHALMIC

## 2021-09-03 SURGICAL SUPPLY — 11 items
CATARACT SUITE SIGHTPATH (MISCELLANEOUS) ×2 IMPLANT
FEE CATARACT SUITE SIGHTPATH (MISCELLANEOUS) ×1 IMPLANT
GLOVE SRG 8 PF TXTR STRL LF DI (GLOVE) ×1 IMPLANT
GLOVE SURG ENC TEXT LTX SZ7.5 (GLOVE) ×2 IMPLANT
GLOVE SURG UNDER POLY LF SZ8 (GLOVE) ×2
LENS IOL TECNIS EYHANCE 21.0 (Intraocular Lens) ×1 IMPLANT
NDL FILTER BLUNT 18X1 1/2 (NEEDLE) ×1 IMPLANT
NEEDLE FILTER BLUNT 18X 1/2SAF (NEEDLE) ×1
NEEDLE FILTER BLUNT 18X1 1/2 (NEEDLE) ×1 IMPLANT
SYR 3ML LL SCALE MARK (SYRINGE) ×2 IMPLANT
WATER STERILE IRR 250ML POUR (IV SOLUTION) ×2 IMPLANT

## 2021-09-03 NOTE — Op Note (Signed)
OPERATIVE NOTE ? ?[name] ?[id number] ?09/03/2021 ? ? ?PREOPERATIVE DIAGNOSIS:  Nuclear sclerotic cataract left eye. H25.12 ?  ?POSTOPERATIVE DIAGNOSIS:    Nuclear sclerotic cataract left eye.   ?  ?PROCEDURE:  Phacoemusification with posterior chamber intraocular lens placement of the left eye  ?Ultrasound time: Procedure(s) with comments: ?CATARACT EXTRACTION PHACO AND INTRAOCULAR LENS PLACEMENT (IOC) LEFT (Left) - 6.40 ?01:09.3 ? ?LENS:   ?Implant Name Type Inv. Item Serial No. Manufacturer Lot No. LRB No. Used Action  ?LENS IOL TECNIS EYHANCE 21.0 - [id number] Intraocular Lens LENS IOL TECNIS EYHANCE 21.0 [id number] [employer]  Left 1 Implanted  ?   ? ?SURGEON:  Wyonia Hough, MD ?  ?ANESTHESIA:  Topical with tetracaine drops and 2% Xylocaine jelly, augmented with 1% preservative-free intracameral lidocaine. ? ?  ?COMPLICATIONS:  None. ?  ?DESCRIPTION OF PROCEDURE:  The patient was identified in the holding room and transported to the operating room and placed in the supine position under the operating microscope.  The left eye was identified as the operative eye and it was prepped and draped in the usual sterile ophthalmic fashion. ?  ?A 1 millimeter clear-corneal paracentesis was made at the 1:30 position.  0.5 ml of preservative-free 1% lidocaine was injected into the anterior chamber. ? The anterior chamber was filled with Viscoat viscoelastic.  A 2.4 millimeter keratome was used to make a near-clear corneal incision at the 10:30 position.  .  A curvilinear capsulorrhexis was made with a cystotome and capsulorrhexis forceps.  Balanced salt solution was used to hydrodissect and hydrodelineate the nucleus. ?  ?Phacoemulsification was then used in stop and chop fashion to remove the lens nucleus and epinucleus.  The remaining cortex was then removed using the irrigation and aspiration handpiece. Provisc was then placed into the capsular bag to distend it for lens placement.  A lens was then  injected into the capsular bag.  The remaining viscoelastic was aspirated. ?  ?Wounds were hydrated with balanced salt solution.  The anterior chamber was inflated to a physiologic pressure with balanced salt solution.  No wound leaks were noted. Cefuroxime 0.1 ml of a '10mg'$ /ml solution was injected into the anterior chamber for a dose of 1 mg of intracameral antibiotic at the completion of the case. ?  Timolol and Brimonidine drops were applied to the eye.  The patient was taken to the recovery room in stable condition without complications of anesthesia or surgery. ? ?[name] ?09/03/2021, 9:06 AM ? ?

## 2021-09-03 NOTE — H&P (Signed)
?[facility]  ? ?Primary Care Physician:  Lavera Guise, MD ?Ophthalmologist: Dr. Leandrew Koyanagi ? ?Pre-Procedure History & Physical: ?HPI:  Amanda Livingston is a 76 y.o. female here for ophthalmic surgery. ?  ?Past Medical History:  ?Diagnosis Date  ? Arthritis   ? hands  ? Complication of anesthesia   ? Dysrhythmia   ? PACs, PVCs  ? Family history of adverse reaction to anesthesia   ? PONV - mother and son  ? Hypertension   ? Hypothyroidism   ? Non-rheumatic mitral regurgitation   ? mild  ? Osteopenia   ? PONV (postoperative nausea and vomiting)   ? 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  ? 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[place])   ? thyroid  ? ? ?Past Surgical History:  ?Procedure Laterality Date  ? ABDOMINAL HYSTERECTOMY    ? CATARACT EXTRACTION W/PHACO Right 08/20/2021  ? Procedure: CATARACT EXTRACTION PHACO AND INTRAOCULAR LENS PLACEMENT (IOC) RIGHT MiLOOP 3.10 00:40.8;  Surgeon: Leandrew Koyanagi, MD;  Location: [place];  Service: Ophthalmology;  Laterality: Right;  ? colonscopy  2015  ? CYSTOSCOPY W/ RETROGRADES Bilateral 10/31/2018  ? Procedure: CYSTOSCOPY WITH RETROGRADE PYELOGRAM;  Surgeon: Hollice Espy, MD;  Location: [facility];  Service: Urology;  Laterality: Bilateral;  ? CYSTOSCOPY WITH BIOPSY N/A 10/31/2018  ? Procedure: CYSTOSCOPY WITH Bladder BIOPSY;  Surgeon: Hollice Espy, MD;  Location: [facility];  Service: Urology;  Laterality: N/A;  ? ESOPHAGOGASTRODUODENOSCOPY (EGD) WITH PROPOFOL N/A 09/30/2017  ? Procedure: ESOPHAGOGASTRODUODENOSCOPY (EGD) WITH PROPOFOL;  Surgeon: Virgel Manifold, MD;  Location: [facility];  Service: Endoscopy;  Laterality: N/A;  ? NEPHRECTOMY Left   ? THYROIDECTOMY    ? TONSILLECTOMY Right   ? ? ?Prior to Admission medications   ?Medication Sig Start Date End Date Taking? Authorizing Provider  ?Biotin 1000 MCG tablet Take 1,000 mcg by mouth daily.   Yes [provider]  ?Calcium Carbonate (CALCIUM 500 PO) Take  by mouth daily.   Yes [provider]  ?Cholecalciferol (VITAMIN D3) 1000 units CAPS Take 1,000 Units by mouth every other day.    Yes [provider]  ?cloNIDine (CATAPRES) 0.1 MG tablet Take 1 tablet (0.1 mg total) by mouth 2 (two) times daily as needed. 09/03/15  Yes Daymon Larsen, MD  ?dimenhyDRINATE (DRAMAMINE) 50 MG tablet Take 25 mg by mouth at bedtime.   Yes [provider]  ?estradiol (ESTRACE) 0.5 MG tablet Take 1 tablet (0.5 mg total) by mouth daily. 02/10/21  Yes Lavera Guise, MD  ?levothyroxine (SYNTHROID) 137 MCG tablet Take 137 mcg by mouth daily before breakfast.  12/30/15  Yes [provider]  ?losartan (COZAAR) 25 MG tablet Take 1 tablet (25 mg total) by mouth 2 (two) times daily. 12/11/19  Yes Boscia, Heather E, NP  ?melatonin 3 MG TABS tablet Take 3 mg by mouth at bedtime.   Yes [provider]  ?metoprolol succinate (TOPROL-XL) 25 MG 24 hr tablet Take 12.5 mg by mouth daily.    Yes [provider]  ?tretinoin (RETIN-A) 0.025 % cream Apply 1 application. topically daily as needed. 08/03/21  Yes Abernathy, Yetta Flock, NP  ?vitamin C (ASCORBIC ACID) 500 MG tablet Take 500 mg by mouth daily.   Yes [provider]  ? ? ?Allergies as of 07/09/2021 - Review Complete 07/08/2021  ?Allergen Reaction Noted  ? Levaquin [levofloxacin]  04/06/2021  ? Nitrofurantoin Rash 10/05/2014  ? ? ?Family History  ?  Problem Relation Age of Onset  ? Cancer Father   ? ? ?Social History  ? ?Socioeconomic History  ? Marital status: Divorced  ?  Spouse name: Not on file  ? Number of children: Not on file  ? Years of education: Not on file  ? Highest education level: Not on file  ?Occupational History  ? Not on file  ?Tobacco Use  ? Smoking status: Former  ?  Packs/day: 0.25  ?  Types: Cigarettes  ?  Quit date: 2004  ?  Years since quitting: 19.3  ? Smokeless tobacco: Never  ? Tobacco comments:  ?  3 or 4 cigarettes a day due to stress, trying to quit  ?Vaping Use  ?  Vaping Use: Never used  ?Substance and Sexual Activity  ? Alcohol use: Not Currently  ?  Alcohol/week: 7.0 standard drinks  ?  Types: 7 Glasses of wine per week  ?  Comment: half a glass full a few time a week. None since 05/2021  ? Drug use: No  ? Sexual activity: Not on file  ?Other Topics Concern  ? Not on file  ?Social History Narrative  ? Not on file  ? ?Social Determinants of Health  ? ?Financial Resource Strain: Not on file  ?Food Insecurity: Not on file  ?Transportation Needs: Not on file  ?Physical Activity: Not on file  ?Stress: Not on file  ?Social Connections: Not on file  ?Intimate Partner Violence: Not on file  ? ? ?Review of Systems: ?See HPI, otherwise negative ROS ? ?Physical Exam: ?There were no vitals taken for this visit. ?General:   Alert,  pleasant and cooperative in NAD ?Head:  Normocephalic and atraumatic. ?Lungs:  Clear to auscultation.    ?Heart:  Regular rate and rhythm.  ? ?Impression/Plan: ?Amanda Livingston is here for ophthalmic surgery. ? ?Risks, benefits, limitations, and alternatives regarding ophthalmic surgery have been reviewed with the patient.  Questions have been answered.  All parties agreeable. ? ? Leandrew Koyanagi, MD  09/03/2021, 7:37 AM ? ?

## 2021-09-03 NOTE — Anesthesia Procedure Notes (Signed)
Procedure Name: [place] ?Date/Time: 09/03/2021 8:50 AM ?Performed by: Jeannene Patella, CRNA ?Pre-anesthesia Checklist: Patient identified, Emergency Drugs available, Suction available, Timeout performed and Patient being monitored ?Patient Re-evaluated:Patient Re-evaluated prior to induction ?Oxygen Delivery Method: Nasal cannula ?Placement Confirmation: positive ETCO2 ? ? ? ? ?

## 2021-09-03 NOTE — Transfer of Care (Signed)
Immediate Anesthesia Transfer of Care Note ? ?Patient: Amanda Livingston ? ?Procedure(s) Performed: CATARACT EXTRACTION PHACO AND INTRAOCULAR LENS PLACEMENT (IOC) LEFT (Left: Eye) ? ?Patient Location: PACU ? ?Anesthesia Type: MAC ? ?Level of Consciousness: awake, alert  and patient cooperative ? ?Airway and Oxygen Therapy: Patient Spontanous Breathing and Patient connected to supplemental oxygen ? ?Post-op Assessment: Post-op Vital signs reviewed, Patient's Cardiovascular Status Stable, Respiratory Function Stable, Patent Airway and No signs of Nausea or vomiting ? ?Post-op Vital Signs: Reviewed and stable ? ?Complications: No notable events documented. ? ?

## 2021-09-03 NOTE — Anesthesia Postprocedure Evaluation (Signed)
Anesthesia Post Note ? ?Patient: Amanda Livingston ? ?Procedure(s) Performed: CATARACT EXTRACTION PHACO AND INTRAOCULAR LENS PLACEMENT (IOC) LEFT (Left: Eye) ? ? ?  ?Patient location during evaluation: PACU ?Anesthesia Type: MAC ?Level of consciousness: awake and alert ?Pain management: pain level controlled ?Vital Signs Assessment: post-procedure vital signs reviewed and stable ?Respiratory status: spontaneous breathing, nonlabored ventilation, respiratory function stable and patient connected to nasal cannula oxygen ?Cardiovascular status: stable and blood pressure returned to baseline ?Postop Assessment: no apparent nausea or vomiting ?Anesthetic complications: no ? ? ?No notable events documented. ? ?Trecia Rogers ? ? ? ? ? ?

## 2021-09-04 ENCOUNTER — Encounter: Payer: Self-pay | Admitting: Ophthalmology

## 2021-09-11 DIAGNOSIS — K4090 Unilateral inguinal hernia, without obstruction or gangrene, not specified as recurrent: Secondary | ICD-10-CM | POA: Diagnosis not present

## 2021-09-12 DIAGNOSIS — H43812 Vitreous degeneration, left eye: Secondary | ICD-10-CM | POA: Diagnosis not present

## 2021-09-22 DIAGNOSIS — I340 Nonrheumatic mitral (valve) insufficiency: Secondary | ICD-10-CM | POA: Diagnosis not present

## 2021-09-22 DIAGNOSIS — I10 Essential (primary) hypertension: Secondary | ICD-10-CM | POA: Diagnosis not present

## 2021-09-22 DIAGNOSIS — I491 Atrial premature depolarization: Secondary | ICD-10-CM | POA: Diagnosis not present

## 2021-10-21 ENCOUNTER — Ambulatory Visit (INDEPENDENT_AMBULATORY_CARE_PROVIDER_SITE_OTHER): Payer: Medicare HMO | Admitting: Internal Medicine

## 2021-10-21 ENCOUNTER — Encounter: Payer: Self-pay | Admitting: Internal Medicine

## 2021-10-21 VITALS — BP 133/78 | HR 69 | Temp 98.2°F | Resp 16 | Ht 63.5 in | Wt 137.0 lb

## 2021-10-21 DIAGNOSIS — M25542 Pain in joints of left hand: Secondary | ICD-10-CM

## 2021-10-21 DIAGNOSIS — K219 Gastro-esophageal reflux disease without esophagitis: Secondary | ICD-10-CM | POA: Diagnosis not present

## 2021-10-21 DIAGNOSIS — I10 Essential (primary) hypertension: Secondary | ICD-10-CM | POA: Diagnosis not present

## 2021-10-21 DIAGNOSIS — R300 Dysuria: Secondary | ICD-10-CM | POA: Diagnosis not present

## 2021-10-21 DIAGNOSIS — M25541 Pain in joints of right hand: Secondary | ICD-10-CM

## 2021-10-21 LAB — POCT URINALYSIS DIPSTICK
Bilirubin, UA: NEGATIVE
Blood, UA: NEGATIVE
Glucose, UA: NEGATIVE
Leukocytes, UA: NEGATIVE
Nitrite, UA: NEGATIVE
Protein, UA: POSITIVE — AB
Spec Grav, UA: <=1.005 — AB (ref 1.010–1.025)
Urobilinogen, UA: 0.2 E.U./dL
pH, UA: 6 (ref 5.0–8.0)

## 2021-10-21 NOTE — Progress Notes (Incomplete)
[facility] [place], [place] [address]  Internal MEDICINE  Office Visit Note  Patient Name: Amanda Livingston  [id number]  [id number]  Date of Service: 10/21/2021  Chief Complaint  Patient presents with  . Follow-up    Pt would like to discuss all concerns with provider only  . Hypertension    HPI OA of hands,  Labs for BMP as preop Heart burn at night  Estrogen low dose  Cataract surgery was not pleasant    Current Medication: Outpatient Encounter Medications as of 10/21/2021  Medication Sig  . Biotin 1000 MCG tablet Take 1,000 mcg by mouth daily.  . Calcium Carbonate (CALCIUM 500 PO) Take by mouth daily.  . Cholecalciferol (VITAMIN D3) 1000 units CAPS Take 1,000 Units by mouth every other day.   . cloNIDine (CATAPRES) 0.1 MG tablet Take 1 tablet (0.1 mg total) by mouth 2 (two) times daily as needed.  . dimenhyDRINATE (DRAMAMINE) 50 MG tablet Take 25 mg by mouth at bedtime.  Marland Kitchen estradiol (ESTRACE) 0.5 MG tablet Take 1 tablet (0.5 mg total) by mouth daily.  Marland Kitchen levothyroxine (SYNTHROID) 137 MCG tablet Take 137 mcg by mouth daily before breakfast.   . losartan (COZAAR) 25 MG tablet Take 1 tablet (25 mg total) by mouth 2 (two) times daily.  . melatonin 3 MG TABS tablet Take 3 mg by mouth at bedtime.  . metoprolol succinate (TOPROL-XL) 25 MG 24 hr tablet Take 12.5 mg by mouth daily.   . Probiotic Product (PROBIOTIC DAILY PO) Take by mouth.  . tretinoin (RETIN-A) 0.025 % cream Apply 1 application. topically daily as needed.  . vitamin C (ASCORBIC ACID) 500 MG tablet Take 500 mg by mouth daily.   No facility-administered encounter medications on file as of 10/21/2021.    Surgical History: Past Surgical History:  Procedure Laterality Date  . ABDOMINAL HYSTERECTOMY    . CATARACT EXTRACTION W/PHACO Right 08/20/2021   Procedure: CATARACT EXTRACTION PHACO AND INTRAOCULAR LENS PLACEMENT (IOC) RIGHT MiLOOP 3.10 00:40.8;  Surgeon: Leandrew Koyanagi, MD;   Location: [place];  Service: Ophthalmology;  Laterality: Right;  . CATARACT EXTRACTION W/PHACO Left 09/03/2021   Procedure: CATARACT EXTRACTION PHACO AND INTRAOCULAR LENS PLACEMENT ([place]) LEFT;  Surgeon: Leandrew Koyanagi, MD;  Location: [place];  Service: Ophthalmology;  Laterality: Left;  6.40 01:09.3  . colonscopy  2015  . CYSTOSCOPY W/ RETROGRADES Bilateral 10/31/2018   Procedure: CYSTOSCOPY WITH RETROGRADE PYELOGRAM;  Surgeon: Hollice Espy, MD;  Location: [facility];  Service: Urology;  Laterality: Bilateral;  . CYSTOSCOPY WITH BIOPSY N/A 10/31/2018   Procedure: CYSTOSCOPY WITH Bladder BIOPSY;  Surgeon: Hollice Espy, MD;  Location: [facility];  Service: Urology;  Laterality: N/A;  . ESOPHAGOGASTRODUODENOSCOPY (EGD) WITH PROPOFOL N/A 09/30/2017   Procedure: ESOPHAGOGASTRODUODENOSCOPY (EGD) WITH PROPOFOL;  Surgeon: Virgel Manifold, MD;  Location: [facility];  Service: Endoscopy;  Laterality: N/A;  . NEPHRECTOMY Left   . THYROIDECTOMY    . TONSILLECTOMY Right     Medical History: Past Medical History:  Diagnosis Date  . Arthritis    hands  . Complication of anesthesia   . Dysrhythmia    PACs, PVCs  . Family history of adverse reaction to anesthesia    PONV - mother and son  . Hypertension   . Hypothyroidism   . Non-rheumatic mitral regurgitation    mild  . Osteopenia   . PONV (postoperative nausea and vomiting)   . Solitary kidney, acquired    has right.  Left  injured and removed as child.  . 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[place])    thyroid    Family History: Family History  Problem Relation Age of Onset  . Cancer Father     Social History   Socioeconomic History  . Marital status: Divorced    Spouse name: Not on file  . Number of children: Not on file  . Years of education: Not on file  . Highest education level: Not on file  Occupational History  . Not on file  Tobacco Use  . Smoking status: Former    Packs/day: 0.25    Types: Cigarettes     Quit date: 2004    Years since quitting: 19.4  . Smokeless tobacco: Never  . Tobacco comments:    3 or 4 cigarettes a day due to stress, trying to quit  Vaping Use  . Vaping Use: Never used  Substance and Sexual Activity  . Alcohol use: Not Currently    Alcohol/week: 7.0 standard drinks of alcohol    Types: 7 Glasses of wine per week    Comment: half a glass full a few time a week. None since 05/2021  . Drug use: No  . Sexual activity: Not on file  Other Topics Concern  . Not on file  Social History Narrative  . Not on file   Social Determinants of Health   Financial Resource Strain: Not on file  Food Insecurity: Not on file  Transportation Needs: Not on file  Physical Activity: Not on file  Stress: Not on file  Social Connections: Not on file  Intimate Partner Violence: Not on file      Review of Systems  Constitutional:  Negative for chills, fatigue and unexpected weight change.  HENT:  Negative for congestion, postnasal drip, rhinorrhea, sneezing and sore throat.   Eyes:  Negative for redness.  Respiratory:  Negative for cough, chest tightness and shortness of breath.   Cardiovascular:  Negative for chest pain and palpitations.  Gastrointestinal:  Negative for abdominal pain, constipation, diarrhea, nausea and vomiting.       Heart burn   Genitourinary:  Negative for dysuria and frequency.  Musculoskeletal:  Positive for arthralgias. Negative for back pain, joint swelling and neck pain.  Skin:  Negative for rash.  Neurological: Negative.  Negative for tremors and numbness.  Hematological:  Negative for adenopathy. Does not bruise/bleed easily.  Psychiatric/Behavioral:  Negative for behavioral problems (Depression), sleep disturbance and suicidal ideas. The patient is not nervous/anxious.     Vital Signs: BP 133/78   Pulse 69   Temp 98.2 F (36.8 C)   Resp 16   Ht 5' 3.5" (1.613 m)   Wt 137 lb (62.1 kg)   SpO2 98%   BMI 23.89 kg/m    Physical  Exam Constitutional:      Appearance: Normal appearance.  HENT:     Head: Normocephalic and atraumatic.     Nose: Nose normal.     Mouth/Throat:     Mouth: Mucous membranes are moist.     Pharynx: No posterior oropharyngeal erythema.  Eyes:     Extraocular Movements: Extraocular movements intact.     Pupils: Pupils are equal, round, and reactive to light.  Cardiovascular:     Pulses: Normal pulses.     Heart sounds: Normal heart sounds.  Pulmonary:     Effort: Pulmonary effort is normal.     Breath sounds: Normal breath sounds.  Neurological:     General: No focal deficit present.  Mental Status: She is alert.  Psychiatric:        Mood and Affect: Mood normal.        Behavior: Behavior normal.       Assessment/Plan:   General Counseling: Truitt Leep understanding of the findings of todays visit and agrees with plan of treatment. I have discussed any further diagnostic evaluation that may be needed or ordered today. We also reviewed her medications today. she has been encouraged to call the office with any questions or concerns that should arise related to todays visit.    Orders Placed This Encounter  Procedures  . Basic Metabolic Panel (BMET)    No orders of the defined types were placed in this encounter.   Total time spent:35 Minutes Time spent includes review of chart, medications, test results, and follow up plan with the patient.   [place] Controlled Substance Database was reviewed by me.   Dr Lavera Guise Internal medicine

## 2021-10-22 DIAGNOSIS — M25542 Pain in joints of left hand: Secondary | ICD-10-CM | POA: Diagnosis not present

## 2021-10-22 DIAGNOSIS — I10 Essential (primary) hypertension: Secondary | ICD-10-CM | POA: Diagnosis not present

## 2021-10-22 DIAGNOSIS — M25541 Pain in joints of right hand: Secondary | ICD-10-CM | POA: Diagnosis not present

## 2021-10-22 DIAGNOSIS — K219 Gastro-esophageal reflux disease without esophagitis: Secondary | ICD-10-CM | POA: Diagnosis not present

## 2021-10-23 ENCOUNTER — Telehealth: Payer: Self-pay

## 2021-10-23 LAB — BASIC METABOLIC PANEL
BUN/Creatinine Ratio: 22 (ref 12–28)
BUN: 17 mg/dL (ref 8–27)
CO2: 26 mmol/L (ref 20–29)
Calcium: 9.6 mg/dL (ref 8.7–10.3)
Chloride: 100 mmol/L (ref 96–106)
Creatinine, Ser: 0.76 mg/dL (ref 0.57–1.00)
Glucose: 91 mg/dL (ref 70–99)
Potassium: 4.6 mmol/L (ref 3.5–5.2)
Sodium: 139 mmol/L (ref 134–144)
eGFR: 80 mL/min/{1.73_m2} (ref >59)

## 2021-10-23 NOTE — Telephone Encounter (Signed)
Patient left a message on triage line, attempted to return call no answer left a voicemail

## 2021-10-23 NOTE — Telephone Encounter (Signed)
Patient called on the triage line wanting to know if she should be concerned about protein in her urine? She had a UA done at her PCP office (asymptomatic) showing trace protein. She was advised that Dr. Humphrey Rolls would review these results and he would determine if she needed additional work up. Patient states she would like her Urologist opinion due to her only having one kidney. Results on 10/21/21 show only trace protein, it was explained that there can be multiple reasons for protein in the urine and this is a very small amount. She has a follow up with our office next month. She was advised to keep this appointment a repeat UA will be done at this visit and if protein is still present she may discuss further with Dr. Erlene Quan at that time.

## 2021-10-29 DIAGNOSIS — D2262 Melanocytic nevi of left upper limb, including shoulder: Secondary | ICD-10-CM | POA: Diagnosis not present

## 2021-10-29 DIAGNOSIS — L820 Inflamed seborrheic keratosis: Secondary | ICD-10-CM | POA: Diagnosis not present

## 2021-10-29 DIAGNOSIS — L298 Other pruritus: Secondary | ICD-10-CM | POA: Diagnosis not present

## 2021-10-29 DIAGNOSIS — D485 Neoplasm of uncertain behavior of skin: Secondary | ICD-10-CM | POA: Diagnosis not present

## 2021-10-29 DIAGNOSIS — D2271 Melanocytic nevi of right lower limb, including hip: Secondary | ICD-10-CM | POA: Diagnosis not present

## 2021-10-29 DIAGNOSIS — L814 Other melanin hyperpigmentation: Secondary | ICD-10-CM | POA: Diagnosis not present

## 2021-10-29 DIAGNOSIS — D225 Melanocytic nevi of trunk: Secondary | ICD-10-CM | POA: Diagnosis not present

## 2021-10-29 DIAGNOSIS — D2272 Melanocytic nevi of left lower limb, including hip: Secondary | ICD-10-CM | POA: Diagnosis not present

## 2021-10-29 DIAGNOSIS — R202 Paresthesia of skin: Secondary | ICD-10-CM | POA: Diagnosis not present

## 2021-10-29 DIAGNOSIS — D2261 Melanocytic nevi of right upper limb, including shoulder: Secondary | ICD-10-CM | POA: Diagnosis not present

## 2021-11-19 ENCOUNTER — Ambulatory Visit: Payer: Medicare HMO | Admitting: Urology

## 2021-11-19 ENCOUNTER — Telehealth: Payer: Self-pay

## 2021-11-19 VITALS — BP 134/80 | HR 56 | Ht 63.5 in | Wt 132.0 lb

## 2021-11-19 DIAGNOSIS — Z8603 Personal history of neoplasm of uncertain behavior: Secondary | ICD-10-CM | POA: Diagnosis not present

## 2021-11-19 DIAGNOSIS — D414 Neoplasm of uncertain behavior of bladder: Secondary | ICD-10-CM

## 2021-11-19 LAB — URINALYSIS, COMPLETE
Bilirubin, UA: NEGATIVE
Glucose, UA: NEGATIVE
Ketones, UA: NEGATIVE
Leukocytes,UA: NEGATIVE
Nitrite, UA: NEGATIVE
Protein,UA: NEGATIVE
RBC, UA: NEGATIVE
Specific Gravity, UA: 1.015 (ref 1.005–1.030)
Urobilinogen, Ur: 0.2 mg/dL (ref 0.2–1.0)
pH, UA: 5.5 (ref 5.0–7.5)

## 2021-11-19 LAB — MICROSCOPIC EXAMINATION

## 2021-11-19 NOTE — Progress Notes (Addendum)
   11/19/21  CC:  Chief Complaint  Patient presents with   Cysto     HPI: Amanda Livingston is a 76 y.o.female with a personal history of PUNLMP of the bladder who presents today for an annual surveillance cystoscopy.   She was taken to the operating room on 10/31/2018 for bladder biopsy/resection of this small 1 cm lesion.  Bilateral retrogrades unremarkable.  Surgical pathology consistent with PUNLMP.  She has a history of smoking, she stopped for about 15 years.  Last year she had mentioned that she had resumed smoking but denies this today.  She is no longer smoking.   Vitals:   11/19/21 0848  BP: 134/80  Pulse: (!) 56  NED. A&Ox3.   No respiratory distress   Abd soft, NT, ND Normal external genitalia with patent urethral meatus  Cystoscopy Procedure Note  Patient identification was confirmed, informed consent was obtained, and patient was prepped using Betadine solution.  Lidocaine jelly was administered per urethral meatus.    Procedure: - Flexible cystoscope introduced, without any difficulty.   - Thorough search of the bladder revealed:    normal urethral meatus    normal urothelium    no stones    no ulcers     no tumors    no urethral polyps    no trabeculation - Stellate scar on left dome of bladder wall - Ureteral orifices were normal in position and appearance.  Post-Procedure: - Patient tolerated the procedure well  Assessment/ Plan: 1. PUNLMP of bladder - NED on cystoscopy  - Will continue annual surveillance cystoscopy given her family history    Return in 1 year for cystoscopy.   I,Kailey Littlejohn,acting as a Education administrator for Hollice Espy, MD.,have documented all relevant documentation on the behalf of Hollice Espy, MD,as directed by  Hollice Espy, MD while in the presence of Hollice Espy, MD.  I have reviewed the above documentation for accuracy and completeness, and I agree with the above.   Hollice Espy, MD

## 2021-11-19 NOTE — Telephone Encounter (Signed)
Patient called stating that her note for today's visit states she has resumed smoking. This is incorrect. She is a former smoker and has not resumed smoking.

## 2021-11-24 ENCOUNTER — Ambulatory Visit: Payer: Medicare HMO | Admitting: Physician Assistant

## 2021-12-10 ENCOUNTER — Ambulatory Visit (INDEPENDENT_AMBULATORY_CARE_PROVIDER_SITE_OTHER): Payer: Medicare HMO | Admitting: Nurse Practitioner

## 2021-12-10 ENCOUNTER — Encounter: Payer: Self-pay | Admitting: Nurse Practitioner

## 2021-12-10 VITALS — BP 135/63 | HR 69 | Temp 98.4°F | Resp 16 | Ht 63.5 in | Wt 136.0 lb

## 2021-12-10 DIAGNOSIS — I8311 Varicose veins of right lower extremity with inflammation: Secondary | ICD-10-CM

## 2021-12-10 DIAGNOSIS — E890 Postprocedural hypothyroidism: Secondary | ICD-10-CM

## 2021-12-10 DIAGNOSIS — I10 Essential (primary) hypertension: Secondary | ICD-10-CM

## 2021-12-10 DIAGNOSIS — E559 Vitamin D deficiency, unspecified: Secondary | ICD-10-CM | POA: Diagnosis not present

## 2021-12-10 DIAGNOSIS — I8312 Varicose veins of left lower extremity with inflammation: Secondary | ICD-10-CM | POA: Diagnosis not present

## 2021-12-10 DIAGNOSIS — Z0000 Encounter for general adult medical examination without abnormal findings: Secondary | ICD-10-CM

## 2021-12-10 DIAGNOSIS — D649 Anemia, unspecified: Secondary | ICD-10-CM

## 2021-12-10 DIAGNOSIS — M7521 Bicipital tendinitis, right shoulder: Secondary | ICD-10-CM | POA: Diagnosis not present

## 2021-12-10 DIAGNOSIS — R5383 Other fatigue: Secondary | ICD-10-CM

## 2021-12-10 DIAGNOSIS — K219 Gastro-esophageal reflux disease without esophagitis: Secondary | ICD-10-CM

## 2021-12-10 DIAGNOSIS — Z905 Acquired absence of kidney: Secondary | ICD-10-CM

## 2021-12-10 DIAGNOSIS — Z0189 Encounter for other specified special examinations: Secondary | ICD-10-CM

## 2021-12-10 DIAGNOSIS — I8393 Asymptomatic varicose veins of bilateral lower extremities: Secondary | ICD-10-CM | POA: Diagnosis not present

## 2021-12-10 NOTE — Progress Notes (Signed)
[facility] [place], [place] [address]  Internal MEDICINE  Office Visit Note  Patient Name: Amanda Livingston  [id number]  [id number]  Date of Service: 12/10/2021  Chief Complaint  Patient presents with   Acute Visit    Right arm pain when arm is moved backwards, pain is in upper part of arm      HPI Beryle presents for an acute sick visit for right arm pain.  --gradual onset, comes and goes, radiates to shoulder. No injuries or falls recently. Pain notably increased in the proximal upper arm when elbow in flexed. No difficulty with raising arm straight above head.  --has arthritis of bilateral upper extremities, takes tylenol as needed. Pain is manageable.  Also reports needing referral to kidney doctor due to only having 1 kidney, most recent labs were stable.  Tender and achy spider veins and varicose veins on bilateral lower extremities - request vascular referral.  Has upcoming annual wellness visit, would like routine labs ordered so she can have them done prior to the visit.     Current Medication:  Outpatient Encounter Medications as of 12/10/2021  Medication Sig   Biotin 1000 MCG tablet Take 1,000 mcg by mouth daily.   Calcium Carbonate (CALCIUM 500 PO) Take by mouth daily.   Cholecalciferol (VITAMIN D3) 1000 units CAPS Take 1,000 Units by mouth every other day.    cloNIDine (CATAPRES) 0.1 MG tablet Take 1 tablet (0.1 mg total) by mouth 2 (two) times daily as needed.   dimenhyDRINATE (DRAMAMINE) 50 MG tablet Take 25 mg by mouth at bedtime.   estradiol (ESTRACE) 0.5 MG tablet Take 1 tablet (0.5 mg total) by mouth daily. (Patient taking differently: Take 0.5 mg by mouth every other day.)   levothyroxine (SYNTHROID) 137 MCG tablet Take 137 mcg by mouth daily before breakfast.    losartan (COZAAR) 25 MG tablet Take 1 tablet (25 mg total) by mouth 2 (two) times daily.   melatonin 3 MG TABS tablet Take 3 mg by mouth at bedtime.   metoprolol succinate  (TOPROL-XL) 25 MG 24 hr tablet Take 25 mg by mouth daily.   Probiotic Product (PROBIOTIC DAILY PO) Take by mouth.   tretinoin (RETIN-A) 0.025 % cream Apply 1 application. topically daily as needed.   vitamin C (ASCORBIC ACID) 500 MG tablet Take 500 mg by mouth daily.   No facility-administered encounter medications on file as of 12/10/2021.      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[place])    thyroid     Vital Signs: BP 135/63   Pulse 69   Temp 98.4 F (36.9 C)   Resp 16   Ht 5' 3.5" (1.613 m)   Wt 136 lb (61.7 kg)   SpO2 97%   BMI 23.71 kg/m    Review of Systems  Constitutional:  Negative for chills, fatigue and unexpected weight change.  HENT:  Negative for congestion, postnasal drip, rhinorrhea, sneezing and sore throat.  Eyes:  Negative for redness.  Respiratory:  Negative for cough, chest tightness and shortness of breath.   Cardiovascular:  Negative for chest pain and palpitations.       Tender varicose and spider veins on legs  Gastrointestinal:  Negative for abdominal pain, constipation, diarrhea, nausea and vomiting.       Heart burn   Genitourinary:  Negative for dysuria and frequency.  Musculoskeletal:  Positive for arthralgias (esp right arm and right shoulder). Negative for back pain, joint swelling and neck pain.  Skin:  Negative for rash.  Neurological: Negative.  Negative for tremors and numbness.  Hematological:  Negative for adenopathy. Does not bruise/bleed easily.  Psychiatric/Behavioral:  Negative for behavioral problems (Depression), sleep  disturbance and suicidal ideas. The patient is not nervous/anxious.     Physical Exam Vitals reviewed.  Constitutional:      General: She is not in acute distress.    Appearance: Normal appearance. She is not ill-appearing.  HENT:     Head: Normocephalic and atraumatic.  Eyes:     Pupils: Pupils are equal, round, and reactive to light.  Cardiovascular:     Rate and Rhythm: Normal rate and regular rhythm.  Pulmonary:     Effort: Pulmonary effort is normal. No respiratory distress.  Musculoskeletal:     Right shoulder: No swelling or tenderness. Decreased range of motion (minimal). Normal strength.     Right upper arm: Normal. No swelling, edema or tenderness.  Skin:    Comments: Varicosities and spider veins scattered on bilateral lower extremities  Neurological:     Mental Status: She is alert and oriented to person, place, and time.  Psychiatric:        Mood and Affect: Mood normal.        Behavior: Behavior normal.       Assessment/Plan: 1. Single kidney Need to establish with nephro for continued monitoring of her single kidney, routine labs ordered and nephro referral ordered.  - Ambulatory referral to Nephrology - CBC with Differential/Platelet - CMP14+EGFR - Iron, TIBC and Ferritin Panel - TSH + free T4  2. Spider veins of both lower extremities Tender spider veins, referred to vascular surgery for treatment as requested by patient.  - Ambulatory referral to Vascular Surgery  3. Varicose veins of both lower extremities with inflammation Referred to vascular surgery as requested  4. Biceps tendinitis on right May continue OTC analgesic as discussed. Encouraged use of ice initially and then warm compresses as she starts to use her right shoulder and arm more. Handouts provided with information and stretches. No additional pain medication needed. Rest, ice, heat and stretching as discussed for 2-4 weeks, if no improvement or worsening of symptoms, call the clinic.    5. Hypothyroidism, postop Repeat routine thyroid labs and cholesterol panel. - Lipid Profile - TSH + free T4  6. Vitamin D deficiency Repeat vitamin D level - Vitamin D (25 hydroxy)  7. Anemia, unspecified type Hx anemia; routine, CBC, B12 and iron panel ordered to evaluate current status for AWV in november - CBC with Differential/Platelet - B12 and Folate Panel - Iron, TIBC and Ferritin Panel  8. Routine health maintenance Routine labs ordered for upcoming AWV in november - CBC with Differential/Platelet - CMP14+EGFR - Lipid Profile - Vitamin D (25 hydroxy) - B12 and Folate Panel - Iron, TIBC and Ferritin Panel - TSH + free T4   General Counseling: Mccayla verbalizes understanding of the findings of todays visit and agrees with plan of  treatment. I have discussed any further diagnostic evaluation that may be needed or ordered today. We also reviewed her medications today. she has been encouraged to call the office with any questions or concerns that should arise related to todays visit.    Counseling:    Orders Placed This Encounter  Procedures   CBC with Differential/Platelet   CMP14+EGFR   Lipid Profile   Vitamin D (25 hydroxy)   B12 and Folate Panel   Iron, TIBC and Ferritin Panel   TSH + free T4   Ambulatory referral to Nephrology   Ambulatory referral to Vascular Surgery    No orders of the defined types were placed in this encounter.   Return in about 4 months (around 04/09/2022) for previously scheduled, CPE with [facility].  [place] Controlled Substance Database was reviewed by me for overdose risk score (ORS)  Time spent:30 Minutes Time spent with patient included reviewing progress notes, labs, imaging studies, and discussing plan for follow up.   This patient was seen by Jonetta Osgood, FNP-C in collaboration with Dr. Clayborn Bigness as a part of collaborative care agreement.  Lance Huaracha R. Valetta Fuller, MSN, FNP-C Internal Medicine

## 2021-12-17 DIAGNOSIS — I8312 Varicose veins of left lower extremity with inflammation: Secondary | ICD-10-CM | POA: Diagnosis not present

## 2021-12-17 DIAGNOSIS — E890 Postprocedural hypothyroidism: Secondary | ICD-10-CM | POA: Diagnosis not present

## 2021-12-17 DIAGNOSIS — D509 Iron deficiency anemia, unspecified: Secondary | ICD-10-CM | POA: Diagnosis not present

## 2021-12-17 DIAGNOSIS — R5383 Other fatigue: Secondary | ICD-10-CM | POA: Diagnosis not present

## 2021-12-17 DIAGNOSIS — K219 Gastro-esophageal reflux disease without esophagitis: Secondary | ICD-10-CM | POA: Diagnosis not present

## 2021-12-17 DIAGNOSIS — I10 Essential (primary) hypertension: Secondary | ICD-10-CM | POA: Diagnosis not present

## 2021-12-17 DIAGNOSIS — I8393 Asymptomatic varicose veins of bilateral lower extremities: Secondary | ICD-10-CM | POA: Diagnosis not present

## 2021-12-17 DIAGNOSIS — E559 Vitamin D deficiency, unspecified: Secondary | ICD-10-CM | POA: Diagnosis not present

## 2021-12-17 DIAGNOSIS — I8311 Varicose veins of right lower extremity with inflammation: Secondary | ICD-10-CM | POA: Diagnosis not present

## 2021-12-17 DIAGNOSIS — Z905 Acquired absence of kidney: Secondary | ICD-10-CM | POA: Diagnosis not present

## 2021-12-17 DIAGNOSIS — D508 Other iron deficiency anemias: Secondary | ICD-10-CM | POA: Diagnosis not present

## 2021-12-17 DIAGNOSIS — Z0189 Encounter for other specified special examinations: Secondary | ICD-10-CM | POA: Diagnosis not present

## 2021-12-18 ENCOUNTER — Telehealth: Payer: Self-pay

## 2021-12-18 LAB — LIPID PANEL
Chol/HDL Ratio: 2.8 ratio (ref 0.0–4.4)
Cholesterol, Total: 173 mg/dL (ref 100–199)
HDL: 61 mg/dL (ref >39)
LDL Chol Calc (NIH): 95 mg/dL (ref 0–99)
Triglycerides: 92 mg/dL (ref 0–149)
VLDL Cholesterol Cal: 17 mg/dL (ref 5–40)

## 2021-12-18 LAB — CBC WITH DIFFERENTIAL/PLATELET
Basophils Absolute: 0 10*3/uL (ref 0.0–0.2)
Basos: 0 %
EOS (ABSOLUTE): 0.1 10*3/uL (ref 0.0–0.4)
Eos: 3 %
Hematocrit: 38.5 % (ref 34.0–46.6)
Hemoglobin: 12.8 g/dL (ref 11.1–15.9)
Immature Grans (Abs): 0 10*3/uL (ref 0.0–0.1)
Immature Granulocytes: 0 %
Lymphocytes Absolute: 1.8 10*3/uL (ref 0.7–3.1)
Lymphs: 35 %
MCH: 29 pg (ref 26.6–33.0)
MCHC: 33.2 g/dL (ref 31.5–35.7)
MCV: 87 fL (ref 79–97)
Monocytes Absolute: 0.5 10*3/uL (ref 0.1–0.9)
Monocytes: 11 %
Neutrophils Absolute: 2.6 10*3/uL (ref 1.4–7.0)
Neutrophils: 51 %
Platelets: 209 10*3/uL (ref 150–450)
RBC: 4.41 x10E6/uL (ref 3.77–5.28)
RDW: 12.2 % (ref 11.7–15.4)
WBC: 5.2 10*3/uL (ref 3.4–10.8)

## 2021-12-18 LAB — IRON,TIBC AND FERRITIN PANEL
Ferritin: 118 ng/mL (ref 15–150)
Iron Saturation: 28 % (ref 15–55)
Iron: 88 ug/dL (ref 27–139)
Total Iron Binding Capacity: 318 ug/dL (ref 250–450)
UIBC: 230 ug/dL (ref 118–369)

## 2021-12-18 LAB — CMP14+EGFR
ALT: 14 IU/L (ref 0–32)
AST: 16 IU/L (ref 0–40)
Albumin/Globulin Ratio: 2 (ref 1.2–2.2)
Albumin: 4.3 g/dL (ref 3.8–4.8)
Alkaline Phosphatase: 72 IU/L (ref 44–121)
BUN/Creatinine Ratio: 20 (ref 12–28)
BUN: 14 mg/dL (ref 8–27)
Bilirubin Total: 0.3 mg/dL (ref 0.0–1.2)
CO2: 25 mmol/L (ref 20–29)
Calcium: 9.6 mg/dL (ref 8.7–10.3)
Chloride: 99 mmol/L (ref 96–106)
Creatinine, Ser: 0.7 mg/dL (ref 0.57–1.00)
Globulin, Total: 2.1 g/dL (ref 1.5–4.5)
Glucose: 92 mg/dL (ref 70–99)
Potassium: 4.6 mmol/L (ref 3.5–5.2)
Sodium: 138 mmol/L (ref 134–144)
Total Protein: 6.4 g/dL (ref 6.0–8.5)
eGFR: 88 mL/min/1.73

## 2021-12-18 LAB — B12 AND FOLATE PANEL
Folate: 16 ng/mL (ref >3.0)
Vitamin B-12: 339 pg/mL (ref 232–1245)

## 2021-12-18 LAB — TSH+FREE T4
Free T4: 1.93 ng/dL — ABNORMAL HIGH (ref 0.82–1.77)
TSH: 0.52 u[IU]/mL (ref 0.450–4.500)

## 2021-12-18 LAB — VITAMIN D 25 HYDROXY (VIT D DEFICIENCY, FRACTURES): Vit D, 25-Hydroxy: 60.9 ng/mL (ref 30.0–100.0)

## 2021-12-18 NOTE — Telephone Encounter (Signed)
Nephrology referral sent via [facility] to [place]

## 2021-12-31 ENCOUNTER — Telehealth: Payer: Self-pay

## 2021-12-31 NOTE — Telephone Encounter (Signed)
Nephrology appt 02/05/22 @ [facility]

## 2022-01-08 DIAGNOSIS — E559 Vitamin D deficiency, unspecified: Secondary | ICD-10-CM | POA: Diagnosis not present

## 2022-01-08 DIAGNOSIS — C73 Malignant neoplasm of thyroid gland: Secondary | ICD-10-CM | POA: Diagnosis not present

## 2022-01-08 DIAGNOSIS — E890 Postprocedural hypothyroidism: Secondary | ICD-10-CM | POA: Diagnosis not present

## 2022-01-13 DIAGNOSIS — E890 Postprocedural hypothyroidism: Secondary | ICD-10-CM | POA: Diagnosis not present

## 2022-01-13 DIAGNOSIS — E559 Vitamin D deficiency, unspecified: Secondary | ICD-10-CM | POA: Diagnosis not present

## 2022-01-13 DIAGNOSIS — E0789 Other specified disorders of thyroid: Secondary | ICD-10-CM | POA: Diagnosis not present

## 2022-01-13 DIAGNOSIS — Z85850 Personal history of malignant neoplasm of thyroid: Secondary | ICD-10-CM | POA: Diagnosis not present

## 2022-01-13 DIAGNOSIS — Z79899 Other long term (current) drug therapy: Secondary | ICD-10-CM | POA: Diagnosis not present

## 2022-01-13 DIAGNOSIS — Z760 Encounter for issue of repeat prescription: Secondary | ICD-10-CM | POA: Diagnosis not present

## 2022-01-26 ENCOUNTER — Ambulatory Visit (INDEPENDENT_AMBULATORY_CARE_PROVIDER_SITE_OTHER): Payer: Medicare HMO | Admitting: Gastroenterology

## 2022-01-26 ENCOUNTER — Encounter: Payer: Self-pay | Admitting: Gastroenterology

## 2022-01-26 VITALS — BP 133/78 | HR 76 | Temp 98.2°F | Wt 136.0 lb

## 2022-01-26 DIAGNOSIS — R197 Diarrhea, unspecified: Secondary | ICD-10-CM | POA: Diagnosis not present

## 2022-01-26 NOTE — Progress Notes (Signed)
Primary Care Physician: Lavera Guise, MD  Primary Gastroenterologist:  Dr. Lucilla Lame  Chief Complaint  Patient presents with   Diarrhea    HPI: Amanda Livingston is a 76 y.o. female here with a history of chronic constipation that changed to diarrhea when she was seen by Dr. Bonna Gains.  The patient was found to have Yersinia at that time and then followed up with the patient with a GI panel being negative.  The patient was also told to avoid dairy products and take a probiotic since her symptoms had started shortly after taking antibiotics around Thanksgiving last year. She has alternating diarrhea and constipation. There is no report of any black stools or bloody stools.  The patient also denies any unexplained weight loss nausea or vomiting.  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[place])    thyroid    Current Outpatient Medications  Medication Sig Dispense Refill   Biotin 1000 MCG tablet Take 1,000 mcg by mouth daily.     Calcium Carbonate (CALCIUM 500 PO) Take by mouth daily.     Cholecalciferol (VITAMIN D3) 1000 units CAPS Take 1,000 Units by mouth every other day.      cloNIDine (CATAPRES) 0.1 MG tablet Take 1 tablet (0.1 mg total) by mouth 2 (two) times daily as needed. 30 tablet 0   dimenhyDRINATE (DRAMAMINE) 50 MG tablet Take 25 mg by mouth at bedtime.     estradiol (ESTRACE) 0.5 MG tablet Take 1 tablet (0.5 mg total) by mouth daily. (Patient taking differently: Take 0.5 mg by mouth every other day.) 90 tablet 3   levothyroxine (SYNTHROID) 137 MCG tablet Take 137 mcg by mouth daily  before breakfast.      losartan (COZAAR) 25 MG tablet Take 1 tablet (25 mg total) by mouth 2 (two) times daily. 180 tablet 4   melatonin 3 MG TABS tablet Take 3 mg by mouth at bedtime.     metoprolol succinate (TOPROL-XL) 25 MG 24 hr tablet Take 25 mg by mouth daily.     Probiotic Product (PROBIOTIC DAILY PO) Take by mouth.     tretinoin (RETIN-A) 0.025 % cream Apply 1 application. topically daily as needed. 20 g 6   vitamin C (ASCORBIC ACID) 500 MG tablet Take 500 mg by mouth daily.     No current facility-administered medications for this visit.    Allergies as of 01/26/2022 - Review Complete 01/26/2022  Allergen Reaction Noted   Levaquin [levofloxacin]  04/06/2021   Nitrofurantoin Rash 10/05/2014    ROS:  General: Negative for anorexia, weight loss, fever, chills, fatigue, weakness. ENT: Negative for hoarseness, difficulty swallowing , nasal congestion. CV: Negative for chest pain, angina, palpitations, dyspnea on exertion, peripheral edema.  Respiratory: Negative for dyspnea at rest, dyspnea on exertion, cough, sputum, wheezing.  GI: See history of present illness. GU:  Negative for dysuria, hematuria, urinary incontinence, urinary frequency, nocturnal urination.  Endo: Negative for unusual weight change.    Physical Examination:  BP 133/78   Pulse 76   Temp 98.2 F (36.8 C) (Oral)   Wt 136 lb (61.7 kg)   BMI 23.71 kg/m   General: Well-nourished, well-developed in no acute distress.  Eyes: No icterus. Conjunctivae pink. Neuro: Alert and oriented x 3.  Grossly intact. Skin: Warm and dry, no jaundice.   Psych: Alert and cooperative, normal mood and affect.  Labs:    Imaging Studies: No results found.  Assessment and Plan:   Amanda Livingston is a 76 y.o. y/o female who comes in today with a history of diarrhea for some time.  The patient reports that it is not only diarrhea but she has alternating diarrhea and constipation.  It is worse with certain foods.  The  patient has been told to increase her fiber intake and avoid foods that trigger her diarrhea. 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     Lucilla Lame, MD. [name]    Note: This dictation was prepared with Dragon dictation along with smaller phrase technology. Any transcriptional errors that result from this process are unintentional.

## 2022-02-03 ENCOUNTER — Encounter (INDEPENDENT_AMBULATORY_CARE_PROVIDER_SITE_OTHER): Payer: Medicare HMO | Admitting: Vascular Surgery

## 2022-02-05 DIAGNOSIS — I10 Essential (primary) hypertension: Secondary | ICD-10-CM | POA: Diagnosis not present

## 2022-02-05 DIAGNOSIS — Z905 Acquired absence of kidney: Secondary | ICD-10-CM | POA: Diagnosis not present

## 2022-02-18 ENCOUNTER — Other Ambulatory Visit: Payer: Self-pay | Admitting: Internal Medicine

## 2022-02-18 DIAGNOSIS — N959 Unspecified menopausal and perimenopausal disorder: Secondary | ICD-10-CM

## 2022-03-06 ENCOUNTER — Encounter (INDEPENDENT_AMBULATORY_CARE_PROVIDER_SITE_OTHER): Payer: Medicare HMO | Admitting: Vascular Surgery

## 2022-03-09 ENCOUNTER — Encounter (INDEPENDENT_AMBULATORY_CARE_PROVIDER_SITE_OTHER): Payer: Self-pay

## 2022-03-18 IMAGING — CT CT ABD-PELV W/O CM
2 of 4 series · 16 of 46 positions shown, 18 images · non-contrast
Comparison: None.

CLINICAL DATA: Left lower quadrant bulge. Evaluate for hernia.
History of left nephrectomy.



[Series 2: axials routine abdomen pelvis without 5.00 · axial · non-contrast · 0.77mm/px · z∈[-1472,-1092]mm · 13 of 88 slices shown, 15 images]
[im 6/88  soft-tissue]
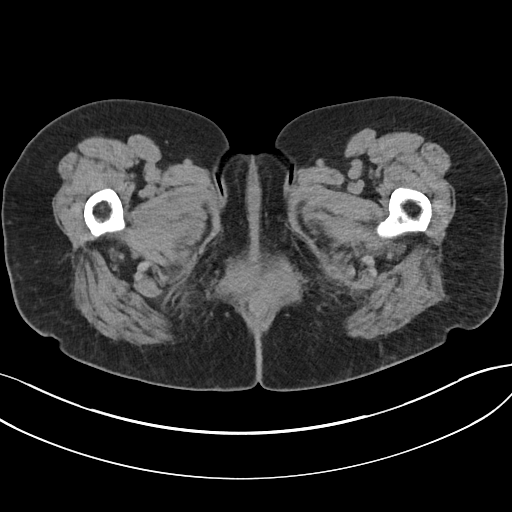
[im 6/88  bone]
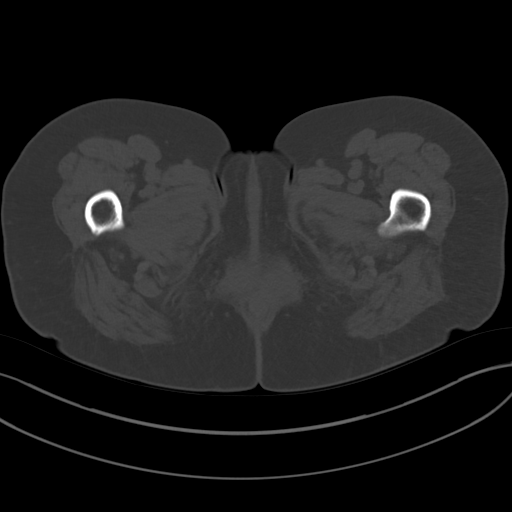
[im 12/88  soft-tissue]
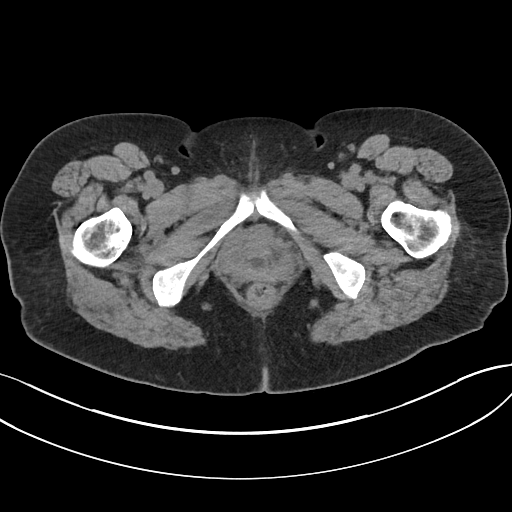
[im 18/88  soft-tissue]
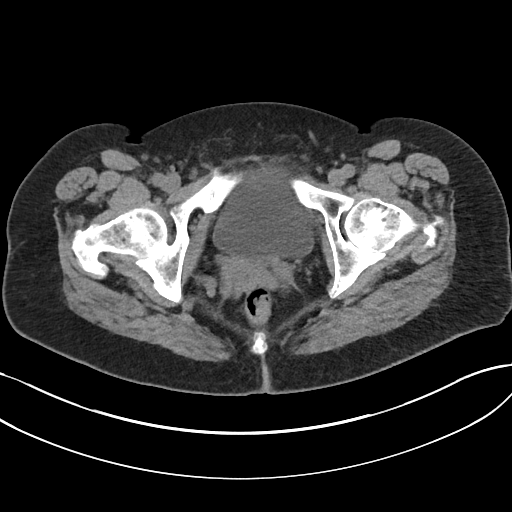
[im 24/88  soft-tissue]
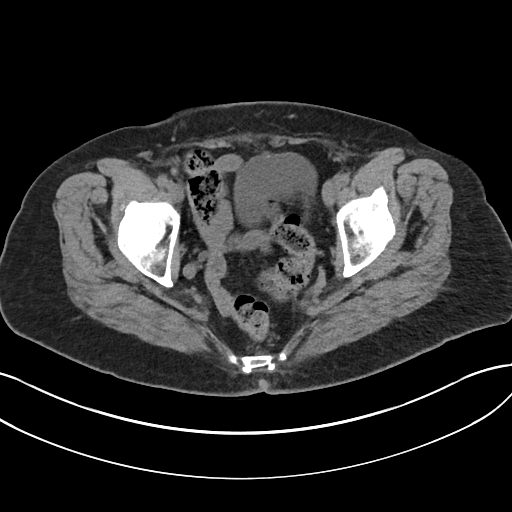
[im 30/88  soft-tissue]
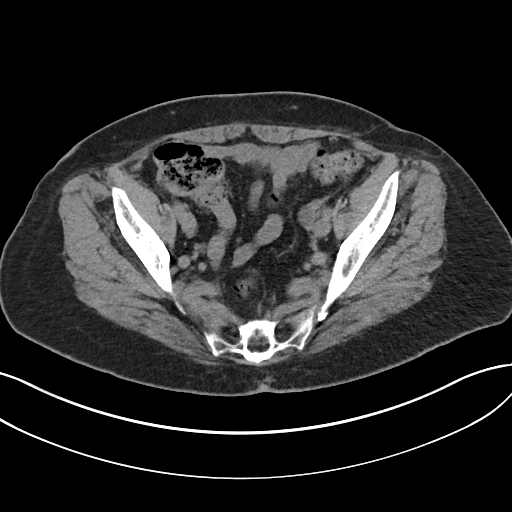
[im 35/88  soft-tissue]
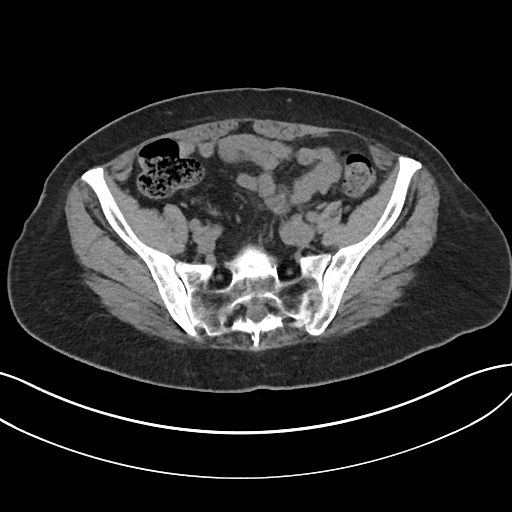
[im 47/88  soft-tissue]
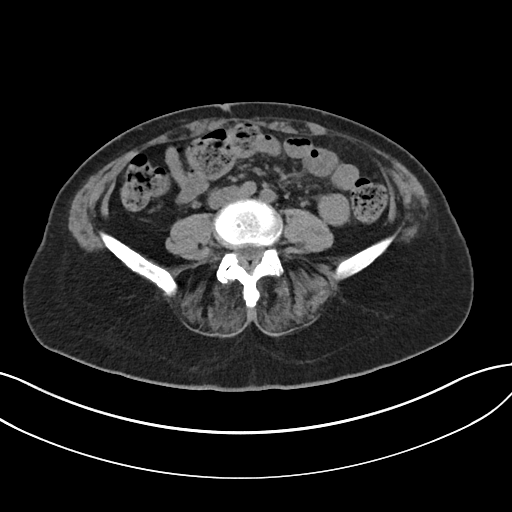
[im 53/88  soft-tissue]
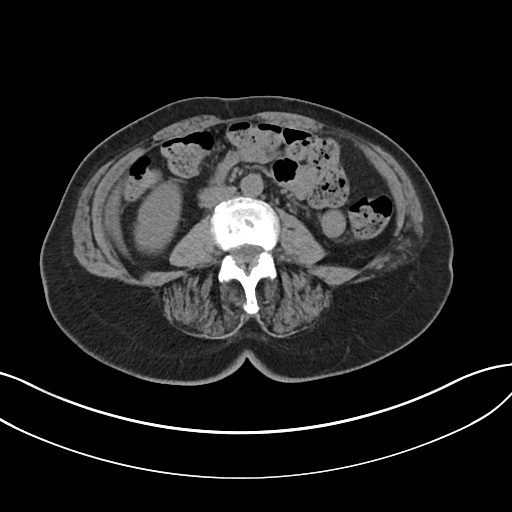
[im 59/88  soft-tissue]
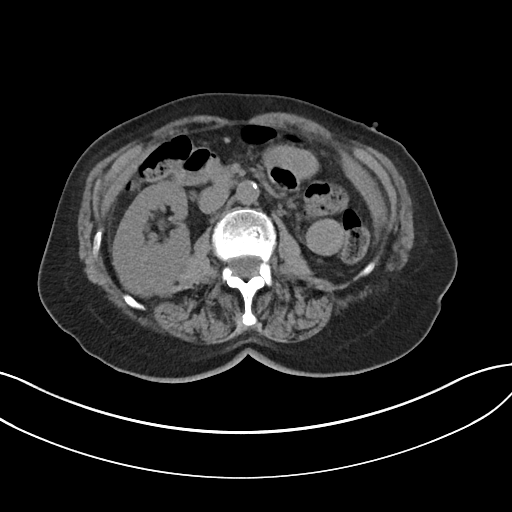
[im 59/88  bone]
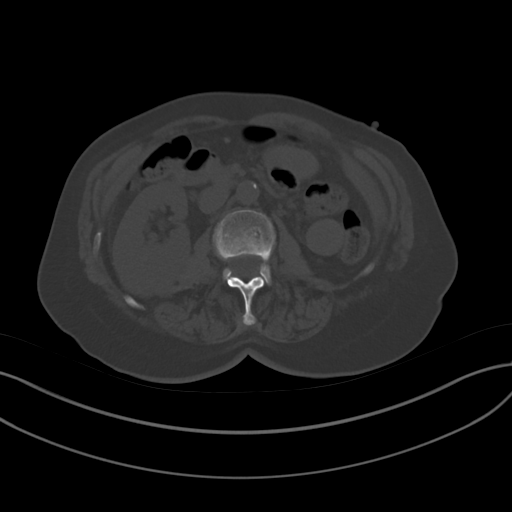
[im 64/88  soft-tissue]
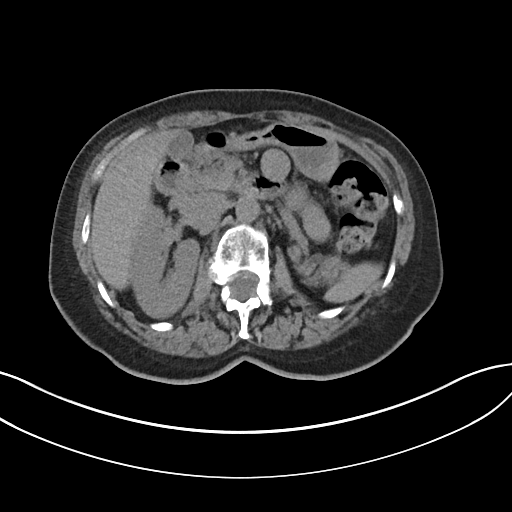
[im 70/88  soft-tissue]
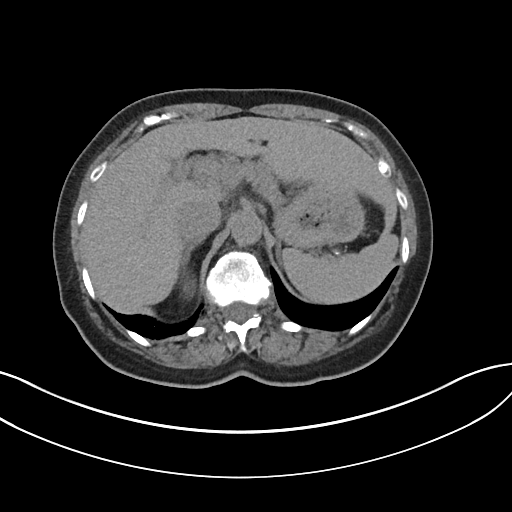
[im 76/88  soft-tissue]
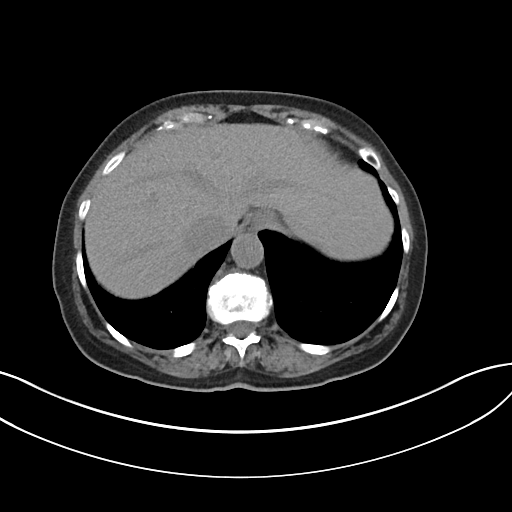
[im 82/88  soft-tissue]
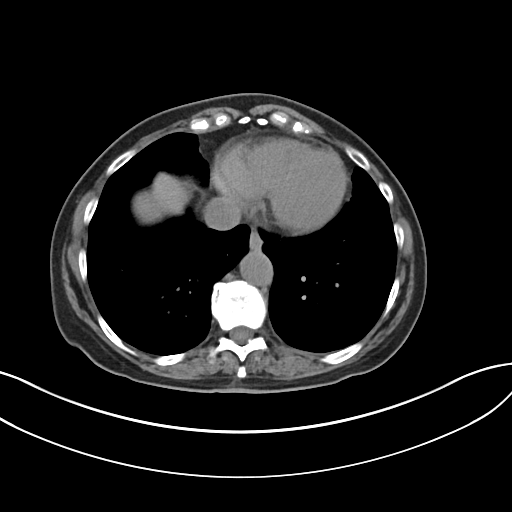

[Series 4: coronals routine abdomen pelvis without 2.00 cor · coronal · non-contrast · 0.77mm/px · 3 of 122 slices shown]
[im 41/122  soft-tissue]
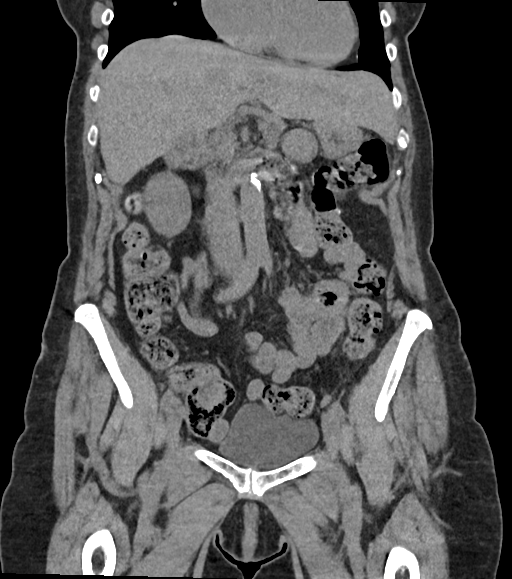
[im 54/122  soft-tissue]
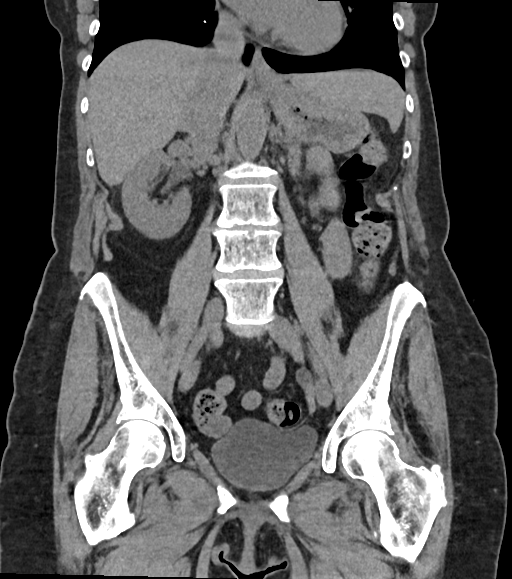
[im 68/122  soft-tissue]
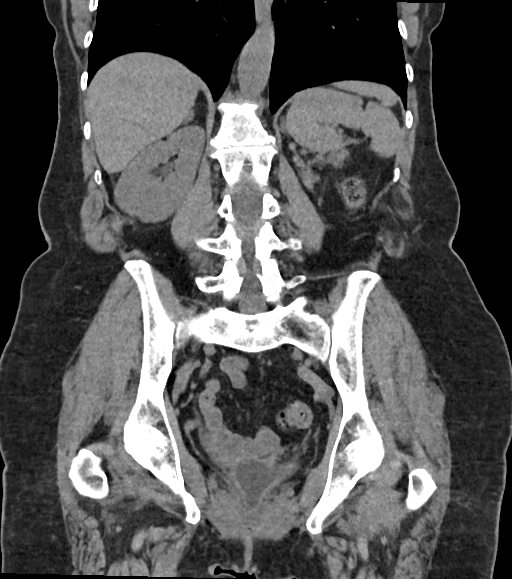

[16 of 46 positions shown; findings below may reference images not displayed]

FINDINGS: Lower chest: No acute abnormality.

Hepatobiliary: No focal liver abnormality is seen. No gallstones,
gallbladder wall thickening, or biliary dilatation.

Pancreas: Unremarkable. No pancreatic ductal dilatation or
surrounding inflammatory changes.

Spleen: Normal in size without focal abnormality.

Adrenals/Urinary Tract: The patient is status post left nephrectomy.
The adrenal glands, right kidney, right ureter, and bladder are
normal.

Stomach/Bowel: Stomach and small bowel are normal. Moderate fecal
loading throughout the colon. Colon is unremarkable. The appendix is
not visualized but there is no secondary evidence of appendicitis.

Vascular/Lymphatic: Mild atherosclerotic change in the nonaneurysmal
aorta. No adenopathy.

Reproductive: Prostate is unremarkable.

Other: There is a small fat containing umbilical hernia. No hernia
in the region of the patient's left lower quadrant ball to explain
the symptoms. No cause for the symptoms identified.

Musculoskeletal: No acute or significant osseous findings.
IMPRESSION: 1. No cause for the patient's left lower quadrant bulge identified.
No hernia in this region.
2. Small fat containing umbilical hernia.
3. Moderate fecal loading throughout the colon.
4. Mild atherosclerotic change in the nonaneurysmal aorta.

## 2022-04-06 DIAGNOSIS — H43813 Vitreous degeneration, bilateral: Secondary | ICD-10-CM | POA: Diagnosis not present

## 2022-04-09 ENCOUNTER — Ambulatory Visit: Payer: Medicare HMO | Admitting: Internal Medicine

## 2022-04-21 ENCOUNTER — Ambulatory Visit (INDEPENDENT_AMBULATORY_CARE_PROVIDER_SITE_OTHER): Payer: Medicare HMO | Admitting: Internal Medicine

## 2022-04-21 ENCOUNTER — Encounter: Payer: Self-pay | Admitting: Internal Medicine

## 2022-04-21 ENCOUNTER — Telehealth: Payer: Self-pay | Admitting: Internal Medicine

## 2022-04-21 VITALS — BP 124/66 | HR 62 | Temp 97.9°F | Resp 16 | Ht 63.5 in | Wt 133.6 lb

## 2022-04-21 DIAGNOSIS — I10 Essential (primary) hypertension: Secondary | ICD-10-CM

## 2022-04-21 DIAGNOSIS — M19041 Primary osteoarthritis, right hand: Secondary | ICD-10-CM

## 2022-04-21 DIAGNOSIS — Z0001 Encounter for general adult medical examination with abnormal findings: Secondary | ICD-10-CM

## 2022-04-21 DIAGNOSIS — E039 Hypothyroidism, unspecified: Secondary | ICD-10-CM | POA: Diagnosis not present

## 2022-04-21 DIAGNOSIS — E2839 Other primary ovarian failure: Secondary | ICD-10-CM

## 2022-04-21 DIAGNOSIS — M19042 Primary osteoarthritis, left hand: Secondary | ICD-10-CM

## 2022-04-21 DIAGNOSIS — R300 Dysuria: Secondary | ICD-10-CM

## 2022-04-21 NOTE — Telephone Encounter (Signed)
Mailed today's AVS to patient-Amanda Livingston

## 2022-04-21 NOTE — Progress Notes (Signed)
[facility], [place] [address]  Internal MEDICINE  Office Visit Note  Patient Name: Amanda Livingston  [id number]  [id number]  Date of Service: 05/07/2022  Chief Complaint  Patient presents with   Medicare Wellness   Hypertension   Quality Metric Gaps    Shingles and TDAP vaccines     HPI Pt is here for routine health maintenance examination No major complaints today except having stiffness and hand pain H/O thyroid cancer, followed by endocrinology in [place] however will like to change to local consultant  Will be due for bone density  Still on HRT, knows the risks and does not want to go off of it   Current Medication: Outpatient Encounter Medications as of 04/21/2022  Medication Sig   Biotin 1000 MCG tablet Take 1,000 mcg by mouth daily.   Calcium Carbonate (CALCIUM 500 PO) Take by mouth daily.   Cholecalciferol (VITAMIN D3) 1000 units CAPS Take 1,000 Units by mouth every other day.    cloNIDine (CATAPRES) 0.1 MG tablet Take 1 tablet (0.1 mg total) by mouth 2 (two) times daily as needed.   dimenhyDRINATE (DRAMAMINE) 50 MG tablet Take 25 mg by mouth at bedtime.   estradiol (ESTRACE) 0.5 MG tablet TAKE 1 TABLET BY MOUTH EVERY DAY   levothyroxine (SYNTHROID) 137 MCG tablet Take 137 mcg by mouth daily before breakfast.    losartan (COZAAR) 25 MG tablet Take 1 tablet (25 mg total) by mouth 2 (two) times daily.   melatonin 3 MG TABS tablet Take 3 mg by mouth at bedtime.   metoprolol succinate (TOPROL-XL) 25 MG 24 hr tablet Take 25 mg by mouth daily.   Probiotic Product (PROBIOTIC DAILY PO) Take by mouth.   tretinoin (RETIN-A) 0.025 % cream Apply 1 application. topically daily as needed.   vitamin C (ASCORBIC ACID) 500 MG tablet Take 500 mg by mouth daily.   No facility-administered encounter medications on file as of 04/21/2022.    Surgical History: Past Surgical History:  Procedure Laterality Date   ABDOMINAL HYSTERECTOMY      CATARACT EXTRACTION W/PHACO Right 08/20/2021   Procedure: CATARACT EXTRACTION PHACO AND INTRAOCULAR LENS PLACEMENT (IOC) RIGHT MiLOOP 3.10 00:40.8;  Surgeon: Leandrew Koyanagi, MD;  Location: [facility];  Service: Ophthalmology;  Laterality: Right;   CATARACT EXTRACTION W/PHACO Left 09/03/2021   Procedure: CATARACT EXTRACTION PHACO AND INTRAOCULAR LENS PLACEMENT ([place]) LEFT;  Surgeon: Leandrew Koyanagi, MD;  Location: [facility];  Service: Ophthalmology;  Laterality: Left;  6.40 01:09.3   colonscopy  2015   CYSTOSCOPY W/ RETROGRADES Bilateral 10/31/2018   Procedure: CYSTOSCOPY WITH RETROGRADE PYELOGRAM;  Surgeon: Hollice Espy, MD;  Location: [facility];  Service: Urology;  Laterality: Bilateral;   CYSTOSCOPY WITH BIOPSY N/A 10/31/2018   Procedure: CYSTOSCOPY WITH Bladder BIOPSY;  Surgeon: Hollice Espy, MD;  Location: [facility];  Service: Urology;  Laterality: N/A;   ESOPHAGOGASTRODUODENOSCOPY (EGD) WITH PROPOFOL N/A 09/30/2017   Procedure: ESOPHAGOGASTRODUODENOSCOPY (EGD) WITH PROPOFOL;  Surgeon: Virgel Manifold, MD;  Location: [facility];  Service: Endoscopy;  Laterality: N/A;   NEPHRECTOMY Left    THYROIDECTOMY     TONSILLECTOMY Right     Medical History: Past Medical History:  Diagnosis Date   Arthritis    hands   Complication of anesthesia    Dysrhythmia    PACs, PVCs   Family history of adverse reaction to anesthesia    PONV - mother and son   Hypertension    Hypothyroidism  Non-rheumatic mitral regurgitation    mild   Osteopenia    PONV (postoperative nausea and vomiting)    Solitary kidney, 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[place])    thyroid    Family History: Family History  Problem Relation Age of Onset   Cancer Father     Social History: Social History   Socioeconomic History   Marital status: Divorced    Spouse name: Not on file   Number of children: Not on file   Years of  education: Not on file   Highest education level: Not on file  Occupational History   Not on file  Tobacco Use   Smoking status: Former    Packs/day: 0.25    Types: Cigarettes    Quit date: 2004    Years since quitting: 20.0   Smokeless tobacco: Never  Vaping Use   Vaping Use: Never used  Substance and Sexual Activity   Alcohol use: Not Currently    Alcohol/week: 7.0 standard drinks of alcohol    Types: 7 Glasses of wine per week    Comment: half a glass full a few time a week. None since 05/2021   Drug use: No   Sexual activity: Not on file  Other Topics Concern   Not on file  Social History Narrative   Not on file   Social Determinants of Health   Financial Resource Strain: Not on file  Food Insecurity: Not on file  Transportation Needs: Not on file  Physical Activity: Not on file  Stress: Not on file  Social Connections: Not on file      Review of Systems  Constitutional:  Negative for chills, fatigue and unexpected weight change.  HENT:  Negative for congestion, postnasal drip, rhinorrhea, sneezing and sore throat.   Eyes:  Negative for redness.  Respiratory:  Negative for cough, chest tightness and shortness of breath.   Cardiovascular:  Negative for chest pain and palpitations.  Gastrointestinal:  Negative for abdominal pain, constipation, diarrhea, nausea and vomiting.  Genitourinary:  Negative for dysuria and frequency.  Musculoskeletal:  Negative for arthralgias, back pain, joint swelling and neck pain.  Skin:  Negative for rash.  Neurological: Negative.  Negative for tremors and numbness.  Hematological:  Negative for adenopathy. Does not bruise/bleed easily.  Psychiatric/Behavioral:  Negative for behavioral problems (Depression), sleep disturbance and suicidal ideas. The patient is not nervous/anxious.      Vital Signs: BP 124/66   Pulse 62   Temp 97.9 F (36.6 C)   Resp 16   Ht 5' 3.5" (1.613 m)   Wt 133 lb 9.6 oz (60.6 kg)   SpO2 98%   BMI  23.29 kg/m    Physical Exam Constitutional:      Appearance: Normal appearance.  HENT:     Head: Normocephalic and atraumatic.     Nose: Nose normal.     Mouth/Throat:     Mouth: Mucous membranes are moist.     Pharynx: No posterior oropharyngeal erythema.  Eyes:     Extraocular Movements: Extraocular movements intact.     Pupils: Pupils are equal, round, and reactive to light.  Cardiovascular:     Rate and Rhythm: Normal rate and regular rhythm.     Pulses: Normal pulses.     Heart sounds: Normal heart sounds.  Pulmonary:     Effort: Pulmonary effort is normal.     Breath sounds: Normal breath sounds.  Neurological:     General: No focal deficit present.     Mental Status: She is alert.  Psychiatric:        Mood and Affect: Mood normal.        Behavior: Behavior normal.        Assessment/Plan: 1. Encounter for general adult medical examination with abnormal findings Pt is up to date on PHM, will monitor along   2. Hypothyroidism, unspecified type Continue as per endocrinology   3. Primary osteoarthritis of both hands Pt will try topical Voltaren OTC   4. Benign hypertension Continue to monitor  5. Other primary ovarian failure - DG Bone Density; Future  6. Dysuria - UA/M w/rflx Culture, Routine - Microscopic Examination  General Counseling: easton sivertson understanding of the findings of todays visit and agrees with plan of treatment. I have discussed any further diagnostic evaluation that may be needed or ordered today. We also reviewed her medications today. she has been encouraged to call the office with any questions or concerns that should arise related to todays visit.    Counseling:  [place] Controlled Substance Database was reviewed by me.  Orders Placed This Encounter  Procedures   Microscopic Examination   DG Bone Density   UA/M w/rflx Culture, Routine    No orders of the defined types were placed in this encounter.   Total time  spent:35 Minutes  Time spent includes review of chart, medications, test results, and follow up plan with the patient.     Lavera Guise, MD  Internal Medicine

## 2022-04-22 LAB — UA/M W/RFLX CULTURE, ROUTINE
Bilirubin, UA: NEGATIVE
Glucose, UA: NEGATIVE
Ketones, UA: NEGATIVE
Leukocytes,UA: NEGATIVE
Nitrite, UA: NEGATIVE
Protein,UA: NEGATIVE
RBC, UA: NEGATIVE
Specific Gravity, UA: 1.014 (ref 1.005–1.030)
Urobilinogen, Ur: 0.2 mg/dL (ref 0.2–1.0)
pH, UA: 6 (ref 5.0–7.5)

## 2022-04-22 LAB — MICROSCOPIC EXAMINATION
Bacteria, UA: NONE SEEN
Casts: NONE SEEN /lpf
Epithelial Cells (non renal): >10 /hpf — AB (ref 0–10)

## 2022-04-28 ENCOUNTER — Encounter (INDEPENDENT_AMBULATORY_CARE_PROVIDER_SITE_OTHER): Payer: Medicare HMO | Admitting: Vascular Surgery

## 2022-04-29 DIAGNOSIS — I779 Disorder of arteries and arterioles, unspecified: Secondary | ICD-10-CM | POA: Diagnosis not present

## 2022-04-29 DIAGNOSIS — R002 Palpitations: Secondary | ICD-10-CM | POA: Diagnosis not present

## 2022-04-29 DIAGNOSIS — I491 Atrial premature depolarization: Secondary | ICD-10-CM | POA: Diagnosis not present

## 2022-04-29 DIAGNOSIS — I340 Nonrheumatic mitral (valve) insufficiency: Secondary | ICD-10-CM | POA: Diagnosis not present

## 2022-04-29 DIAGNOSIS — I10 Essential (primary) hypertension: Secondary | ICD-10-CM | POA: Diagnosis not present

## 2022-04-29 DIAGNOSIS — I493 Ventricular premature depolarization: Secondary | ICD-10-CM | POA: Diagnosis not present

## 2022-05-20 IMAGING — MG MM DIGITAL SCREENING BILAT W/ TOMO AND CAD
6 of 10 series · 6 of 30 positions shown · non-contrast
Comparison: Previous exam(s).

CLINICAL DATA: Screening.

EXAM:
DIGITAL SCREENING BILATERAL MAMMOGRAM WITH TOMOSYNTHESIS AND CAD
TECHNIQUE: Bilateral screening digital craniocaudal and mediolateral oblique
mammograms were obtained. Bilateral screening digital breast
tomosynthesis was performed. The images were evaluated with
computer-aided detection.

[L MLO synth-2D]
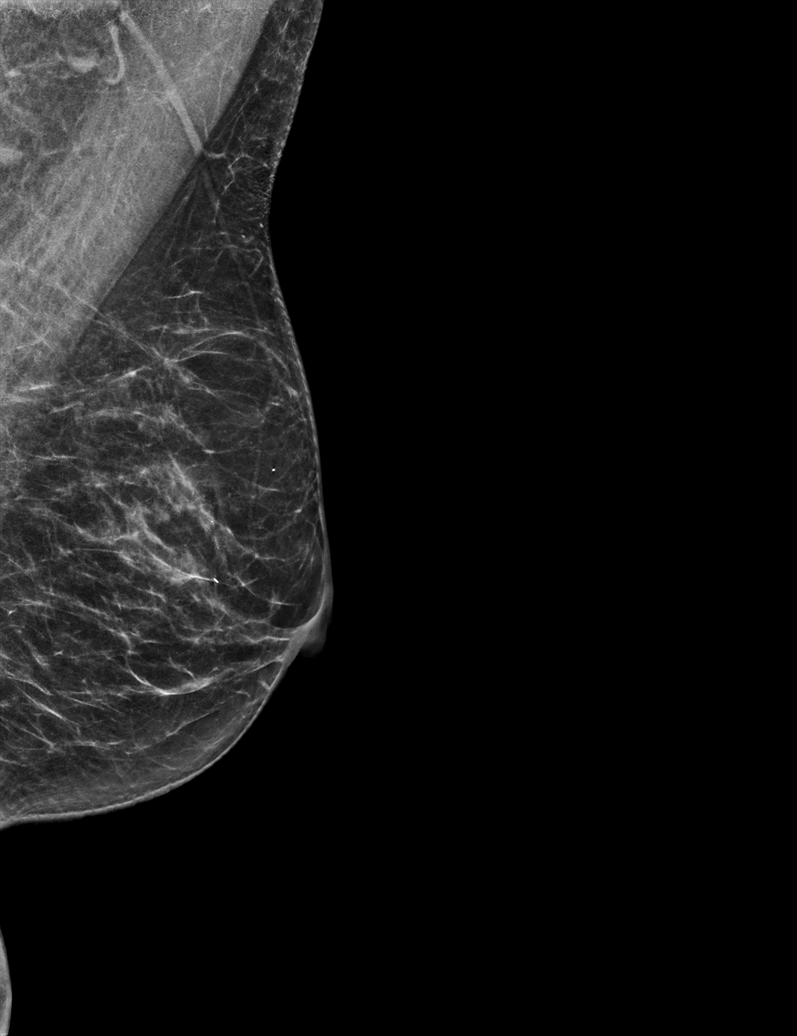

[L XCCL synth-2D]
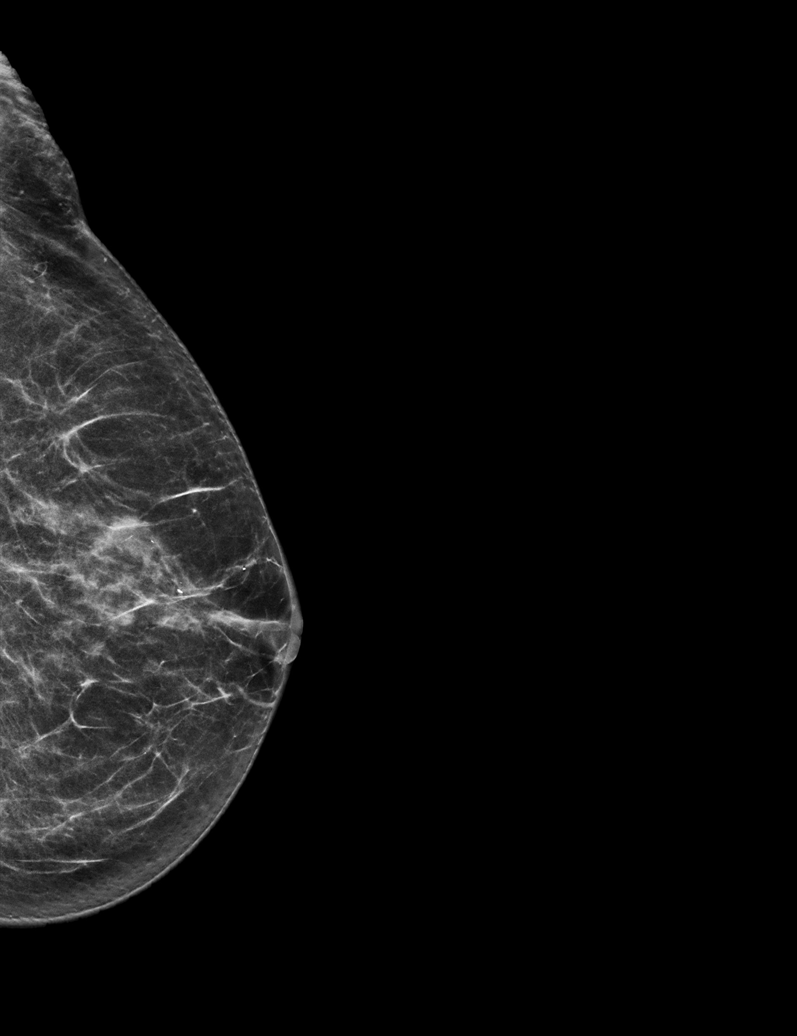

[R MLO synth-2D]
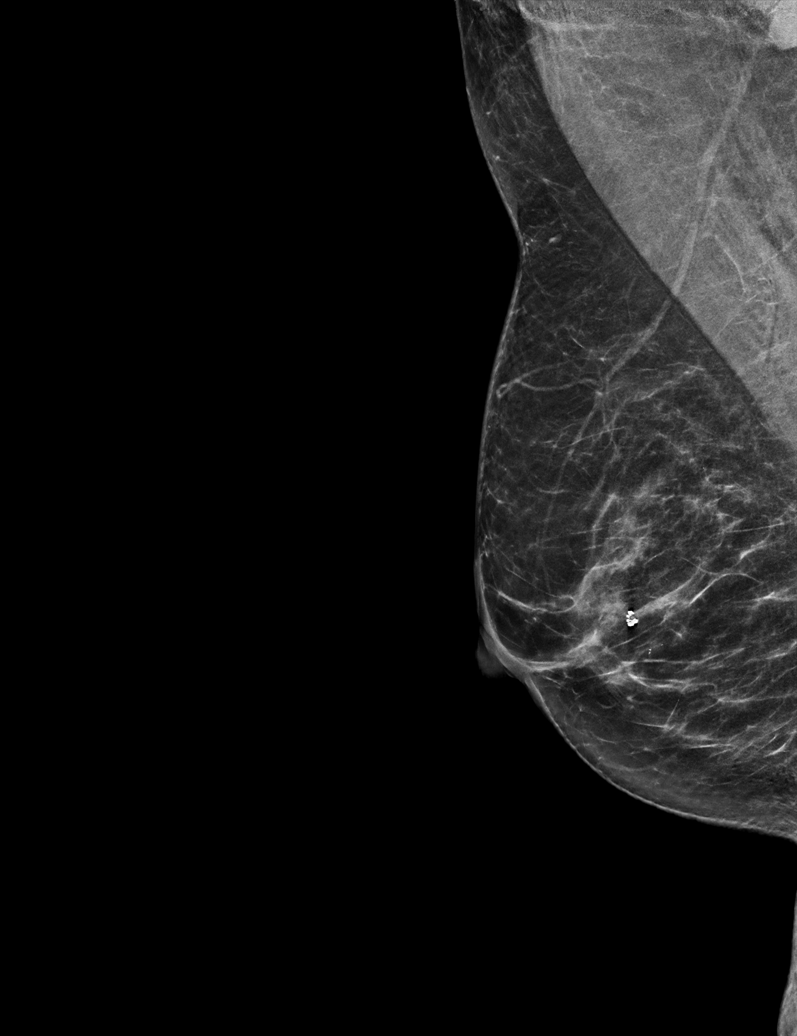

[R CC synth-2D]
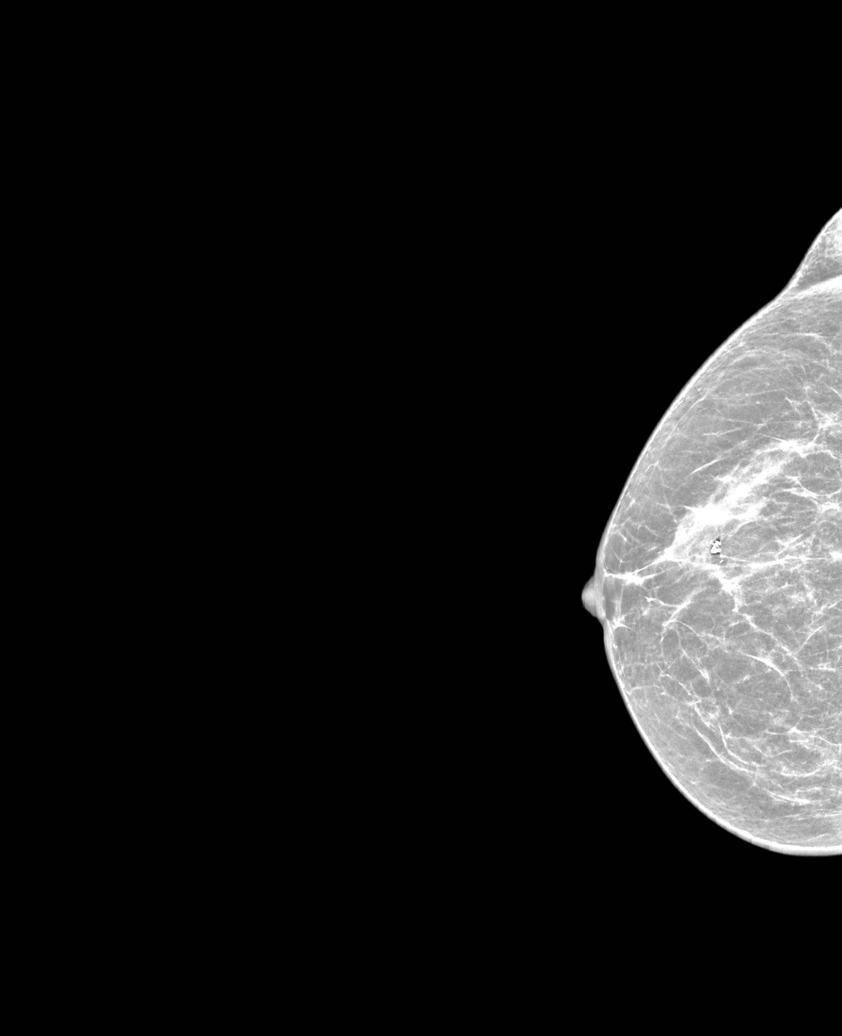

[L CC synth-2D]
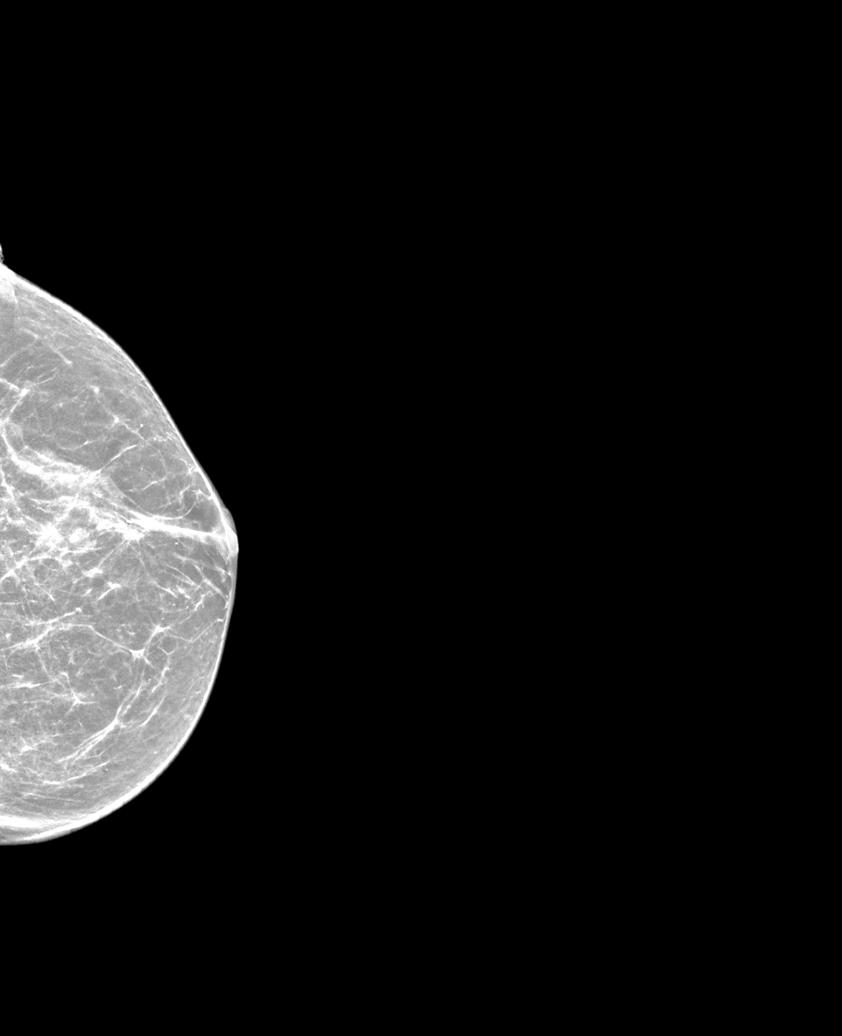

[L MLO tomo · tomo slice 31/61.0]
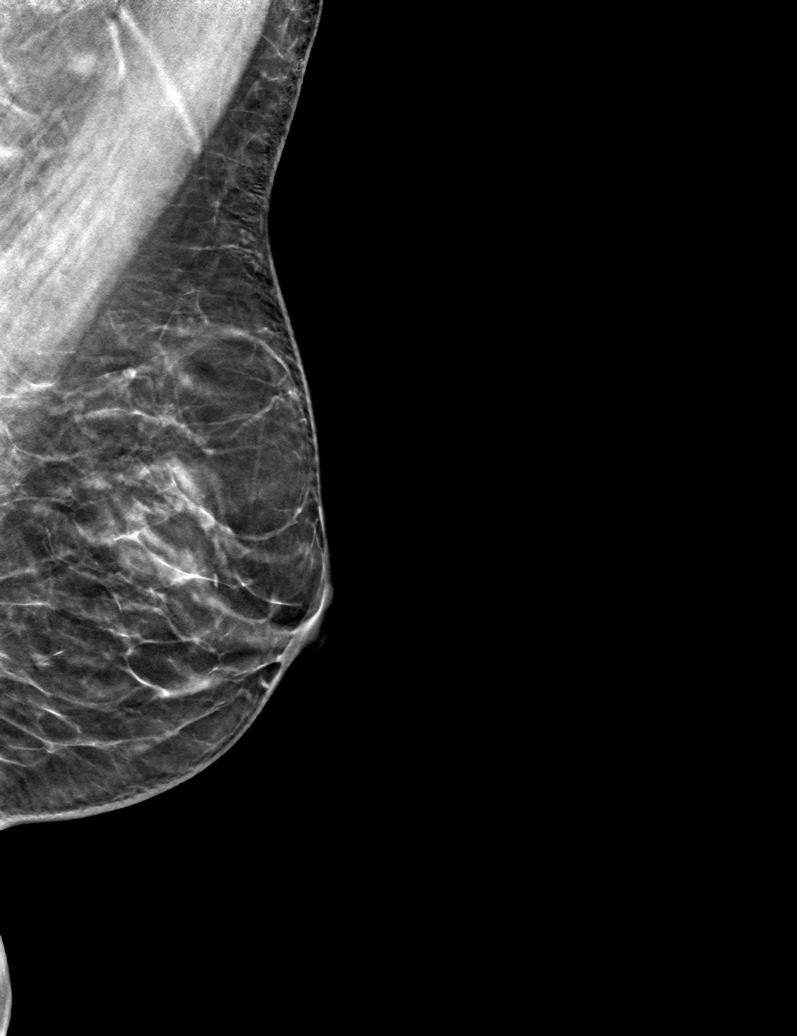

[6 of 30 positions shown; findings below may reference images not displayed]

ACR Breast Density Category b: There are scattered areas of
fibroglandular density.
FINDINGS: There are no findings suspicious for malignancy.
IMPRESSION: No mammographic evidence of malignancy. A result letter of this
screening mammogram will be mailed directly to the patient.

RECOMMENDATION:
Screening mammogram in one year. (Code:51-O-LD2)

BI-RADS CATEGORY  1: Negative.

## 2022-06-02 ENCOUNTER — Ambulatory Visit (INDEPENDENT_AMBULATORY_CARE_PROVIDER_SITE_OTHER): Payer: Medicare HMO | Admitting: Vascular Surgery

## 2022-06-02 ENCOUNTER — Encounter (INDEPENDENT_AMBULATORY_CARE_PROVIDER_SITE_OTHER): Payer: Self-pay | Admitting: Vascular Surgery

## 2022-06-02 VITALS — BP 118/73 | HR 61 | Resp 16 | Wt 135.6 lb

## 2022-06-02 DIAGNOSIS — I8311 Varicose veins of right lower extremity with inflammation: Secondary | ICD-10-CM | POA: Diagnosis not present

## 2022-06-02 DIAGNOSIS — I10 Essential (primary) hypertension: Secondary | ICD-10-CM | POA: Diagnosis not present

## 2022-06-02 DIAGNOSIS — I8312 Varicose veins of left lower extremity with inflammation: Secondary | ICD-10-CM | POA: Diagnosis not present

## 2022-06-02 DIAGNOSIS — E785 Hyperlipidemia, unspecified: Secondary | ICD-10-CM | POA: Diagnosis not present

## 2022-06-02 NOTE — Patient Instructions (Signed)

## 2022-06-02 NOTE — Assessment & Plan Note (Signed)
blood pressure control important in reducing the progression of atherosclerotic disease. On appropriate oral medications.  

## 2022-06-02 NOTE — Progress Notes (Signed)
Patient ID: Amanda Livingston, female   DOB: Aug 24, 1945, 77 y.o.   MRN: [id number]  Chief Complaint  Patient presents with   New Patient (Initial Visit)    Ref Abernathy consult bilateral spider veins    HPI Amanda Livingston is a 77 y.o. female.  I am asked to see the patient by Jonetta Osgood for evaluation of varicose veins.  The patient presents with complaints of symptomatic varicosities of the lower extremities. The patient reports a long standing history of varicosities and they have become painful over time. There was no clear inciting event or causative factor that started the symptoms.  The patient elevates the legs for relief. The pain is described as stinging and burning. The symptoms are generally most severe in the evening, particularly when they have been on their feet for long periods of time. The patient has worn compression stocking and elevation has been used to try to improve the symptoms with limited success. The patient has no previous history of deep venous thrombosis or superficial thrombophlebitis to their knowledge.     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[place])    thyroid    Past Surgical History:  Procedure Laterality Date   ABDOMINAL HYSTERECTOMY     CATARACT EXTRACTION W/PHACO Right 08/20/2021   Procedure: CATARACT EXTRACTION PHACO AND INTRAOCULAR LENS PLACEMENT ([facility]) RIGHT MiLOOP 3.10 00:40.8;  Surgeon: Leandrew Koyanagi, MD;  Location: [facility];  Service: Ophthalmology;  Laterality: Right;   CATARACT EXTRACTION W/PHACO Left 09/03/2021   Procedure:  CATARACT EXTRACTION PHACO AND INTRAOCULAR LENS PLACEMENT ([place]) LEFT;  Surgeon: Leandrew Koyanagi, MD;  Location: [facility];  Service: Ophthalmology;  Laterality: Left;  6.40 01:09.3   colonscopy  2015   CYSTOSCOPY W/ RETROGRADES Bilateral 10/31/2018   Procedure: CYSTOSCOPY WITH RETROGRADE PYELOGRAM;  Surgeon: Hollice Espy, MD;  Location: [facility];  Service: Urology;  Laterality: Bilateral;   CYSTOSCOPY WITH BIOPSY N/A 10/31/2018   Procedure: CYSTOSCOPY WITH Bladder BIOPSY;  Surgeon: Hollice Espy, MD;  Location: [facility];  Service: Urology;  Laterality: N/A;   ESOPHAGOGASTRODUODENOSCOPY (EGD) WITH PROPOFOL N/A 09/30/2017   Procedure: ESOPHAGOGASTRODUODENOSCOPY (EGD) WITH PROPOFOL;  Surgeon: Virgel Manifold, MD;  Location: [facility];  Service: Endoscopy;  Laterality: N/A;   NEPHRECTOMY Left    THYROIDECTOMY     TONSILLECTOMY Right     Family History  Problem Relation Age of Onset   Cancer Father   No bleeding or clotting disorders Mother had varicose veins   Social History   Tobacco Use   Smoking status: Former    Packs/day: 0.25    Types: Cigarettes    Quit date: 2004    Years since quitting: 20.0   Smokeless tobacco: Never  Vaping Use   Vaping Use: Never used  Substance Use Topics   Alcohol use: Not Currently    Alcohol/week: 7.0 standard drinks of alcohol    Types: 7 Glasses of wine per week    Comment: half a  glass full a few time a week. None since 05/2021   Drug use: No     Allergies  Allergen Reactions   Levaquin [Levofloxacin]     Made pt sick    Nitrofurantoin Rash    Current Outpatient Medications  Medication Sig Dispense Refill   Biotin 1000 MCG tablet Take 2,000 mcg by mouth daily.     Calcium Carbonate (CALCIUM 500 PO) Take by mouth daily.     Cholecalciferol (VITAMIN D3) 1000 units CAPS Take 1,000 Units by mouth every other day.      cloNIDine (CATAPRES) 0.1 MG tablet Take 1 tablet (0.1 mg total) by mouth 2 (two) times daily as  needed. 30 tablet 0   dimenhyDRINATE (DRAMAMINE) 50 MG tablet Take 25 mg by mouth at bedtime.     estradiol (ESTRACE) 0.5 MG tablet TAKE 1 TABLET BY MOUTH EVERY DAY 90 tablet 3   levothyroxine (SYNTHROID) 137 MCG tablet Take 137 mcg by mouth daily before breakfast.      losartan (COZAAR) 25 MG tablet Take 1 tablet (25 mg total) by mouth 2 (two) times daily. 180 tablet 4   melatonin 3 MG TABS tablet Take 3 mg by mouth at bedtime.     metoprolol succinate (TOPROL-XL) 25 MG 24 hr tablet Take 25 mg by mouth daily.     Probiotic Product (PROBIOTIC DAILY PO) Take by mouth.     tretinoin (RETIN-A) 0.025 % cream Apply 1 application. topically daily as needed. 20 g 6   vitamin C (ASCORBIC ACID) 500 MG tablet Take 500 mg by mouth daily.     No current facility-administered medications for this visit.      REVIEW OF SYSTEMS (Negative unless checked)  Constitutional: '[]'$ Weight loss  '[]'$ Fever  '[]'$ Chills Cardiac: '[]'$ Chest pain   '[]'$ Chest pressure   '[]'$ Palpitations   '[]'$ Shortness of breath when laying flat   '[]'$ Shortness of breath at rest   '[]'$ Shortness of breath with exertion. Vascular:  '[]'$ Pain in legs with walking   '[]'$ Pain in legs at rest   '[]'$ Pain in legs when laying flat   '[]'$ Claudication   '[]'$ Pain in feet when walking  '[]'$ Pain in feet at rest  '[]'$ Pain in feet when laying flat   '[]'$ History of DVT   '[]'$ Phlebitis   '[]'$ Swelling in legs   '[x]'$ Varicose veins   '[]'$ Non-healing ulcers Pulmonary:   '[]'$ Uses home oxygen   '[]'$ Productive cough   '[]'$ Hemoptysis   '[]'$ Wheeze  '[]'$ COPD   '[]'$ Asthma Neurologic:  '[]'$ Dizziness  '[]'$ Blackouts   '[]'$ Seizures   '[]'$ History of stroke   '[]'$ History of TIA  '[]'$ Aphasia   '[]'$ Temporary blindness   '[]'$ Dysphagia   '[]'$ Weakness or numbness in arms   '[]'$ Weakness or numbness in legs Musculoskeletal:  '[]'$ Arthritis   '[]'$ Joint swelling   '[]'$ Joint pain   '[]'$ Low back pain Hematologic:  '[]'$ Easy bruising  '[]'$ Easy bleeding   '[]'$ Hypercoagulable state   '[]'$ Anemic  '[]'$ Hepatitis Gastrointestinal:  '[]'$ Blood in stool   '[]'$ Vomiting blood   '[]'$ Gastroesophageal reflux/heartburn   '[]'$ Abdominal pain Genitourinary:  '[]'$ Chronic kidney disease   '[]'$ Difficult urination  '[]'$ Frequent urination  '[]'$ Burning with urination   '[]'$ Hematuria Skin:  '[]'$ Rashes   '[]'$ Ulcers   '[]'$ Wounds Psychological:  '[]'$ History of anxiety   '[]'$  History of major depression.    Physical Exam BP 118/73 (BP Location: Left Arm)   Pulse 61   Resp 16   Wt 135 lb 9.6 oz (61.5 kg)   BMI 23.64 kg/m  Gen:  WD/WN, NAD. Appears younger than stated age. Head: /AT, No temporalis wasting.  Ear/Nose/Throat: Hearing grossly intact, dentition good Eyes: Sclera non-icteric. Conjunctiva clear Neck: Supple. Trachea midline Pulmonary:  Good air movement, no use of accessory muscles, respirations not labored.  Cardiac: RRR, No JVD Vascular: Varicosities diffuse and measuring up to 1 mm in the right lower extremity        Varicosities diffuse and measuring up to 1-2 mm in the left lower extremity Vessel Right Left  Radial Palpable Palpable                          PT Palpable Palpable  DP Palpable Palpable   Gastrointestinal: soft, non-tender/non-distended.  Musculoskeletal: M/S 5/5 throughout.   No significant LE edema Neurologic: Sensation grossly intact in extremities.  Symmetrical.  Speech is fluent.  Psychiatric: Judgment intact, Mood & affect appropriate for pt's clinical situation. Dermatologic: No rashes or ulcers noted.  No cellulitis or open wounds.    Radiology No results found.  Labs Recent Results (from the past 2160 hour(s))  UA/M w/rflx Culture, Routine     Status: None   Collection Time: 04/21/22  3:56 PM   Specimen: Urine   Urine  Result Value Ref Range   Specific Gravity, UA 1.014 1.005 - 1.030   pH, UA 6.0 5.0 - 7.5   Color, UA Yellow Yellow   Appearance Ur Clear Clear   Leukocytes,UA Negative Negative   Protein,UA Negative Negative/Trace   Glucose, UA Negative Negative   Ketones, UA Negative Negative   RBC, UA Negative Negative   Bilirubin,  UA Negative Negative   Urobilinogen, Ur 0.2 0.2 - 1.0 mg/dL   Nitrite, UA Negative Negative   Microscopic Examination Comment     Comment: Microscopic follows if indicated.   Microscopic Examination See below:     Comment: Microscopic was indicated and was performed.   Urinalysis Reflex Comment     Comment: This specimen will not reflex to a Urine Culture.  Microscopic Examination     Status: Abnormal   Collection Time: 04/21/22  3:56 PM   Urine  Result Value Ref Range   WBC, UA 0-5 0 - 5 /hpf   RBC, Urine 0-2 0 - 2 /hpf   Epithelial Cells (non renal) >10 (A) 0 - 10 /hpf   Casts None seen None seen /lpf   Bacteria, UA None seen None seen/Few    Assessment/Plan:  Essential hypertension, benign blood pressure control important in reducing the progression of atherosclerotic disease. On appropriate oral medications.   Hyperlipidemia Diet controlled  Varicose veins of both lower extremities with inflammation   The patient has symptoms consistent with chronic venous insufficiency. We discussed the natural history and treatment options for venous disease. I recommended the regular use of 20 - 30 mm Hg compression stockings, and prescribed these today. I recommended leg elevation and anti-inflammatories as needed for pain. I have also recommended a complete venous duplex to assess the venous system for reflux or thrombotic issues. This can be done at the patient's convenience. I will see the patient back after her duplex to assess the response to conservative management, and determine further treatment options.     Leotis Pain 06/02/2022, 3:05 PM   This note was created with Dragon medical transcription system.  Any errors from dictation are unintentional.

## 2022-06-02 NOTE — Assessment & Plan Note (Signed)
Diet controlled.  

## 2022-06-17 ENCOUNTER — Ambulatory Visit: Payer: Self-pay

## 2022-06-17 ENCOUNTER — Other Ambulatory Visit: Payer: Self-pay

## 2022-06-17 ENCOUNTER — Encounter: Payer: Self-pay | Admitting: Emergency Medicine

## 2022-06-17 ENCOUNTER — Ambulatory Visit
Admission: EM | Admit: 2022-06-17 | Discharge: 2022-06-17 | Disposition: A | Discharge status: Home / Self Care | Payer: Medicare HMO | Attending: Urgent Care | Admitting: Urgent Care

## 2022-06-17 DIAGNOSIS — I340 Nonrheumatic mitral (valve) insufficiency: Secondary | ICD-10-CM | POA: Diagnosis not present

## 2022-06-17 DIAGNOSIS — J3489 Other specified disorders of nose and nasal sinuses: Secondary | ICD-10-CM | POA: Insufficient documentation

## 2022-06-17 DIAGNOSIS — I6523 Occlusion and stenosis of bilateral carotid arteries: Secondary | ICD-10-CM | POA: Diagnosis not present

## 2022-06-17 DIAGNOSIS — I771 Stricture of artery: Secondary | ICD-10-CM | POA: Diagnosis not present

## 2022-06-17 DIAGNOSIS — Z20822 Contact with and (suspected) exposure to covid-19: Secondary | ICD-10-CM | POA: Diagnosis not present

## 2022-06-17 DIAGNOSIS — I779 Disorder of arteries and arterioles, unspecified: Secondary | ICD-10-CM | POA: Diagnosis not present

## 2022-06-17 NOTE — Discharge Instructions (Addendum)
Follow up here or with your primary care provider if your symptoms are worsening or not improving.     

## 2022-06-17 NOTE — ED Provider Notes (Signed)
[facility]    CSN: [id number] Arrival date & time: 06/17/22  1055      History   Chief Complaint Chief Complaint  Patient presents with   covid test    HPI Amanda Livingston is a 77 y.o. female.   HPI Patient presents to urgent care with rhinorrhea Starting 4 days ago.  She states that she was exposed to someone with COVID last week.  She performed at home COVID test which was negative, however test was expired.  Requesting PCR testing today.   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   Thyroid cancer [facility])    thyroid    Patient Active Problem List   Diagnosis Date Noted   Vitamin D deficiency 01/09/2021   Encounter for general adult medical examination with abnormal findings 04/18/2020   Varicose veins of both lower extremities with inflammation 04/18/2020   Ovarian failure 04/18/2020   Encounter for screening mammogram for malignant neoplasm of breast 04/18/2020   Occlusion and stenosis of bilateral carotid arteries 01/14/2020   Pain in joint of right ankle 12/20/2019   Lipoma of skin of abdomen 09/24/2019   Hypothyroidism, postop 07/11/2019   Other long term (current) drug therapy 07/11/2019   Personal history of malignant neoplasm of thyroid 07/11/2019   Atypical chest pain 02/22/2019   Non-intractable vomiting 10/24/2018   Unspecified menopausal and perimenopausal disorder 10/24/2018   Tobacco use 09/28/2018   Urinary tract infection with hematuria 05/25/2018   Dysuria 05/25/2018   Vaginal itching 04/28/2018   Bacterial vaginitis 04/28/2018   Vaginal yeast infection 04/28/2018    Nonrheumatic mitral valve regurgitation 03/07/2018   Premature atrial contractions 12/02/2017   Non-ulcerative nasal mucositis 10/18/2017   Hyperpigmentation 10/18/2017   Reflux esophagitis    Thickening of esophagus    Stomach irritation    Hiatal hernia    Heartburn    Hypothyroidism 06/14/2017   Nicotine dependence, cigarettes, uncomplicated 16/60/6301   Palpitations 04/26/2017   Hyperlipidemia 04/26/2017   Incomplete emptying of bladder 10/06/2016   Premature ventricular contractions 07/24/2016   Left ventricular diastolic dysfunction 60/02/9322   Essential hypertension, benign 02/21/2016   Single kidney 10/05/2014   Chronic cystitis 10/05/2014   Increased frequency of urination 10/05/2014    Past Surgical History:  Procedure Laterality Date   ABDOMINAL HYSTERECTOMY     CATARACT EXTRACTION W/PHACO Right 08/20/2021   Procedure: CATARACT EXTRACTION PHACO AND INTRAOCULAR LENS PLACEMENT ([place]) RIGHT MiLOOP 3.10 00:40.8;  Surgeon: Leandrew Koyanagi, MD;  Location: [place];  Service: Ophthalmology;  Laterality: Right;   CATARACT EXTRACTION W/PHACO Left 09/03/2021   Procedure: CATARACT EXTRACTION PHACO AND INTRAOCULAR LENS PLACEMENT ([place]) LEFT;  Surgeon: Leandrew Koyanagi, MD;  Location: [place];  Service: Ophthalmology;  Laterality: Left;  6.40 01:09.3   colonscopy  2015   CYSTOSCOPY W/ RETROGRADES Bilateral 10/31/2018   Procedure: CYSTOSCOPY WITH RETROGRADE PYELOGRAM;  Surgeon: Hollice Espy, MD;  Location: [facility];  Service: Urology;  Laterality: Bilateral;   CYSTOSCOPY WITH BIOPSY N/A 10/31/2018  Procedure: CYSTOSCOPY WITH Bladder BIOPSY;  Surgeon: Hollice Espy, MD;  Location: [facility];  Service: Urology;  Laterality: N/A;   ESOPHAGOGASTRODUODENOSCOPY (EGD) WITH PROPOFOL N/A 09/30/2017   Procedure: ESOPHAGOGASTRODUODENOSCOPY (EGD) WITH PROPOFOL;  Surgeon: Virgel Manifold, MD;  Location: [facility];  Service: Endoscopy;  Laterality: N/A;    NEPHRECTOMY Left    THYROIDECTOMY     TONSILLECTOMY Right     OB History   No obstetric history on file.      Home Medications    Prior to Admission medications   Medication Sig Start Date End Date Taking? Authorizing Provider  Biotin 1000 MCG tablet Take 2,000 mcg by mouth daily.    [provider]  Calcium Carbonate (CALCIUM 500 PO) Take by mouth daily.    [provider]  Cholecalciferol (VITAMIN D3) 1000 units CAPS Take 1,000 Units by mouth every other day.     [provider]  cloNIDine (CATAPRES) 0.1 MG tablet Take 1 tablet (0.1 mg total) by mouth 2 (two) times daily as needed. 09/03/15   Daymon Larsen, MD  dimenhyDRINATE (DRAMAMINE) 50 MG tablet Take 25 mg by mouth at bedtime.    [provider]  estradiol (ESTRACE) 0.5 MG tablet TAKE 1 TABLET BY MOUTH EVERY DAY 02/19/22   Lavera Guise, MD  levothyroxine (SYNTHROID) 137 MCG tablet Take 137 mcg by mouth daily before breakfast.  12/30/15   [provider]  losartan (COZAAR) 25 MG tablet Take 1 tablet (25 mg total) by mouth 2 (two) times daily. 12/11/19   Ronnell Freshwater, NP  melatonin 3 MG TABS tablet Take 3 mg by mouth at bedtime.    [provider]  metoprolol succinate (TOPROL-XL) 25 MG 24 hr tablet Take 25 mg by mouth daily.    [provider]  Probiotic Product (PROBIOTIC DAILY PO) Take by mouth.    [provider]  tretinoin (RETIN-A) 0.025 % cream Apply 1 application. topically daily as needed. 08/03/21   Jonetta Osgood, NP  vitamin C (ASCORBIC ACID) 500 MG tablet Take 500 mg by mouth daily.    [provider]    Family History Family History  Problem Relation Age of Onset   Cancer Father     Social History Social History   Tobacco Use   Smoking status: Former    Packs/day: 0.25    Types: Cigarettes    Quit date: 2004    Years since quitting: 20.1   Smokeless tobacco: Never  Vaping Use   Vaping Use: Never used  Substance  Use Topics   Alcohol use: Not Currently    Alcohol/week: 7.0 standard drinks of alcohol    Types: 7 Glasses of wine per week    Comment: half a glass full a few time a week. None since 05/2021   Drug use: No     Allergies   Levaquin [levofloxacin] and Nitrofurantoin   Review of Systems Review of Systems   Physical Exam Triage Vital Signs ED Triage Vitals  Enc Vitals Group     BP 06/17/22 1109 132/70     Pulse Rate 06/17/22 1109 60     Resp 06/17/22 1109 18     Temp 06/17/22 1109 98 F (36.7 C)     Temp Source 06/17/22 1109 Oral     SpO2 06/17/22 1109 96 %     Weight --      Height --      Head Circumference --  Peak Flow --      Pain Score 06/17/22 1106 0     Pain Loc --      Pain Edu? --      Excl. in [place]? --    No data found.  Updated Vital Signs BP 132/70 (BP Location: Left Arm)   Pulse 60   Temp 98 F (36.7 C) (Oral)   Resp 18   SpO2 96%   Visual Acuity Right Eye Distance:   Left Eye Distance:   Bilateral Distance:    Right Eye Near:   Left Eye Near:    Bilateral Near:     Physical Exam Vitals reviewed.  Constitutional:      Appearance: Normal appearance.  Cardiovascular:     Rate and Rhythm: Normal rate and regular rhythm.     Pulses: Normal pulses.     Heart sounds: Normal heart sounds.  Pulmonary:     Effort: Pulmonary effort is normal.     Breath sounds: Normal breath sounds.  Skin:    General: Skin is warm and dry.  Neurological:     General: No focal deficit present.     Mental Status: She is alert and oriented to person, place, and time.  Psychiatric:        Mood and Affect: Mood normal.        Behavior: Behavior normal.      UC Treatments / Results  Labs (all labs ordered are listed, but only abnormal results are displayed) Labs Reviewed - No data to display  EKG   Radiology No results found.  Procedures Procedures (including critical care time)  Medications Ordered in UC Medications - No data to  display  Initial Impression / Assessment and Plan / UC Course  I have reviewed the triage vital signs and the nursing notes.  Pertinent labs & imaging results that were available during my care of the patient were reviewed by me and considered in my medical decision making (see chart for details).   Patient is afebrile here without recent antipyretics. Satting well on room air. Overall is well appearing, well hydrated, without respiratory distress. Pulmonary exam is unremarkable.  Lungs CTAB without wheezing, rhonchi, rales.  Patient's symptoms are consistent with an acute viral process.  She requests rule out COVID which we performed today.  Final Clinical Impressions(s) / UC Diagnoses   Final diagnoses:  None   Discharge Instructions   None    ED Prescriptions   None    PDMP not reviewed this encounter.   Rose Phi, FNP 06/17/22 1140

## 2022-06-17 NOTE — ED Triage Notes (Signed)
Patient has a runny nose, and was exposed to someone that has covid, last week. Runny nose reported started 4 days ago (06/14/2022)  An expired home test was negative. Requesting pcr test today

## 2022-06-18 LAB — SARS CORONAVIRUS 2 (TAT 6-24 HRS): SARS Coronavirus 2: NEGATIVE

## 2022-07-09 ENCOUNTER — Encounter: Payer: Self-pay | Admitting: Physician Assistant

## 2022-07-09 ENCOUNTER — Telehealth (INDEPENDENT_AMBULATORY_CARE_PROVIDER_SITE_OTHER): Payer: Medicare HMO | Admitting: Physician Assistant

## 2022-07-09 VITALS — Resp 16

## 2022-07-09 DIAGNOSIS — J0110 Acute frontal sinusitis, unspecified: Secondary | ICD-10-CM | POA: Diagnosis not present

## 2022-07-09 MED ORDER — AZITHROMYCIN 250 MG PO TABS: ORAL_TABLET | ORAL | 0 refills | Status: AC

## 2022-07-09 NOTE — Progress Notes (Signed)
[facility] [place], [place] [address]  Internal MEDICINE  Telephone Visit  Patient Name: Amanda Livingston  [id number]  [id number]  Date of Service: 07/09/2022  I connected with the patient at 2:14 by telephone and verified the patients identity using two identifiers.   I discussed the limitations, risks, security and privacy concerns of performing an evaluation and management service by telephone and the availability of in person appointments. I also discussed with the patient that there may be a patient responsible charge related to the service.  The patient expressed understanding and agrees to proceed.    Chief Complaint  Patient presents with   Telephone Assessment    [phone]   Telephone Screen   Sinusitis    Negative for Covid. Having yellow mucus.    HPI Pt is here for virtual sick visit -Started off with cold symptoms last Thursday and has continued all week -Sinus congestion, yellow mucus production, productive cough, hoarse voice, and runny nose.  -No fever or chills. No wheezing or SOB. -Did take mucinex once, but didn't continue since didn't feel any chest congestion--just sinus. Started nasal spray yesterday. Discussed continuing mucinex to help sinus congestion along with spray -neg for covid a few weeks ago, but hasn't tested again  Current Medication: Outpatient Encounter Medications as of 07/09/2022  Medication Sig   azithromycin (ZITHROMAX) 250 MG tablet Take 2 tablets on day 1, then 1 tablet daily on days 2 through 5   Biotin 1000 MCG tablet Take 2,000 mcg by mouth daily.   Calcium Carbonate (CALCIUM 500 PO) Take by mouth daily.   Cholecalciferol (VITAMIN D3) 1000 units CAPS Take 1,000 Units by mouth every other day.    cloNIDine (CATAPRES) 0.1 MG tablet Take 1 tablet (0.1 mg total) by mouth 2 (two) times daily as needed.   dimenhyDRINATE (DRAMAMINE) 50 MG tablet Take 25 mg by mouth at bedtime.   estradiol (ESTRACE) 0.5 MG tablet  TAKE 1 TABLET BY MOUTH EVERY DAY   levothyroxine (SYNTHROID) 137 MCG tablet Take 137 mcg by mouth daily before breakfast.    losartan (COZAAR) 25 MG tablet Take 1 tablet (25 mg total) by mouth 2 (two) times daily.   melatonin 3 MG TABS tablet Take 3 mg by mouth at bedtime.   metoprolol succinate (TOPROL-XL) 25 MG 24 hr tablet Take 25 mg by mouth daily.   Probiotic Product (PROBIOTIC DAILY PO) Take by mouth.   tretinoin (RETIN-A) 0.025 % cream Apply 1 application. topically daily as needed.   vitamin C (ASCORBIC ACID) 500 MG tablet Take 500 mg by mouth daily.   No facility-administered encounter medications on file as of 07/09/2022.    Surgical History: Past Surgical History:  Procedure Laterality Date   ABDOMINAL HYSTERECTOMY     CATARACT EXTRACTION W/PHACO Right 08/20/2021   Procedure: CATARACT EXTRACTION PHACO AND INTRAOCULAR LENS PLACEMENT ([facility]) RIGHT MiLOOP 3.10 00:40.8;  Surgeon: Leandrew Koyanagi, MD;  Location: [place];  Service: Ophthalmology;  Laterality: Right;   CATARACT EXTRACTION W/PHACO Left 09/03/2021   Procedure: CATARACT EXTRACTION PHACO AND INTRAOCULAR LENS PLACEMENT ([place]) LEFT;  Surgeon: Leandrew Koyanagi, MD;  Location: [place];  Service: Ophthalmology;  Laterality: Left;  6.40 01:09.3   colonscopy  2015   CYSTOSCOPY W/ RETROGRADES Bilateral 10/31/2018   Procedure: CYSTOSCOPY WITH RETROGRADE PYELOGRAM;  Surgeon: Hollice Espy, MD;  Location: [facility];  Service: Urology;  Laterality: Bilateral;   CYSTOSCOPY WITH BIOPSY N/A 10/31/2018   Procedure: CYSTOSCOPY WITH Bladder BIOPSY;  Surgeon: Hollice Espy, MD;  Location: [facility];  Service: Urology;  Laterality: N/A;   ESOPHAGOGASTRODUODENOSCOPY (EGD) WITH PROPOFOL N/A 09/30/2017   Procedure: ESOPHAGOGASTRODUODENOSCOPY (EGD) WITH PROPOFOL;  Surgeon: Virgel Manifold, MD;  Location: [facility];  Service: Endoscopy;  Laterality: N/A;   NEPHRECTOMY Left    THYROIDECTOMY     TONSILLECTOMY  Right     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[place])    thyroid    Family History: Family History  Problem Relation Age of Onset   Cancer Father     Social History   Socioeconomic History   Marital status: Divorced    Spouse name: Not on file   Number of children: Not on file   Years of education: Not on file   Highest education level: Not on file  Occupational History   Not on file  Tobacco Use   Smoking status: Former    Packs/day: 0.25    Types: Cigarettes    Quit date: 2004    Years since quitting: 20.1   Smokeless tobacco: Never  Vaping Use   Vaping Use: Never used  Substance and Sexual Activity   Alcohol use: Not Currently    Alcohol/week: 7.0 standard drinks of alcohol    Types: 7 Glasses of wine per week    Comment: half a glass full a few time a week. None since 05/2021   Drug use: No   Sexual activity: Not on file  Other Topics Concern   Not on file  Social History Narrative   Not on file   Social Determinants of Health   Financial Resource Strain: Not on file  Food Insecurity: Not on file  Transportation Needs: Not on file  Physical Activity: Not on file  Stress: Not on file  Social Connections: Not on file  Intimate Partner Violence: Not on file      Review of Systems  Constitutional:  Negative for fatigue and fever.  HENT:  Positive for congestion, postnasal drip, sinus pressure, sore throat and voice change. Negative for mouth sores.   Respiratory:  Positive for cough. Negative for shortness of breath and wheezing.    Cardiovascular:  Negative for chest pain.  Genitourinary:  Negative for flank pain.  Psychiatric/Behavioral: Negative.      Vital Signs: Resp 16    Observation/Objective:  Pt is able to carry out conversation   Assessment/Plan: 1. Acute non-recurrent frontal sinusitis Will start zpak and continue mucinex and nasal spray. Rest and stay well hydrated - azithromycin (ZITHROMAX) 250 MG tablet; Take 2 tablets on day 1, then 1 tablet daily on days 2 through 5  Dispense: 6 tablet; Refill: 0   General Counseling: teneisha shuler understanding of the findings of today's phone visit and agrees with plan of treatment. I have discussed any further diagnostic evaluation that may be needed or ordered today. We also reviewed her medications today. she has been encouraged to call the office with any questions or concerns that  should arise related to todays visit.    No orders of the defined types were placed in this encounter.   Meds ordered this encounter  Medications   azithromycin (ZITHROMAX) 250 MG tablet    Sig: Take 2 tablets on day 1, then 1 tablet daily on days 2 through 5    Dispense:  6 tablet    Refill:  0    Time spent:25 Minutes    Dr Lavera Guise Internal medicine

## 2022-07-14 DIAGNOSIS — C73 Malignant neoplasm of thyroid gland: Secondary | ICD-10-CM | POA: Diagnosis not present

## 2022-07-14 DIAGNOSIS — E890 Postprocedural hypothyroidism: Secondary | ICD-10-CM | POA: Diagnosis not present

## 2022-08-06 ENCOUNTER — Encounter: Payer: Self-pay | Admitting: Physician Assistant

## 2022-08-06 ENCOUNTER — Ambulatory Visit (INDEPENDENT_AMBULATORY_CARE_PROVIDER_SITE_OTHER): Payer: Medicare HMO | Admitting: Physician Assistant

## 2022-08-06 VITALS — BP 127/76 | HR 68 | Temp 97.4°F | Resp 16 | Ht 63.5 in | Wt 136.2 lb

## 2022-08-06 DIAGNOSIS — E782 Mixed hyperlipidemia: Secondary | ICD-10-CM | POA: Diagnosis not present

## 2022-08-06 DIAGNOSIS — M19042 Primary osteoarthritis, left hand: Secondary | ICD-10-CM | POA: Diagnosis not present

## 2022-08-06 DIAGNOSIS — R300 Dysuria: Secondary | ICD-10-CM | POA: Diagnosis not present

## 2022-08-06 DIAGNOSIS — R5383 Other fatigue: Secondary | ICD-10-CM

## 2022-08-06 DIAGNOSIS — E559 Vitamin D deficiency, unspecified: Secondary | ICD-10-CM | POA: Diagnosis not present

## 2022-08-06 DIAGNOSIS — E538 Deficiency of other specified B group vitamins: Secondary | ICD-10-CM | POA: Diagnosis not present

## 2022-08-06 DIAGNOSIS — M19041 Primary osteoarthritis, right hand: Secondary | ICD-10-CM

## 2022-08-06 DIAGNOSIS — M130 Polyarthritis, unspecified: Secondary | ICD-10-CM

## 2022-08-06 NOTE — Progress Notes (Signed)
[facility] [place], [place] [address]  Internal MEDICINE  Office Visit Note  Patient Name: Amanda Livingston  [id number]  [id number]  Date of Service: 08/06/2022  Chief Complaint  Patient presents with   Hypertension   Follow-up    HPI Pt is here for routine follow up -Would like to have updated labs, does not need thyroid labs as she sees another provider for this -OA in many joints, some soreness in arms as well. Will take tylenol but doesn't want to take all the time if avoidable. Cannot take NSAIDs due to kidney. Is avoiding voltaren for this reason as well, though less absorption -Would like to recheck urine while here as well, no current symptoms  Current Medication: Outpatient Encounter Medications as of 08/06/2022  Medication Sig   Biotin 1000 MCG tablet Take 2,000 mcg by mouth daily.   Calcium Carbonate (CALCIUM 500 PO) Take by mouth daily.   Cholecalciferol (VITAMIN D3) 1000 units CAPS Take 1,000 Units by mouth every other day.    cloNIDine (CATAPRES) 0.1 MG tablet Take 1 tablet (0.1 mg total) by mouth 2 (two) times daily as needed.   dimenhyDRINATE (DRAMAMINE) 50 MG tablet Take 25 mg by mouth at bedtime.   estradiol (ESTRACE) 0.5 MG tablet TAKE 1 TABLET BY MOUTH EVERY DAY   levothyroxine (SYNTHROID) 137 MCG tablet Take 137 mcg by mouth daily before breakfast.    losartan (COZAAR) 25 MG tablet Take 1 tablet (25 mg total) by mouth 2 (two) times daily.   melatonin 3 MG TABS tablet Take 3 mg by mouth at bedtime.   metoprolol succinate (TOPROL-XL) 25 MG 24 hr tablet Take 25 mg by mouth daily.   Probiotic Product (PROBIOTIC DAILY PO) Take by mouth.   tretinoin (RETIN-A) 0.025 % cream Apply 1 application. topically daily as needed.   vitamin C (ASCORBIC ACID) 500 MG tablet Take 500 mg by mouth daily.   No facility-administered encounter medications on file as of 08/06/2022.    Surgical History: Past Surgical History:  Procedure Laterality Date    ABDOMINAL HYSTERECTOMY     CATARACT EXTRACTION W/PHACO Right 08/20/2021   Procedure: CATARACT EXTRACTION PHACO AND INTRAOCULAR LENS PLACEMENT ([facility]) RIGHT MiLOOP 3.10 00:40.8;  Surgeon: Leandrew Koyanagi, MD;  Location: [place];  Service: Ophthalmology;  Laterality: Right;   CATARACT EXTRACTION W/PHACO Left 09/03/2021   Procedure: CATARACT EXTRACTION PHACO AND INTRAOCULAR LENS PLACEMENT ([place]) LEFT;  Surgeon: Leandrew Koyanagi, MD;  Location: [place];  Service: Ophthalmology;  Laterality: Left;  6.40 01:09.3   colonscopy  2015   CYSTOSCOPY W/ RETROGRADES Bilateral 10/31/2018   Procedure: CYSTOSCOPY WITH RETROGRADE PYELOGRAM;  Surgeon: Hollice Espy, MD;  Location: [facility];  Service: Urology;  Laterality: Bilateral;   CYSTOSCOPY WITH BIOPSY N/A 10/31/2018   Procedure: CYSTOSCOPY WITH Bladder BIOPSY;  Surgeon: Hollice Espy, MD;  Location: [facility];  Service: Urology;  Laterality: N/A;   ESOPHAGOGASTRODUODENOSCOPY (EGD) WITH PROPOFOL N/A 09/30/2017   Procedure: ESOPHAGOGASTRODUODENOSCOPY (EGD) WITH PROPOFOL;  Surgeon: Virgel Manifold, MD;  Location: [facility];  Service: Endoscopy;  Laterality: N/A;   NEPHRECTOMY Left    THYROIDECTOMY     TONSILLECTOMY Right     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[place])    thyroid    Family History: Family History  Problem Relation Age of Onset   Cancer Father     Social History   Socioeconomic History   Marital status: Divorced    Spouse name: Not on file   Number of children: Not on file   Years  of education: Not on file   Highest education level: Not on file  Occupational History   Not on file  Tobacco Use   Smoking status: Former    Packs/day: .25    Types: Cigarettes    Quit date: 2004    Years since quitting: 20.2   Smokeless tobacco: Never  Vaping Use   Vaping Use: Never used  Substance and Sexual Activity   Alcohol use: Not Currently    Alcohol/week: 7.0 standard drinks of alcohol    Types: 7 Glasses of wine per week    Comment: half a glass full a few time a week. None since 05/2021   Drug use: No   Sexual activity: Not on file  Other Topics Concern   Not on file  Social History Narrative   Not on file   Social Determinants of Health   Financial Resource Strain: Not on file  Food Insecurity: Not on file  Transportation Needs: Not on file  Physical Activity: Not on file  Stress: Not on file  Social Connections: Not on file  Intimate Partner Violence: Not on file      Review of Systems  Constitutional:  Negative for chills, fatigue and unexpected weight change.  HENT:  Negative for congestion, postnasal drip, rhinorrhea, sneezing and sore throat.   Eyes:  Negative for redness.  Respiratory:  Negative for cough, chest tightness and shortness of breath.   Cardiovascular:  Negative for chest pain and palpitations.  Gastrointestinal:  Negative for abdominal pain, constipation, diarrhea, nausea and vomiting.  Genitourinary:  Negative for dysuria and frequency.  Musculoskeletal:  Positive for arthralgias and myalgias. Negative for back pain, joint swelling and neck pain.  Skin:  Negative for rash.  Neurological: Negative.  Negative for tremors and numbness.  Hematological:  Negative for adenopathy. Does not bruise/bleed easily.  Psychiatric/Behavioral:  Negative for behavioral problems (Depression), sleep disturbance and suicidal ideas. The patient is not nervous/anxious.     Vital Signs: BP 127/76   Pulse 68   Temp (!) 97.4 F (36.3 C)   Resp 16   Ht  5' 3.5" (1.613 m)   Wt 136 lb 3.2 oz (61.8 kg)   SpO2 98%   BMI 23.75 kg/m    Physical Exam Constitutional:      Appearance: Normal appearance.  HENT:     Head: Normocephalic and atraumatic.     Nose: Nose normal.     Mouth/Throat:     Mouth: Mucous membranes are moist.     Pharynx: No posterior oropharyngeal erythema.  Eyes:     Extraocular Movements: Extraocular movements intact.     Pupils: Pupils are equal, round, and reactive to light.  Cardiovascular:     Rate and Rhythm: Normal rate and regular rhythm.     Pulses: Normal pulses.     Heart sounds: Normal heart sounds.  Pulmonary:     Effort: Pulmonary  effort is normal.     Breath sounds: Normal breath sounds.  Musculoskeletal:        General: Swelling present.     Comments: Swelling and stiffness in fingers  Neurological:     General: No focal deficit present.     Mental Status: She is alert.  Psychiatric:        Mood and Affect: Mood normal.        Behavior: Behavior normal.        Assessment/Plan: 1. Primary osteoarthritis of both hands May continue tylenol and will check labs for any autoimmune causes in addition to OA - Rheumatoid Factor - Sed Rate (ESR) - ANA w/Reflex if Positive  2. Polyarthritis of multiple sites - Rheumatoid Factor - Sed Rate (ESR) - ANA w/Reflex if Positive  3. Vitamin D deficiency - VITAMIN D 25 Hydroxy (Vit-D Deficiency, Fractures)  4. B12 deficiency - B12 and Folate Panel  5. Mixed hyperlipidemia - Lipid Panel With LDL/HDL Ratio  6. Other fatigue - CBC w/Diff/Platelet - Comprehensive metabolic panel - Lipid Panel With LDL/HDL Ratio - VITAMIN D 25 Hydroxy (Vit-D Deficiency, Fractures) - B12 and Folate Panel - Rheumatoid Factor - Sed Rate (ESR) - ANA w/Reflex if Positive  7. Dysuria - Urinalysis, Routine w reflex microscopic   General Counseling: alexine hallam understanding of the findings of todays visit and agrees with plan of treatment. I have  discussed any further diagnostic evaluation that may be needed or ordered today. We also reviewed her medications today. she has been encouraged to call the office with any questions or concerns that should arise related to todays visit.    Orders Placed This Encounter  Procedures   CBC w/Diff/Platelet   Comprehensive metabolic panel   Lipid Panel With LDL/HDL Ratio   VITAMIN D 25 Hydroxy (Vit-D Deficiency, Fractures)   B12 and Folate Panel   Rheumatoid Factor   Sed Rate (ESR)   ANA w/Reflex if Positive   Urinalysis, Routine w reflex microscopic    No orders of the defined types were placed in this encounter.   This patient was seen by Drema Dallas, PA-C in collaboration with Dr. Clayborn Bigness as a part of collaborative care agreement.   Total time spent:30 Minutes Time spent includes review of chart, medications, test results, and follow up plan with the patient.      Dr Lavera Guise Internal medicine

## 2022-08-07 LAB — URINALYSIS, ROUTINE W REFLEX MICROSCOPIC
Bilirubin, UA: NEGATIVE
Glucose, UA: NEGATIVE
Ketones, UA: NEGATIVE
Leukocytes,UA: NEGATIVE
Nitrite, UA: NEGATIVE
Protein,UA: NEGATIVE
RBC, UA: NEGATIVE
Specific Gravity, UA: 1.007 (ref 1.005–1.030)
Urobilinogen, Ur: 0.2 mg/dL (ref 0.2–1.0)
pH, UA: 6 (ref 5.0–7.5)

## 2022-08-11 ENCOUNTER — Other Ambulatory Visit: Payer: Self-pay | Admitting: Internal Medicine

## 2022-08-11 DIAGNOSIS — Z1231 Encounter for screening mammogram for malignant neoplasm of breast: Secondary | ICD-10-CM

## 2022-08-25 DIAGNOSIS — E782 Mixed hyperlipidemia: Secondary | ICD-10-CM | POA: Diagnosis not present

## 2022-08-25 DIAGNOSIS — E538 Deficiency of other specified B group vitamins: Secondary | ICD-10-CM | POA: Diagnosis not present

## 2022-08-25 DIAGNOSIS — M19041 Primary osteoarthritis, right hand: Secondary | ICD-10-CM | POA: Diagnosis not present

## 2022-08-25 DIAGNOSIS — R5383 Other fatigue: Secondary | ICD-10-CM | POA: Diagnosis not present

## 2022-08-25 DIAGNOSIS — M130 Polyarthritis, unspecified: Secondary | ICD-10-CM | POA: Diagnosis not present

## 2022-08-25 DIAGNOSIS — M19042 Primary osteoarthritis, left hand: Secondary | ICD-10-CM | POA: Diagnosis not present

## 2022-08-25 DIAGNOSIS — E559 Vitamin D deficiency, unspecified: Secondary | ICD-10-CM | POA: Diagnosis not present

## 2022-08-26 LAB — ANA W/REFLEX IF POSITIVE
Anti JO-1: <0.2 AI (ref 0.0–0.9)
Anti Nuclear Antibody (ANA): POSITIVE — AB
Centromere Ab Screen: <0.2 AI (ref 0.0–0.9)
Chromatin Ab SerPl-aCnc: <0.2 AI (ref 0.0–0.9)
ENA RNP Ab: 1.1 AI — ABNORMAL HIGH (ref 0.0–0.9)
ENA SM Ab Ser-aCnc: <0.2 AI (ref 0.0–0.9)
ENA SSA (RO) Ab: <0.2 AI (ref 0.0–0.9)
ENA SSB (LA) Ab: <0.2 AI (ref 0.0–0.9)
Scleroderma (Scl-70) (ENA) Antibody, IgG: <0.2 AI (ref 0.0–0.9)
dsDNA Ab: <1 IU/mL (ref 0–9)

## 2022-08-26 LAB — COMPREHENSIVE METABOLIC PANEL
ALT: 12 IU/L (ref 0–32)
AST: 18 IU/L (ref 0–40)
Albumin/Globulin Ratio: 1.7 (ref 1.2–2.2)
Albumin: 4 g/dL (ref 3.8–4.8)
Alkaline Phosphatase: 75 IU/L (ref 44–121)
BUN/Creatinine Ratio: 19 (ref 12–28)
BUN: 14 mg/dL (ref 8–27)
Bilirubin Total: 0.4 mg/dL (ref 0.0–1.2)
CO2: 23 mmol/L (ref 20–29)
Calcium: 9.5 mg/dL (ref 8.7–10.3)
Chloride: 99 mmol/L (ref 96–106)
Creatinine, Ser: 0.72 mg/dL (ref 0.57–1.00)
Globulin, Total: 2.4 g/dL (ref 1.5–4.5)
Glucose: 89 mg/dL (ref 70–99)
Potassium: 4.6 mmol/L (ref 3.5–5.2)
Sodium: 134 mmol/L (ref 134–144)
Total Protein: 6.4 g/dL (ref 6.0–8.5)
eGFR: 85 mL/min/{1.73_m2} (ref >59)

## 2022-08-26 LAB — LIPID PANEL WITH LDL/HDL RATIO
Cholesterol, Total: 166 mg/dL (ref 100–199)
HDL: 65 mg/dL (ref >39)
LDL Chol Calc (NIH): 86 mg/dL (ref 0–99)
LDL/HDL Ratio: 1.3 ratio (ref 0.0–3.2)
Triglycerides: 81 mg/dL (ref 0–149)
VLDL Cholesterol Cal: 15 mg/dL (ref 5–40)

## 2022-08-26 LAB — B12 AND FOLATE PANEL
Folate: 16.3 ng/mL (ref >3.0)
Vitamin B-12: 364 pg/mL (ref 232–1245)

## 2022-08-26 LAB — CBC WITH DIFFERENTIAL/PLATELET
Basophils Absolute: 0 10*3/uL (ref 0.0–0.2)
Basos: 1 %
EOS (ABSOLUTE): 0.1 10*3/uL (ref 0.0–0.4)
Eos: 2 %
Hematocrit: 39.1 % (ref 34.0–46.6)
Hemoglobin: 12.6 g/dL (ref 11.1–15.9)
Immature Grans (Abs): 0 10*3/uL (ref 0.0–0.1)
Immature Granulocytes: 0 %
Lymphocytes Absolute: 1.9 10*3/uL (ref 0.7–3.1)
Lymphs: 37 %
MCH: 28.1 pg (ref 26.6–33.0)
MCHC: 32.2 g/dL (ref 31.5–35.7)
MCV: 87 fL (ref 79–97)
Monocytes Absolute: 0.7 10*3/uL (ref 0.1–0.9)
Monocytes: 14 %
Neutrophils Absolute: 2.3 10*3/uL (ref 1.4–7.0)
Neutrophils: 46 %
Platelets: 244 10*3/uL (ref 150–450)
RBC: 4.48 x10E6/uL (ref 3.77–5.28)
RDW: 12.4 % (ref 11.7–15.4)
WBC: 5.1 10*3/uL (ref 3.4–10.8)

## 2022-08-26 LAB — VITAMIN D 25 HYDROXY (VIT D DEFICIENCY, FRACTURES): Vit D, 25-Hydroxy: 65.9 ng/mL (ref 30.0–100.0)

## 2022-08-26 LAB — RHEUMATOID FACTOR: Rheumatoid fact SerPl-aCnc: 19.4 IU/mL — ABNORMAL HIGH (ref <14.0)

## 2022-08-26 LAB — SEDIMENTATION RATE: Sed Rate: 16 mm/hr (ref 0–40)

## 2022-08-28 ENCOUNTER — Other Ambulatory Visit: Payer: Self-pay | Admitting: Physician Assistant

## 2022-08-28 ENCOUNTER — Telehealth: Payer: Self-pay | Admitting: Physician Assistant

## 2022-08-28 ENCOUNTER — Telehealth: Payer: Self-pay

## 2022-08-28 DIAGNOSIS — M0579 Rheumatoid arthritis with rheumatoid factor of multiple sites without organ or systems involvement: Secondary | ICD-10-CM

## 2022-08-28 DIAGNOSIS — R768 Other specified abnormal immunological findings in serum: Secondary | ICD-10-CM

## 2022-08-28 NOTE — Telephone Encounter (Signed)
Spoke with patient regarding lab results. 

## 2022-08-28 NOTE — Telephone Encounter (Signed)
Rheumatology referral sent via [facility] to [name] 

## 2022-08-28 NOTE — Telephone Encounter (Signed)
-----   Message from Carlean Jews, PA-C sent at 08/28/2022  9:02 AM EDT ----- Please let pt know that her ANA and RF were both positive which can indicate autoimmune condition which may contribute to her joint pain and I am referring to rheumatology. Her b12 is also borderline low and may want to consider B12 injections

## 2022-09-08 DIAGNOSIS — E2839 Other primary ovarian failure: Secondary | ICD-10-CM | POA: Diagnosis not present

## 2022-09-14 ENCOUNTER — Ambulatory Visit
Admission: RE | Admit: 2022-09-14 | Discharge: 2022-09-14 | Disposition: A | Discharge status: Home / Self Care | Payer: Medicare HMO | Source: Ambulatory Visit | Attending: Internal Medicine | Admitting: Internal Medicine

## 2022-09-14 DIAGNOSIS — Z1231 Encounter for screening mammogram for malignant neoplasm of breast: Secondary | ICD-10-CM | POA: Diagnosis not present

## 2022-09-16 ENCOUNTER — Telehealth: Payer: Self-pay | Admitting: Internal Medicine

## 2022-09-16 NOTE — Telephone Encounter (Signed)
MR from 05/11/2021 to present uploaded to [employer]; [name] 

## 2022-09-17 ENCOUNTER — Other Ambulatory Visit: Payer: Self-pay

## 2022-09-17 DIAGNOSIS — L819 Disorder of pigmentation, unspecified: Secondary | ICD-10-CM

## 2022-09-17 MED ORDER — TRETINOIN 0.025 % EX CREA: 1.0000 "application " | TOPICAL_CREAM | Freq: Every day | CUTANEOUS | 6 refills | Status: DC | PRN

## 2022-09-18 ENCOUNTER — Telehealth: Payer: Self-pay

## 2022-09-18 NOTE — Telephone Encounter (Signed)
Completed P.A. for patient's Tretinoin.

## 2022-09-24 ENCOUNTER — Telehealth: Payer: Self-pay | Admitting: Internal Medicine

## 2022-09-24 NOTE — Telephone Encounter (Signed)
Lvm and sent message regarding rheumatology referral-[name]

## 2022-09-24 NOTE — Telephone Encounter (Signed)
Rheumatology appointment>> 09/28/2022 with [name]

## 2022-09-28 DIAGNOSIS — Z111 Encounter for screening for respiratory tuberculosis: Secondary | ICD-10-CM | POA: Diagnosis not present

## 2022-09-28 DIAGNOSIS — M19041 Primary osteoarthritis, right hand: Secondary | ICD-10-CM | POA: Diagnosis not present

## 2022-09-28 DIAGNOSIS — M05742 Rheumatoid arthritis with rheumatoid factor of left hand without organ or systems involvement: Secondary | ICD-10-CM | POA: Diagnosis not present

## 2022-09-28 DIAGNOSIS — M05741 Rheumatoid arthritis with rheumatoid factor of right hand without organ or systems involvement: Secondary | ICD-10-CM | POA: Diagnosis not present

## 2022-09-28 DIAGNOSIS — M7989 Other specified soft tissue disorders: Secondary | ICD-10-CM | POA: Diagnosis not present

## 2022-09-28 DIAGNOSIS — Z7960 Long term (current) use of unspecified immunomodulators and immunosuppressants: Secondary | ICD-10-CM | POA: Diagnosis not present

## 2022-09-28 DIAGNOSIS — M19042 Primary osteoarthritis, left hand: Secondary | ICD-10-CM | POA: Diagnosis not present

## 2022-10-19 ENCOUNTER — Encounter: Payer: Self-pay | Admitting: Physician Assistant

## 2022-10-19 ENCOUNTER — Ambulatory Visit (INDEPENDENT_AMBULATORY_CARE_PROVIDER_SITE_OTHER): Payer: Medicare HMO | Admitting: Physician Assistant

## 2022-10-19 VITALS — BP 138/70 | HR 59 | Temp 97.8°F | Resp 16 | Ht 63.5 in | Wt 135.0 lb

## 2022-10-19 DIAGNOSIS — R300 Dysuria: Secondary | ICD-10-CM | POA: Diagnosis not present

## 2022-10-19 DIAGNOSIS — M0579 Rheumatoid arthritis with rheumatoid factor of multiple sites without organ or systems involvement: Secondary | ICD-10-CM

## 2022-10-19 DIAGNOSIS — I10 Essential (primary) hypertension: Secondary | ICD-10-CM

## 2022-10-19 NOTE — Addendum Note (Signed)
Addended by: Annamaria Helling on: 10/19/2022 04:18 PM   Modules accepted: Orders

## 2022-10-19 NOTE — Progress Notes (Signed)
[facility] [address] [place], [place] [address]  Internal MEDICINE  Office Visit Note  Patient Name: Amanda Livingston  [id number]  [id number]  Date of Service: 10/19/2022  Chief Complaint  Patient presents with   Hypertension   Follow-up    HPI Pt is here for routine follow up -Did see rheumatology, did not want to start on methotrexate as recommended due to possible S/E. She was given name of an alternative but also did not want to start this either based on S/E. She follows up with their office in a month -A few weeks ago had a few red bumps along armpits and then inner thigh, a little itchy and used some hydrocortisone cream. Thought maybe from heat, but not outdoors very long. Had not changed anything with soaps, detergents, etc. Stopped deoderant briefly but uses an all natural one she has used for years so unlikely cause. Most are gone except 2 on back and one on shoulder -will be seeing dermatology in August -mammogram and bone density normal -Staying active  Current Medication: Outpatient Encounter Medications as of 10/19/2022  Medication Sig   Biotin 1000 MCG tablet Take 2,000 mcg by mouth daily.   Calcium Carbonate (CALCIUM 500 PO) Take by mouth daily.   Cholecalciferol (VITAMIN D3) 1000 units CAPS Take 1,000 Units by mouth every other day.    cloNIDine (CATAPRES) 0.1 MG tablet Take 1 tablet (0.1 mg total) by mouth 2 (two) times daily as needed.   dimenhyDRINATE (DRAMAMINE) 50 MG tablet Take 25 mg by mouth at bedtime.   estradiol (ESTRACE) 0.5 MG tablet TAKE 1 TABLET BY MOUTH EVERY DAY   levothyroxine (SYNTHROID) 137 MCG tablet Take 137 mcg by mouth daily before breakfast.    losartan (COZAAR) 25 MG tablet Take 1 tablet (25 mg total) by mouth 2 (two) times daily.   melatonin 3 MG TABS tablet Take 3 mg by mouth at bedtime.   metoprolol succinate (TOPROL-XL) 25 MG 24 hr tablet Take 25 mg by mouth daily.   Probiotic Product (PROBIOTIC DAILY PO) Take by  mouth.   tretinoin (RETIN-A) 0.025 % cream Apply 1 application  topically daily as needed.   vitamin C (ASCORBIC ACID) 500 MG tablet Take 500 mg by mouth daily.   No facility-administered encounter medications on file as of 10/19/2022.    Surgical History: Past Surgical History:  Procedure Laterality Date   ABDOMINAL HYSTERECTOMY     CATARACT EXTRACTION W/PHACO Right 08/20/2021   Procedure: CATARACT EXTRACTION PHACO AND INTRAOCULAR LENS PLACEMENT (IOC) RIGHT MiLOOP 3.10 00:40.8;  Surgeon: Lockie Mola, MD;  Location: [facility];  Service: Ophthalmology;  Laterality: Right;   CATARACT EXTRACTION W/PHACO Left 09/03/2021   Procedure: CATARACT EXTRACTION PHACO AND INTRAOCULAR LENS PLACEMENT (IOC) LEFT;  Surgeon: Lockie Mola, MD;  Location: [facility];  Service: Ophthalmology;  Laterality: Left;  6.40 01:09.3   colonscopy  2015   CYSTOSCOPY W/ RETROGRADES Bilateral 10/31/2018   Procedure: CYSTOSCOPY WITH RETROGRADE PYELOGRAM;  Surgeon: Vanna Scotland, MD;  Location: [facility];  Service: Urology;  Laterality: Bilateral;   CYSTOSCOPY WITH BIOPSY N/A 10/31/2018   Procedure: CYSTOSCOPY WITH Bladder BIOPSY;  Surgeon: Vanna Scotland, MD;  Location: [facility];  Service: Urology;  Laterality: N/A;   ESOPHAGOGASTRODUODENOSCOPY (EGD) WITH PROPOFOL N/A 09/30/2017   Procedure: ESOPHAGOGASTRODUODENOSCOPY (EGD) WITH PROPOFOL;  Surgeon: Pasty Spillers, MD;  Location: [facility];  Service: Endoscopy;  Laterality: N/A;   NEPHRECTOMY Left    THYROIDECTOMY     TONSILLECTOMY  Right     Medical History: Past Medical History:  Diagnosis Date   Arthritis    hands   Complication of anesthesia    Dysrhythmia    PACs, PVCs   Family history of adverse reaction to anesthesia    PONV - mother and son   Hypertension    Hypothyroidism    Non-rheumatic mitral regurgitation    mild   Osteopenia    PONV (postoperative nausea and vomiting)    Solitary kidney, 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HCC)    thyroid    Family History: Family History  Problem Relation Age of Onset   Cancer Father     Social History   Socioeconomic History   Marital status: Divorced    Spouse name: Not on file   Number of children: Not on file   Years of education: Not on file   Highest education level: Not on file  Occupational History   Not on file  Tobacco Use   Smoking status: Former    Packs/day: .25    Types: Cigarettes    Quit date: 2004    Years since quitting: 20.4   Smokeless tobacco: Never  Vaping Use   Vaping Use: Never used  Substance and Sexual Activity   Alcohol use: Not Currently    Alcohol/week: 7.0 standard drinks of alcohol    Types: 7 Glasses of wine per week    Comment: half a glass full a few time a week. None since 05/2021   Drug use: No   Sexual activity: Not on file  Other Topics Concern   Not on file  Social History Narrative   Not on file   Social Determinants of Health   Financial Resource Strain: Not on file  Food Insecurity: Not on file  Transportation Needs: Not on file  Physical Activity: Not on file  Stress: Not on file  Social Connections: Not on file  Intimate Partner Violence: Not on file      Review of Systems  Constitutional:  Negative for chills, fatigue and unexpected weight change.  HENT:  Negative for congestion, postnasal drip, rhinorrhea, sneezing and sore throat.   Eyes:  Negative for redness.  Respiratory:  Negative for cough, chest tightness and shortness of breath.   Cardiovascular:  Negative for chest pain and palpitations.  Gastrointestinal:  Negative for abdominal pain, constipation, diarrhea, nausea and vomiting.  Genitourinary:  Negative for dysuria and frequency.  Musculoskeletal:  Positive for arthralgias and myalgias. Negative for back pain, joint swelling and neck pain.  Skin:  Positive for rash.  Neurological: Negative.  Negative for tremors and  numbness.  Hematological:  Negative for adenopathy. Does not bruise/bleed easily.  Psychiatric/Behavioral:  Negative for behavioral problems (Depression), sleep disturbance and suicidal ideas. The patient is not nervous/anxious.     Vital Signs: BP 138/70   Pulse (!) 59   Temp 97.8 F (36.6 C)   Resp 16   Ht 5' 3.5" (1.613 m)   Wt 135 lb (61.2 kg)   SpO2 97%   BMI 23.54 kg/m    Physical Exam Constitutional:      Appearance: Normal appearance.  HENT:     Head: Normocephalic and atraumatic.     Nose: Nose normal.     Mouth/Throat:     Mouth: Mucous membranes are moist.     Pharynx: No posterior oropharyngeal erythema.  Eyes:     Extraocular  Movements: Extraocular movements intact.     Pupils: Pupils are equal, round, and reactive to light.  Cardiovascular:     Rate and Rhythm: Normal rate and regular rhythm.     Pulses: Normal pulses.     Heart sounds: Normal heart sounds.  Pulmonary:     Effort: Pulmonary effort is normal.     Breath sounds: Normal breath sounds.  Musculoskeletal:        General: Swelling present.     Comments: Swelling and stiffness in fingers  Skin:    Findings: Rash present.  Neurological:     General: No focal deficit present.     Mental Status: She is alert.  Psychiatric:        Mood and Affect: Mood normal.        Behavior: Behavior normal.        Assessment/Plan: 1. Benign hypertension Stable, continue current medications  2. Rheumatoid arthritis involving multiple sites with positive rheumatoid factor (HCC) Followed by rheumatology   General Counseling: Consuelo Pandy understanding of the findings of todays visit and agrees with plan of treatment. I have discussed any further diagnostic evaluation that may be needed or ordered today. We also reviewed her medications today. she has been encouraged to call the office with any questions or concerns that should arise related to todays visit.    No orders of the defined types  were placed in this encounter.   No orders of the defined types were placed in this encounter.   This patient was seen by Lynn Ito, PA-C in collaboration with Dr. Beverely Risen as a part of collaborative care agreement.   Total time spent:30 Minutes Time spent includes review of chart, medications, test results, and follow up plan with the patient.      Dr Lyndon Code Internal medicine

## 2022-10-20 ENCOUNTER — Telehealth: Payer: Self-pay

## 2022-10-20 LAB — UA/M W/RFLX CULTURE, ROUTINE
Bilirubin, UA: NEGATIVE
Glucose, UA: NEGATIVE
Ketones, UA: NEGATIVE
Leukocytes,UA: NEGATIVE
Nitrite, UA: NEGATIVE
Protein,UA: NEGATIVE
RBC, UA: NEGATIVE
Specific Gravity, UA: 1.009 (ref 1.005–1.030)
Urobilinogen, Ur: 0.2 mg/dL (ref 0.2–1.0)
pH, UA: 6 (ref 5.0–7.5)

## 2022-10-20 LAB — MICROSCOPIC EXAMINATION
Bacteria, UA: NONE SEEN
Casts: NONE SEEN /lpf
RBC, Urine: NONE SEEN /hpf (ref 0–2)

## 2022-10-20 NOTE — Telephone Encounter (Signed)
-----   Message from Carlean Jews, PA-C sent at 10/20/2022  8:36 AM EDT ----- Please let her know that her urine was negative

## 2022-10-20 NOTE — Telephone Encounter (Signed)
Spoke with patient regarding urine culture. 

## 2022-10-29 DIAGNOSIS — I519 Heart disease, unspecified: Secondary | ICD-10-CM | POA: Diagnosis not present

## 2022-10-29 DIAGNOSIS — R002 Palpitations: Secondary | ICD-10-CM | POA: Diagnosis not present

## 2022-10-29 DIAGNOSIS — I10 Essential (primary) hypertension: Secondary | ICD-10-CM | POA: Diagnosis not present

## 2022-10-29 DIAGNOSIS — I491 Atrial premature depolarization: Secondary | ICD-10-CM | POA: Diagnosis not present

## 2022-10-29 DIAGNOSIS — I493 Ventricular premature depolarization: Secondary | ICD-10-CM | POA: Diagnosis not present

## 2022-10-30 ENCOUNTER — Ambulatory Visit
Admission: EM | Admit: 2022-10-30 | Discharge: 2022-10-30 | Disposition: A | Discharge status: Home / Self Care | Payer: Medicare HMO

## 2022-10-30 DIAGNOSIS — L989 Disorder of the skin and subcutaneous tissue, unspecified: Secondary | ICD-10-CM | POA: Diagnosis not present

## 2022-10-30 NOTE — ED Triage Notes (Signed)
Pt is here for bumps under her arms pits.Pt reports the bumps/rash come and go.  Pt has been using creams with no relief.

## 2022-10-30 NOTE — ED Provider Notes (Signed)
Renaldo Fiddler    CSN: [id number] Arrival date & time: 10/30/22  1546      History   Chief Complaint Chief Complaint  Patient presents with   Rash    HPI Amanda Livingston is a 77 y.o. female.    Rash   This urgent care with complaint of "bumps" under her armpits.  She states the rash is intermittent times several days.  Patient states she noticed bumps in her axilla area, 1 at each axilla, itchy several days ago.  She applied cortisone cream and they eventually resolved.  She then noticed additional lesions on her inner thighs, 1 on each, then noticed that she developed additional single lesions at her axillas.  Patient states she called her dermatologist and was suggested that lesions were "an allergy" to an insect bite.  She states she has a bite on her wrist that she wonders is the original source.  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   Ovarian failure 04/18/2020   Encounter for screening mammogram for malignant neoplasm of breast 04/18/2020   Occlusion and stenosis of bilateral carotid arteries 01/14/2020   Pain in joint of right ankle 12/20/2019   Lipoma of skin of abdomen 09/24/2019   Hypothyroidism, postop 07/11/2019   Other long term (current) drug therapy 07/11/2019   Personal history of malignant  neoplasm of thyroid 07/11/2019   Atypical chest pain 02/22/2019   Non-intractable vomiting 10/24/2018   Unspecified menopausal and perimenopausal disorder 10/24/2018   Tobacco use 09/28/2018   Urinary tract infection with hematuria 05/25/2018   Dysuria 05/25/2018   Vaginal itching 04/28/2018   Bacterial vaginitis 04/28/2018   Vaginal yeast infection 04/28/2018   Nonrheumatic mitral valve regurgitation 03/07/2018   Premature atrial contractions 12/02/2017   Non-ulcerative nasal mucositis 10/18/2017   Hyperpigmentation 10/18/2017   Reflux esophagitis    Thickening of esophagus    Stomach irritation    Hiatal hernia    Heartburn    Hypothyroidism 06/14/2017   Nicotine dependence, cigarettes, uncomplicated 06/14/2017   Palpitations 04/26/2017   Hyperlipidemia 04/26/2017   Incomplete emptying of bladder 10/06/2016   Premature ventricular contractions 07/24/2016   Left ventricular diastolic dysfunction 07/15/2016   Essential hypertension, benign 02/21/2016   Single kidney 10/05/2014   Chronic cystitis 10/05/2014   Increased frequency of urination 10/05/2014    Past Surgical History:  Procedure Laterality Date   ABDOMINAL HYSTERECTOMY     CATARACT EXTRACTION W/PHACO Right 08/20/2021   Procedure: CATARACT EXTRACTION PHACO AND INTRAOCULAR LENS PLACEMENT (IOC) RIGHT MiLOOP 3.10 00:40.8;  Surgeon: Lockie Mola, MD;  Location: [facility];  Service: Ophthalmology;  Laterality: Right;   CATARACT EXTRACTION W/PHACO Left 09/03/2021   Procedure: CATARACT EXTRACTION PHACO AND INTRAOCULAR  LENS PLACEMENT (IOC) LEFT;  Surgeon: Lockie Mola, MD;  Location: [facility];  Service: Ophthalmology;  Laterality: Left;  6.40 01:09.3   colonscopy  2015   CYSTOSCOPY W/ RETROGRADES Bilateral 10/31/2018   Procedure: CYSTOSCOPY WITH RETROGRADE PYELOGRAM;  Surgeon: Vanna Scotland, MD;  Location: [facility];  Service: Urology;  Laterality: Bilateral;   CYSTOSCOPY WITH BIOPSY N/A  10/31/2018   Procedure: CYSTOSCOPY WITH Bladder BIOPSY;  Surgeon: Vanna Scotland, MD;  Location: [facility];  Service: Urology;  Laterality: N/A;   ESOPHAGOGASTRODUODENOSCOPY (EGD) WITH PROPOFOL N/A 09/30/2017   Procedure: ESOPHAGOGASTRODUODENOSCOPY (EGD) WITH PROPOFOL;  Surgeon: Pasty Spillers, MD;  Location: [facility];  Service: Endoscopy;  Laterality: N/A;   NEPHRECTOMY Left    THYROIDECTOMY     TONSILLECTOMY Right     OB History   No obstetric history on file.      Home Medications    Prior to Admission medications   Medication Sig Start Date End Date Taking? Authorizing Provider  Biotin 1000 MCG tablet Take 2,000 mcg by mouth daily.   Yes [provider]  Calcium Carbonate (CALCIUM 500 PO) Take by mouth daily.   Yes [provider]  Cholecalciferol (VITAMIN D3) 1000 units CAPS Take 1,000 Units by mouth every other day.    Yes [provider]  cloNIDine (CATAPRES) 0.1 MG tablet Take 1 tablet (0.1 mg total) by mouth 2 (two) times daily as needed. 09/03/15  Yes Jennye Moccasin, MD  dimenhyDRINATE (DRAMAMINE) 50 MG tablet Take 25 mg by mouth at bedtime.   Yes [provider]  estradiol (ESTRACE) 0.5 MG tablet TAKE 1 TABLET BY MOUTH EVERY DAY 02/19/22  Yes Lyndon Code, MD  levothyroxine (SYNTHROID) 137 MCG tablet Take 137 mcg by mouth daily before breakfast.  12/30/15  Yes [provider]  losartan (COZAAR) 25 MG tablet Take 1 tablet (25 mg total) by mouth 2 (two) times daily. 12/11/19  Yes Boscia, Heather E, NP  melatonin 3 MG TABS tablet Take 3 mg by mouth at bedtime.   Yes [provider]  metoprolol succinate (TOPROL-XL) 25 MG 24 hr tablet Take 25 mg by mouth daily.   Yes [provider]  Probiotic Product (PROBIOTIC DAILY PO) Take by mouth.   Yes [provider]  tretinoin (RETIN-A) 0.025 % cream Apply 1 application  topically daily as needed. 09/17/22  Yes McDonough, Lauren K, PA-C  vitamin C (ASCORBIC  ACID) 500 MG tablet Take 500 mg by mouth daily.   Yes [provider]    Family History Family History  Problem Relation Age of Onset   Cancer Father     Social History Social History   Tobacco Use   Smoking status: Former    Packs/day: .25    Types: Cigarettes    Quit date: 2004    Years since quitting: 20.4   Smokeless tobacco: Never  Vaping Use   Vaping Use: Never used  Substance Use Topics   Alcohol use: Not Currently    Alcohol/week: 7.0 standard drinks of alcohol    Types: 7 Glasses of wine per week    Comment: half a glass full a few time a week. None since 05/2021   Drug use: No     Allergies   Levaquin [levofloxacin] and Nitrofurantoin   Review of Systems Review of Systems  Skin:  Positive for rash.     Physical Exam Triage Vital Signs ED Triage Vitals [10/30/22 1610]  Enc Vitals Group  BP 125/74     Pulse Rate 96     Resp 18     Temp 97.8 F (36.6 C)     Temp Source Oral     SpO2 96 %     Weight      Height      Head Circumference      Peak Flow      Pain Score      Pain Loc      Pain Edu?      Excl. in GC?    No data found.  Updated Vital Signs BP 125/74 (BP Location: Left Arm)   Pulse 96   Temp 97.8 F (36.6 C) (Oral)   Resp 18   SpO2 96%   Visual Acuity Right Eye Distance:   Left Eye Distance:   Bilateral Distance:    Right Eye Near:   Left Eye Near:    Bilateral Near:     Physical Exam Vitals reviewed.  Constitutional:      Appearance: Normal appearance.  Chest:    Musculoskeletal:       Legs:  Skin:    General: Skin is warm and dry.     Findings: Lesion present.  Neurological:     General: No focal deficit present.     Mental Status: She is alert and oriented to person, place, and time.  Psychiatric:        Mood and Affect: Mood normal.        Behavior: Behavior normal.      UC Treatments / Results  Labs (all labs ordered are listed, but only abnormal results are displayed) Labs Reviewed  - No data to display  EKG   Radiology No results found.  Procedures Procedures (including critical care time)  Medications Ordered in UC Medications - No data to display  Initial Impression / Assessment and Plan / UC Course  I have reviewed the triage vital signs and the nursing notes.  Pertinent labs & imaging results that were available during my care of the patient were reviewed by me and considered in my medical decision making (see chart for details).   Amanda Livingston is a 77 y.o. female presenting with several papular lesions. Patient is afebrile without recent antipyretics, satting well on room air. Overall is very well appearing though non-toxic, well hydrated, without respiratory distress.  Ingal discrete papular lesion is noted at each axilla bilaterally, and at her proximal right inner thigh.  No surrounding erythema or rash.  No discharge.  Reviewed relevant chart history.   Given lesions are discrete at each location, I feel that the most likely scenario is some sort of insect bite.  Unlikely a rash or allergic response.  She is currently using cortisone which may be providing some improvement.  She asks if there is a stronger treatment however I would prefer to avoid prescribing a stronger corticosteroid cream to prevent the risk of thinning skin from overuse or high potency.  I did suggest that she could marginally increase the potency of the hydrocortisone by covering the application with petroleum jelly.  Final Clinical Impressions(s) / UC Diagnoses   Final diagnoses:  None   Discharge Instructions   None    ED Prescriptions   None    PDMP not reviewed this encounter.   Charma Igo, [place] 10/30/22 1657

## 2022-10-30 NOTE — Discharge Instructions (Addendum)
Follow-up by evaluation in dermatology if your symptoms do not resolve.

## 2022-11-03 DIAGNOSIS — L309 Dermatitis, unspecified: Secondary | ICD-10-CM | POA: Diagnosis not present

## 2022-11-09 DIAGNOSIS — M19041 Primary osteoarthritis, right hand: Secondary | ICD-10-CM | POA: Diagnosis not present

## 2022-11-09 DIAGNOSIS — M19042 Primary osteoarthritis, left hand: Secondary | ICD-10-CM | POA: Diagnosis not present

## 2022-11-09 DIAGNOSIS — M05742 Rheumatoid arthritis with rheumatoid factor of left hand without organ or systems involvement: Secondary | ICD-10-CM | POA: Diagnosis not present

## 2022-11-09 DIAGNOSIS — M05741 Rheumatoid arthritis with rheumatoid factor of right hand without organ or systems involvement: Secondary | ICD-10-CM | POA: Diagnosis not present

## 2022-11-17 ENCOUNTER — Other Ambulatory Visit: Payer: Medicare HMO | Admitting: Urology

## 2022-11-23 ENCOUNTER — Ambulatory Visit: Payer: Medicare HMO | Admitting: Physician Assistant

## 2022-11-23 ENCOUNTER — Encounter: Payer: Self-pay | Admitting: Physician Assistant

## 2022-11-23 VITALS — BP 110/70 | HR 137 | Temp 97.8°F | Resp 16 | Ht 63.5 in | Wt 135.4 lb

## 2022-11-23 DIAGNOSIS — I4891 Unspecified atrial fibrillation: Secondary | ICD-10-CM

## 2022-11-23 DIAGNOSIS — R7989 Other specified abnormal findings of blood chemistry: Secondary | ICD-10-CM | POA: Diagnosis not present

## 2022-11-23 DIAGNOSIS — R21 Rash and other nonspecific skin eruption: Secondary | ICD-10-CM

## 2022-11-23 DIAGNOSIS — I499 Cardiac arrhythmia, unspecified: Secondary | ICD-10-CM | POA: Diagnosis not present

## 2022-11-23 NOTE — Progress Notes (Signed)
[facility] [address] [place], [place] [address]  Internal MEDICINE  Office Visit Note  Patient Name: Amanda Livingston  [id number]  [id number]  Date of Service: 11/23/2022  Chief Complaint  Patient presents with   Acute Visit   Skin Problem    Rash/bumps that come and go, currently not visible. Began the week of June 20th. Has been seen at urgent care and dermatology. Patient given triamcinolone at dermatology, says this has been helping.     HPI Pt is here for a sick visit. -Had some bumps/welts and went to urgent care in June and they thought it was bug bites. Lasted 2-3 days and used cortisone -The next week went to dermatology, wasn't sure what it was. Also thought possibly bug bites but again no bugs seen and unsure how this would occur given location only under arms. Given triamcinolone and this worked better, but continues to have places pop up. -Last 2 nights had a welt under arms and puts ice and triamcinolone and when she wakes it is gone. -Then again walking dog and thought something popping up too.  -Stopped deodorant and didn't make a difference. No new meds, soaps, detergents -may start zyrtec as this could be allergy related though also may be heat related given location and improvement overnight -Decided against taking meds for RA due to possible side effects, plans to discuss further at upcoming follow up with Dr. Welton Flakes -HR elevated in office today and reports she sees cardiology for abnormal PVCs, but for the last few days she has noticed her HR elevating and just has not felt well. On exam herr rhythm was irregular therefore an EKG was ordered and was consistent with afib which is new for her. Dr. Welton Flakes was consulted by phone and patient given Eliquis samples to start 5mg  BID until she speaks with cardiology. She is going to call their office to be seen as soon as possible for furher monitoring and adjustment -Sees Dr. Gershon Crane for thyroid, labs done  earlier this year and states they were normal. Did discuss hyperthyroid can lead to afib and will likely need to follow up on this. Upon further chart review TSH was suppressed and may indicate a possible cause   Current Medication:  Outpatient Encounter Medications as of 11/23/2022  Medication Sig   apixaban (ELIQUIS) 5 MG TABS tablet Take 5 mg by mouth 2 (two) times daily.   Biotin 1000 MCG tablet Take 2,000 mcg by mouth daily.   Calcium Carbonate (CALCIUM 500 PO) Take by mouth daily.   Cholecalciferol (VITAMIN D3) 1000 units CAPS Take 1,000 Units by mouth every other day.    cloNIDine (CATAPRES) 0.1 MG tablet Take 1 tablet (0.1 mg total) by mouth 2 (two) times daily as needed.   dimenhyDRINATE (DRAMAMINE) 50 MG tablet Take 25 mg by mouth at bedtime.   estradiol (ESTRACE) 0.5 MG tablet TAKE 1 TABLET BY MOUTH EVERY DAY   levothyroxine (SYNTHROID) 137 MCG tablet Take 137 mcg by mouth daily before breakfast.    losartan (COZAAR) 25 MG tablet Take 1 tablet (25 mg total) by mouth 2 (two) times daily.   melatonin 3 MG TABS tablet Take 3 mg by mouth at bedtime.   metoprolol succinate (TOPROL-XL) 25 MG 24 hr tablet Take 25 mg by mouth daily.   Probiotic Product (PROBIOTIC DAILY PO) Take by mouth.   tretinoin (RETIN-A) 0.025 % cream Apply 1 application  topically daily as needed.   vitamin C (ASCORBIC ACID) 500  MG tablet Take 500 mg by mouth daily.   No facility-administered encounter medications on file as of 11/23/2022.      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HCC)    thyroid      Vital Signs: BP 110/70   Pulse (!) 137   Temp 97.8 F (36.6 C)   Resp 16   Ht 5' 3.5" (1.613 m)   Wt 135 lb 6.4 oz (61.4 kg)   SpO2 96%   BMI 23.61 kg/m    Review of Systems  Constitutional:  Positive for fatigue. Negative for chills and unexpected weight change.  HENT:  Negative for congestion, postnasal drip, rhinorrhea, sneezing and sore throat.   Eyes:  Negative for redness.  Respiratory:  Negative for cough, chest tightness and shortness of breath.   Cardiovascular:  Positive for palpitations. Negative for chest pain.  Gastrointestinal:  Negative for abdominal pain, constipation, diarrhea, nausea and vomiting.  Genitourinary:  Negative for dysuria and frequency.  Musculoskeletal:  Positive for arthralgias and myalgias. Negative for back pain, joint swelling and neck pain.  Skin:  Positive for rash.  Neurological: Negative.  Negative for tremors and numbness.  Hematological:  Negative for adenopathy. Does not bruise/bleed easily.  Psychiatric/Behavioral:  Negative for behavioral problems (Depression), sleep disturbance and suicidal ideas. The patient is not nervous/anxious.     Physical Exam Constitutional:      Appearance: Normal appearance.  HENT:     Head: Normocephalic and atraumatic.     Nose: Nose normal.     Mouth/Throat:     Mouth: Mucous membranes are moist.     Pharynx: No posterior oropharyngeal erythema.  Eyes:     Extraocular Movements: Extraocular movements intact.     Pupils: Pupils are equal, round, and reactive to light.  Cardiovascular:     Rate and Rhythm: Tachycardia present. Rhythm irregular.     Pulses: Normal pulses.     Heart sounds: Normal heart sounds.  Pulmonary:     Effort: Pulmonary effort is normal.     Breath sounds: Normal breath sounds.  Musculoskeletal:        General: Swelling present.     Comments: Swelling and stiffness in fingers  Neurological:     General: No focal deficit present.     Mental Status: She is alert.   Psychiatric:        Mood and Affect: Mood normal.        Behavior: Behavior normal.       Assessment/Plan: 1. Irregular heart rhythm - EKG 12-Lead  2. New onset a-fib [facility]) Given Eliquis samples for 5mg  BID until patient can discuss further with cardiology. Continue metoprolol. Pt will go ahead and contact cardiology to be seen  3. Low TSH level Upon chart review, TSH was suppressed on labs by outside provider and may need to be addressed further as possible contributor to new afib if hyperthyroid on labs  4. Rash and nonspecific skin eruption Continue triamcinolone and  may try zyrtec as well as keeping area cool   General Counseling: robyn galati understanding of the findings of todays visit and agrees with plan of treatment. I have discussed any further diagnostic evaluation that may be needed or ordered today. We also reviewed her medications today. she has been encouraged to call the office with any questions or concerns that should arise related to todays visit.    Counseling:    Orders Placed This Encounter  Procedures   EKG 12-Lead    No orders of the defined types were placed in this encounter.   Time spent:35 Minutes

## 2022-11-24 ENCOUNTER — Ambulatory Visit: Payer: Medicare HMO | Admitting: Internal Medicine

## 2022-11-24 ENCOUNTER — Telehealth: Payer: Self-pay | Admitting: Internal Medicine

## 2022-11-24 NOTE — Telephone Encounter (Signed)
Faxed EKG to Dr. Darrold Junker @ [facility]; [phone]

## 2022-11-25 DIAGNOSIS — R002 Palpitations: Secondary | ICD-10-CM | POA: Diagnosis not present

## 2022-11-25 DIAGNOSIS — I4891 Unspecified atrial fibrillation: Secondary | ICD-10-CM | POA: Diagnosis not present

## 2022-12-03 DIAGNOSIS — L508 Other urticaria: Secondary | ICD-10-CM | POA: Diagnosis not present

## 2022-12-03 DIAGNOSIS — T07XXXA Unspecified multiple injuries, initial encounter: Secondary | ICD-10-CM | POA: Diagnosis not present

## 2022-12-07 ENCOUNTER — Ambulatory Visit (INDEPENDENT_AMBULATORY_CARE_PROVIDER_SITE_OTHER): Payer: Medicare HMO | Admitting: Physician Assistant

## 2022-12-07 ENCOUNTER — Encounter: Payer: Self-pay | Admitting: Physician Assistant

## 2022-12-07 ENCOUNTER — Telehealth: Payer: Self-pay | Admitting: Internal Medicine

## 2022-12-07 VITALS — BP 132/70 | HR 57 | Temp 98.1°F | Resp 16 | Ht 63.5 in | Wt 137.6 lb

## 2022-12-07 DIAGNOSIS — I499 Cardiac arrhythmia, unspecified: Secondary | ICD-10-CM | POA: Diagnosis not present

## 2022-12-07 DIAGNOSIS — R7989 Other specified abnormal findings of blood chemistry: Secondary | ICD-10-CM

## 2022-12-07 NOTE — Telephone Encounter (Signed)
Lvm to move today's appointment-Amanda Livingston
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
## 2022-12-11 ENCOUNTER — Telehealth: Payer: Self-pay

## 2022-12-11 NOTE — Telephone Encounter (Signed)
Patient called to report that she has been having some scalp discomfort and pain. Pain is not consistent and comes/goes. Per Alyssa, advised patient to monitor pain, as well as other symptoms. If anything worsens, advised patient to go to the ED or urgent care.

## 2022-12-16 ENCOUNTER — Ambulatory Visit: Payer: Medicare HMO | Admitting: Urology

## 2022-12-16 VITALS — BP 129/78 | HR 60 | Ht 63.5 in | Wt 137.0 lb

## 2022-12-16 DIAGNOSIS — Z86018 Personal history of other benign neoplasm: Secondary | ICD-10-CM | POA: Diagnosis not present

## 2022-12-16 DIAGNOSIS — R002 Palpitations: Secondary | ICD-10-CM | POA: Diagnosis not present

## 2022-12-16 DIAGNOSIS — D414 Neoplasm of uncertain behavior of bladder: Secondary | ICD-10-CM

## 2022-12-16 LAB — MICROSCOPIC EXAMINATION: Epithelial Cells (non renal): >10 /hpf — AB (ref 0–10)

## 2022-12-16 LAB — URINALYSIS, COMPLETE
Bilirubin, UA: NEGATIVE
Glucose, UA: NEGATIVE
Ketones, UA: NEGATIVE
Nitrite, UA: NEGATIVE
Protein,UA: NEGATIVE
Specific Gravity, UA: 1.01 (ref 1.005–1.030)
Urobilinogen, Ur: 0.2 mg/dL (ref 0.2–1.0)
pH, UA: 6 (ref 5.0–7.5)

## 2022-12-16 NOTE — Progress Notes (Signed)
   12/16/22  CC:  Chief Complaint  Patient presents with   Cysto     HPI: Amanda Livingston is a 77 y.o.female with a personal history of PUNLMP of the bladder who presents today for an annual surveillance cystoscopy.   She was taken to the operating room on 10/31/2018 for bladder biopsy/resection of this small 1 cm lesion.  Bilateral retrogrades unremarkable.  Surgical pathology consistent with PUNLMP.  She has a history of smoking, she stopped for about 15 years. She is no longer smoking.   Vitals:   12/16/22 1433  BP: 129/78  Pulse: 60  NED. A&Ox3.   No respiratory distress   Abd soft, NT, ND Normal external genitalia with patent urethral meatus  Cystoscopy Procedure Note  Patient identification was confirmed, informed consent was obtained, and patient was prepped using Betadine solution.  Lidocaine jelly was administered per urethral meatus.    Procedure: - Flexible cystoscope introduced, without any difficulty.   - Thorough search of the bladder revealed:    normal urethral meatus    normal urothelium    no stones    no ulcers     no tumors    no urethral polyps    no trabeculation - Stellate scar on left dome of bladder wall - Ureteral orifices were normal in position and appearance.  Post-Procedure: - Patient tolerated the procedure well  Assessment/ Plan:  1. 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     Vanna Scotland, MD

## 2022-12-17 ENCOUNTER — Telehealth: Payer: Self-pay | Admitting: Nurse Practitioner

## 2022-12-17 ENCOUNTER — Ambulatory Visit: Payer: Medicare HMO | Admitting: Nurse Practitioner

## 2022-12-17 NOTE — Telephone Encounter (Signed)
Lvm to move up or r/s 12/17/22 appointment due to weather-[name]

## 2022-12-18 ENCOUNTER — Ambulatory Visit (INDEPENDENT_AMBULATORY_CARE_PROVIDER_SITE_OTHER): Payer: Medicare HMO | Admitting: Nurse Practitioner

## 2022-12-18 ENCOUNTER — Encounter: Payer: Self-pay | Admitting: Nurse Practitioner

## 2022-12-18 VITALS — BP 125/65 | HR 73 | Temp 97.4°F | Resp 16 | Ht 63.5 in | Wt 135.6 lb

## 2022-12-18 DIAGNOSIS — G4709 Other insomnia: Secondary | ICD-10-CM

## 2022-12-18 DIAGNOSIS — R300 Dysuria: Secondary | ICD-10-CM | POA: Diagnosis not present

## 2022-12-18 LAB — POCT URINALYSIS DIPSTICK
Bilirubin, UA: NEGATIVE
Glucose, UA: NEGATIVE
Leukocytes, UA: NEGATIVE
Nitrite, UA: NEGATIVE
Protein, UA: NEGATIVE
Spec Grav, UA: <=1.005 — AB (ref 1.010–1.025)
Urobilinogen, UA: 0.2 E.U./dL
pH, UA: 6 (ref 5.0–8.0)

## 2022-12-18 MED ORDER — TRAZODONE HCL 50 MG PO TABS
50.0000 mg | ORAL_TABLET | Freq: Every day | ORAL | 3 refills | Status: DC
Start: 2022-12-18 — End: 2023-04-28

## 2022-12-18 NOTE — Progress Notes (Signed)
[facility] [address] [place], [place] [address]  Internal MEDICINE  Office Visit Note  Patient Name: Amanda Livingston  [id number]  [id number]  Date of Service: 12/18/2022  Chief Complaint  Patient presents with   Acute Visit    Headache, soreness on head.     HPI Jazline presents for an acute sick visit for headache but they are resolving.  2 weeks ago, shooting pains in head, scalp was so sore, could not touch, nausea, unable to eat.  Friday, stopped pains, Saturday, stopped feeling sore, no fever.  Had a rash a while back that resolved on its own.  Cannot sleep good at night, tried trazodone before, wants to try it again.  And having difficulty with BM, off and on constipation.  Will call clinic if headaches return.  Provided urine specimen for urinalysis, had UA done at urology office 2 days agao and it showed trace blood and leukocytes. She wants to have it rechecked.    Current Medication:  Outpatient Encounter Medications as of 12/18/2022  Medication Sig   Biotin 1000 MCG tablet Take 2,000 mcg by mouth daily.   Calcium Carbonate (CALCIUM 500 PO) Take by mouth daily.   Cholecalciferol (VITAMIN D3) 1000 units CAPS Take 1,000 Units by mouth every other day.    cloNIDine (CATAPRES) 0.1 MG tablet Take 1 tablet (0.1 mg total) by mouth 2 (two) times daily as needed.   dimenhyDRINATE (DRAMAMINE) 50 MG tablet Take 25 mg by mouth at bedtime.   estradiol (ESTRACE) 0.5 MG tablet TAKE 1 TABLET BY MOUTH EVERY DAY   levothyroxine (SYNTHROID) 137 MCG tablet Take 137 mcg by mouth daily before breakfast.    losartan (COZAAR) 25 MG tablet Take 1 tablet (25 mg total) by mouth 2 (two) times daily.   melatonin 3 MG TABS tablet Take 3 mg by mouth at bedtime.   metoprolol succinate (TOPROL-XL) 25 MG 24 hr tablet Take 25 mg by mouth daily.   Probiotic Product (PROBIOTIC DAILY PO) Take by mouth.   traZODone (DESYREL) 50 MG tablet Take 1-2 tablets (50-100 mg total) by mouth at  bedtime.   tretinoin (RETIN-A) 0.025 % cream Apply 1 application  topically daily as needed.   [DISCONTINUED] apixaban (ELIQUIS) 5 MG TABS tablet Take 5 mg by mouth 2 (two) times daily.   [DISCONTINUED] vitamin C (ASCORBIC ACID) 500 MG tablet Take 500 mg by mouth daily.   No facility-administered encounter medications on file as of 12/18/2022.      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HCC)    thyroid     Vital Signs: BP 125/65 Comment: 130/90  Pulse 73   Temp (!) 97.4 F (36.3 C)   Resp 16   Ht 5' 3.5" (1.613 m)   Wt 135 lb 9.6 oz (61.5 kg)   SpO2 99%   BMI 23.64 kg/m    Review of Systems  Constitutional:  Positive for fatigue.  HENT: Negative.    Respiratory:  Negative for cough, chest tightness, shortness of breath and wheezing.   Cardiovascular: Negative.  Negative for chest pain and palpitations.  Gastrointestinal:  Positive for constipation (comes and goes) and nausea (off and on).  Neurological:  Positive for headaches.  Psychiatric/Behavioral:  Positive for sleep disturbance.     Physical Exam Vitals reviewed.  Constitutional:      Appearance: Normal appearance. She is normal weight.  HENT:     Head: Normocephalic and atraumatic.  Eyes:     Pupils: Pupils are equal, round, and reactive to light.  Cardiovascular:     Rate and Rhythm: Normal rate and regular rhythm.  Pulmonary:     Effort: Pulmonary effort is normal. No respiratory distress.  Neurological:     Mental Status: She is alert and oriented to person, place, and time.  Psychiatric:         Mood and Affect: Mood normal.        Behavior: Behavior normal.       Assessment/Plan: 1. Other insomnia Restart trazodone as prescribed to help with sleep.  - traZODone (DESYREL) 50 MG tablet; Take 1-2 tablets (50-100 mg total) by mouth at bedtime.  Dispense: 60 tablet; Refill: 3  2. Dysuria Urinalysis improved when compared to UA from 8/7 at urology office. Still trace blood but no leukocytes. No culture sent. Followed by urology.  - POCT urinalysis dipstick   General Counseling: tariana bonfiglio understanding of the findings of todays visit and agrees with plan of treatment. I have discussed any further diagnostic evaluation that may be needed or ordered today. We also reviewed her medications today. she has been encouraged to call the office with any questions or concerns that should arise related to todays visit.    Counseling:    No orders of the defined types were placed in this encounter.   Meds ordered this encounter  Medications   traZODone (DESYREL) 50 MG tablet    Sig: Take 1-2 tablets (50-100 mg total) by mouth at bedtime.    Dispense:  60 tablet    Refill:  3    Return if symptoms worsen or fail to improve.  [place] Controlled Substance Database was reviewed by me for overdose risk score (ORS)  Time spent:20 Minutes Time spent with patient included reviewing progress notes, labs, imaging studies, and discussing plan for follow up.   This patient was seen by Sallyanne Kuster, FNP-C in collaboration with Dr. Beverely Risen as a part of collaborative care agreement.   R. Tedd Sias, MSN, FNP-C Internal Medicine

## 2022-12-22 DIAGNOSIS — H04203 Unspecified epiphora, bilateral lacrimal glands: Secondary | ICD-10-CM | POA: Diagnosis not present

## 2022-12-23 DIAGNOSIS — I493 Ventricular premature depolarization: Secondary | ICD-10-CM | POA: Diagnosis not present

## 2022-12-23 DIAGNOSIS — R002 Palpitations: Secondary | ICD-10-CM | POA: Diagnosis not present

## 2022-12-23 DIAGNOSIS — I340 Nonrheumatic mitral (valve) insufficiency: Secondary | ICD-10-CM | POA: Diagnosis not present

## 2022-12-23 DIAGNOSIS — I10 Essential (primary) hypertension: Secondary | ICD-10-CM | POA: Diagnosis not present

## 2022-12-23 DIAGNOSIS — I491 Atrial premature depolarization: Secondary | ICD-10-CM | POA: Diagnosis not present

## 2022-12-29 ENCOUNTER — Ambulatory Visit: Payer: Medicare HMO | Admitting: Internal Medicine

## 2023-01-07 DIAGNOSIS — I493 Ventricular premature depolarization: Secondary | ICD-10-CM | POA: Diagnosis not present

## 2023-01-07 DIAGNOSIS — I779 Disorder of arteries and arterioles, unspecified: Secondary | ICD-10-CM | POA: Diagnosis not present

## 2023-01-07 DIAGNOSIS — C73 Malignant neoplasm of thyroid gland: Secondary | ICD-10-CM | POA: Diagnosis not present

## 2023-01-07 DIAGNOSIS — I491 Atrial premature depolarization: Secondary | ICD-10-CM | POA: Diagnosis not present

## 2023-01-07 DIAGNOSIS — E890 Postprocedural hypothyroidism: Secondary | ICD-10-CM | POA: Diagnosis not present

## 2023-01-07 DIAGNOSIS — R002 Palpitations: Secondary | ICD-10-CM | POA: Diagnosis not present

## 2023-01-10 ENCOUNTER — Other Ambulatory Visit: Payer: Self-pay | Admitting: Nurse Practitioner

## 2023-01-10 DIAGNOSIS — G4709 Other insomnia: Secondary | ICD-10-CM

## 2023-01-14 DIAGNOSIS — Z23 Encounter for immunization: Secondary | ICD-10-CM | POA: Diagnosis not present

## 2023-01-14 DIAGNOSIS — I493 Ventricular premature depolarization: Secondary | ICD-10-CM | POA: Diagnosis not present

## 2023-01-14 DIAGNOSIS — R002 Palpitations: Secondary | ICD-10-CM | POA: Diagnosis not present

## 2023-01-14 DIAGNOSIS — I491 Atrial premature depolarization: Secondary | ICD-10-CM | POA: Diagnosis not present

## 2023-01-14 DIAGNOSIS — I519 Heart disease, unspecified: Secondary | ICD-10-CM | POA: Diagnosis not present

## 2023-01-14 DIAGNOSIS — I340 Nonrheumatic mitral (valve) insufficiency: Secondary | ICD-10-CM | POA: Diagnosis not present

## 2023-01-14 DIAGNOSIS — I10 Essential (primary) hypertension: Secondary | ICD-10-CM | POA: Diagnosis not present

## 2023-01-19 DIAGNOSIS — L820 Inflamed seborrheic keratosis: Secondary | ICD-10-CM | POA: Diagnosis not present

## 2023-01-19 DIAGNOSIS — D2261 Melanocytic nevi of right upper limb, including shoulder: Secondary | ICD-10-CM | POA: Diagnosis not present

## 2023-01-19 DIAGNOSIS — B078 Other viral warts: Secondary | ICD-10-CM | POA: Diagnosis not present

## 2023-01-19 DIAGNOSIS — L538 Other specified erythematous conditions: Secondary | ICD-10-CM | POA: Diagnosis not present

## 2023-01-19 DIAGNOSIS — I8393 Asymptomatic varicose veins of bilateral lower extremities: Secondary | ICD-10-CM | POA: Diagnosis not present

## 2023-01-19 DIAGNOSIS — D2271 Melanocytic nevi of right lower limb, including hip: Secondary | ICD-10-CM | POA: Diagnosis not present

## 2023-01-19 DIAGNOSIS — D2262 Melanocytic nevi of left upper limb, including shoulder: Secondary | ICD-10-CM | POA: Diagnosis not present

## 2023-01-19 DIAGNOSIS — D2272 Melanocytic nevi of left lower limb, including hip: Secondary | ICD-10-CM | POA: Diagnosis not present

## 2023-01-19 DIAGNOSIS — D225 Melanocytic nevi of trunk: Secondary | ICD-10-CM | POA: Diagnosis not present

## 2023-01-19 DIAGNOSIS — L578 Other skin changes due to chronic exposure to nonionizing radiation: Secondary | ICD-10-CM | POA: Diagnosis not present

## 2023-01-29 ENCOUNTER — Ambulatory Visit (INDEPENDENT_AMBULATORY_CARE_PROVIDER_SITE_OTHER): Payer: Medicare HMO | Admitting: Physician Assistant

## 2023-01-29 ENCOUNTER — Encounter: Payer: Self-pay | Admitting: Physician Assistant

## 2023-01-29 VITALS — BP 115/65 | HR 60 | Temp 98.1°F | Resp 16 | Ht 63.5 in | Wt 135.0 lb

## 2023-01-29 DIAGNOSIS — R300 Dysuria: Secondary | ICD-10-CM

## 2023-01-29 DIAGNOSIS — R319 Hematuria, unspecified: Secondary | ICD-10-CM

## 2023-01-29 DIAGNOSIS — N390 Urinary tract infection, site not specified: Secondary | ICD-10-CM | POA: Diagnosis not present

## 2023-01-29 LAB — POCT URINALYSIS DIPSTICK
Bilirubin, UA: NEGATIVE
Glucose, UA: NEGATIVE
Ketones, UA: NEGATIVE
Leukocytes, UA: NEGATIVE
Nitrite, UA: NEGATIVE
Protein, UA: NEGATIVE
Spec Grav, UA: 1.01 (ref 1.010–1.025)
Urobilinogen, UA: 0.2 E.U./dL
pH, UA: 5 (ref 5.0–8.0)

## 2023-01-29 NOTE — Progress Notes (Signed)
[facility] [address] [place], [place] [address]  Internal MEDICINE  Office Visit Note  Patient Name: Amanda Livingston  [id number]  [id number]  Date of Service: 02/17/2023  Chief Complaint  Patient presents with   Acute Visit   Urinary Tract Infection    Urine frequency, some burning after urination     HPI Pt is here for a sick visit. -Having some urinary frequency and occasional burning after urination but not much recently. -Normally has some frequency -wants to just check urine culture to ensure no infection now  Current Medication:  Outpatient Encounter Medications as of 01/29/2023  Medication Sig   Biotin 1000 MCG tablet Take 2,000 mcg by mouth daily.   Calcium Carbonate (CALCIUM 500 PO) Take by mouth daily.   Cholecalciferol (VITAMIN D3) 1000 units CAPS Take 1,000 Units by mouth every other day.    cloNIDine (CATAPRES) 0.1 MG tablet Take 1 tablet (0.1 mg total) by mouth 2 (two) times daily as needed.   dimenhyDRINATE (DRAMAMINE) 50 MG tablet Take 25 mg by mouth at bedtime.   estradiol (ESTRACE) 0.5 MG tablet TAKE 1 TABLET BY MOUTH EVERY DAY   levothyroxine (SYNTHROID) 137 MCG tablet Take 137 mcg by mouth daily before breakfast.    losartan (COZAAR) 25 MG tablet Take 1 tablet (25 mg total) by mouth 2 (two) times daily.   melatonin 3 MG TABS tablet Take 3 mg by mouth at bedtime.   metoprolol succinate (TOPROL-XL) 25 MG 24 hr tablet Take 25 mg by mouth daily.   Probiotic Product (PROBIOTIC DAILY PO) Take by mouth.   traZODone (DESYREL) 50 MG tablet Take 1-2 tablets (50-100 mg total) by mouth at bedtime.   tretinoin (RETIN-A) 0.025 % cream Apply 1 application  topically daily as needed.   No facility-administered encounter medications on file as of 01/29/2023.      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HCC)    thyroid     Vital Signs: BP 115/65   Pulse 60   Temp 98.1 F (36.7 C)   Resp 16   Wt 135 lb (61.2 kg)   SpO2 97%   BMI 23.54 kg/m    Review of Systems  Constitutional:  Negative for fatigue and fever.  HENT:  Negative for congestion, mouth sores and postnasal drip.   Respiratory:  Negative for cough.   Cardiovascular:  Negative for chest pain.  Genitourinary:  Positive for dysuria and frequency. Negative for flank pain.  Psychiatric/Behavioral: Negative.      Physical Exam Vitals reviewed.  Constitutional:      Appearance: Normal appearance. She is normal weight.  HENT:     Head: Normocephalic and atraumatic.  Eyes:     Pupils: Pupils are equal, round, and reactive to light.  Cardiovascular:     Rate and Rhythm: Normal rate and regular rhythm.  Pulmonary:     Effort: Pulmonary effort is normal. No respiratory distress.  Neurological:  Mental Status: She is alert and oriented to person, place, and time.  Psychiatric:        Mood and Affect: Mood normal.        Behavior: Behavior normal.       Assessment/Plan: 1. Dysuria - POCT Urinalysis Dipstick  2. Urinary tract infection with hematuria, site unspecified Will send for culture to ensure no infection given recent symptoms, but now improving - CULTURE, URINE COMPREHENSIVE   General Counseling: Amanda Livingston understanding of the findings of todays visit and agrees with plan of treatment. I have discussed any further diagnostic evaluation that may be needed or ordered today. We also reviewed her medications today. she has been encouraged to call the office with any questions or concerns that should arise related to todays  visit.    Counseling:    Orders Placed This Encounter  Procedures   CULTURE, URINE COMPREHENSIVE   POCT Urinalysis Dipstick    No orders of the defined types were placed in this encounter.   Time spent:25 Minutes

## 2023-02-03 LAB — CULTURE, URINE COMPREHENSIVE

## 2023-02-08 ENCOUNTER — Encounter: Payer: Self-pay | Admitting: Urology

## 2023-02-11 ENCOUNTER — Telehealth: Payer: Self-pay

## 2023-02-11 NOTE — Telephone Encounter (Signed)
Sent MyChart message to patient regarding urine culture results.

## 2023-02-17 ENCOUNTER — Telehealth: Payer: Self-pay | Admitting: Internal Medicine

## 2023-02-17 NOTE — Telephone Encounter (Signed)
Lvm to move 04/26/33 appointment-[name]

## 2023-03-03 DIAGNOSIS — Z823 Family history of stroke: Secondary | ICD-10-CM | POA: Diagnosis not present

## 2023-03-03 DIAGNOSIS — Z008 Encounter for other general examination: Secondary | ICD-10-CM | POA: Diagnosis not present

## 2023-03-03 DIAGNOSIS — Z885 Allergy status to narcotic agent status: Secondary | ICD-10-CM | POA: Diagnosis not present

## 2023-03-03 DIAGNOSIS — K219 Gastro-esophageal reflux disease without esophagitis: Secondary | ICD-10-CM | POA: Diagnosis not present

## 2023-03-03 DIAGNOSIS — Z79890 Hormone replacement therapy (postmenopausal): Secondary | ICD-10-CM | POA: Diagnosis not present

## 2023-03-03 DIAGNOSIS — I10 Essential (primary) hypertension: Secondary | ICD-10-CM | POA: Diagnosis not present

## 2023-03-03 DIAGNOSIS — M1990 Unspecified osteoarthritis, unspecified site: Secondary | ICD-10-CM | POA: Diagnosis not present

## 2023-03-03 DIAGNOSIS — Z87891 Personal history of nicotine dependence: Secondary | ICD-10-CM | POA: Diagnosis not present

## 2023-03-03 DIAGNOSIS — M069 Rheumatoid arthritis, unspecified: Secondary | ICD-10-CM | POA: Diagnosis not present

## 2023-03-03 DIAGNOSIS — E890 Postprocedural hypothyroidism: Secondary | ICD-10-CM | POA: Diagnosis not present

## 2023-03-03 DIAGNOSIS — Z818 Family history of other mental and behavioral disorders: Secondary | ICD-10-CM | POA: Diagnosis not present

## 2023-03-03 DIAGNOSIS — Z809 Family history of malignant neoplasm, unspecified: Secondary | ICD-10-CM | POA: Diagnosis not present

## 2023-03-03 DIAGNOSIS — I499 Cardiac arrhythmia, unspecified: Secondary | ICD-10-CM | POA: Diagnosis not present

## 2023-03-22 DIAGNOSIS — I10 Essential (primary) hypertension: Secondary | ICD-10-CM | POA: Diagnosis not present

## 2023-03-22 DIAGNOSIS — Z905 Acquired absence of kidney: Secondary | ICD-10-CM | POA: Diagnosis not present

## 2023-03-27 ENCOUNTER — Other Ambulatory Visit: Payer: Self-pay | Admitting: Internal Medicine

## 2023-03-27 DIAGNOSIS — N959 Unspecified menopausal and perimenopausal disorder: Secondary | ICD-10-CM

## 2023-03-29 DIAGNOSIS — Z905 Acquired absence of kidney: Secondary | ICD-10-CM | POA: Diagnosis not present

## 2023-03-29 DIAGNOSIS — I10 Essential (primary) hypertension: Secondary | ICD-10-CM | POA: Diagnosis not present

## 2023-04-13 DIAGNOSIS — H04203 Unspecified epiphora, bilateral lacrimal glands: Secondary | ICD-10-CM | POA: Diagnosis not present

## 2023-04-13 DIAGNOSIS — H43813 Vitreous degeneration, bilateral: Secondary | ICD-10-CM | POA: Diagnosis not present

## 2023-04-13 DIAGNOSIS — Z961 Presence of intraocular lens: Secondary | ICD-10-CM | POA: Diagnosis not present

## 2023-04-26 ENCOUNTER — Ambulatory Visit: Payer: Medicare HMO | Admitting: Physician Assistant

## 2023-04-27 ENCOUNTER — Ambulatory Visit: Payer: Medicare HMO | Admitting: Internal Medicine

## 2023-04-27 DIAGNOSIS — I10 Essential (primary) hypertension: Secondary | ICD-10-CM | POA: Diagnosis not present

## 2023-04-27 DIAGNOSIS — I491 Atrial premature depolarization: Secondary | ICD-10-CM | POA: Diagnosis not present

## 2023-04-27 DIAGNOSIS — Z23 Encounter for immunization: Secondary | ICD-10-CM | POA: Diagnosis not present

## 2023-04-27 DIAGNOSIS — I493 Ventricular premature depolarization: Secondary | ICD-10-CM | POA: Diagnosis not present

## 2023-04-27 DIAGNOSIS — I519 Heart disease, unspecified: Secondary | ICD-10-CM | POA: Diagnosis not present

## 2023-04-27 DIAGNOSIS — R002 Palpitations: Secondary | ICD-10-CM | POA: Diagnosis not present

## 2023-04-27 DIAGNOSIS — I340 Nonrheumatic mitral (valve) insufficiency: Secondary | ICD-10-CM | POA: Diagnosis not present

## 2023-04-28 ENCOUNTER — Encounter: Payer: Self-pay | Admitting: Nurse Practitioner

## 2023-04-28 ENCOUNTER — Ambulatory Visit (INDEPENDENT_AMBULATORY_CARE_PROVIDER_SITE_OTHER): Payer: Medicare HMO | Admitting: Nurse Practitioner

## 2023-04-28 VITALS — BP 110/70 | HR 86 | Temp 98.0°F | Resp 16 | Ht 63.5 in | Wt 138.4 lb

## 2023-04-28 DIAGNOSIS — Z0000 Encounter for general adult medical examination without abnormal findings: Secondary | ICD-10-CM

## 2023-04-28 DIAGNOSIS — E890 Postprocedural hypothyroidism: Secondary | ICD-10-CM

## 2023-04-28 DIAGNOSIS — I700 Atherosclerosis of aorta: Secondary | ICD-10-CM | POA: Diagnosis not present

## 2023-04-28 DIAGNOSIS — R300 Dysuria: Secondary | ICD-10-CM

## 2023-04-28 NOTE — Progress Notes (Signed)
[facility] [address] [place], [place] [address]  Internal MEDICINE  Office Visit Note  Patient Name: Amanda Livingston  [id number]  [id number]  Date of Service: 04/28/2023  Chief Complaint  Patient presents with   Hypertension   Medicare Wellness    HPI Gisella presents for a medicare annual well visit. Well-appearing 77 y.o. female with aortic atherosclerosis, hypothyroidism, hyperlipidemia, mitral valve regurgitation, varicose veins, hiatal hernia, and has 1 single kidney, and a history of thyroid cancer.  Routine mammogram: due for mammogram in may next year.  DEXA scan: dexa scan done this year and showed normal bone density.  Labs: due for cholesterol panel. New or worsening pain: none  Other concerns: requesting carotid ultrasound.  Sees Dr. Gershon Crane for endocrinology -- thyroid Sees Dr. Darrold Junker -- cardiology       04/28/2023    1:18 PM 04/21/2022   10:10 AM 04/18/2020   11:12 AM  MMSE - Mini Mental State Exam  Orientation to time 5 5 5   Orientation to Place 5 5 5   Registration 3 3 3   Attention/ Calculation 5 5 5   Recall 3 3 3   Language- name 2 objects 2 2 2   Language- repeat 1 1 1   Language- follow 3 step command 3 3 3   Language- read & follow direction 1 1 1   Write a sentence 1 1 1   Copy design 1 1 1   Total score 30 30 30     Functional Status Survey: Is the patient deaf or have difficulty hearing?: No Does the patient have difficulty seeing, even when wearing glasses/contacts?: No Does the patient have difficulty concentrating, remembering, or making decisions?: No Does the patient have difficulty walking or climbing stairs?: No Does the patient have difficulty dressing or bathing?: No Does the patient have difficulty doing errands alone such as visiting a doctor's office or shopping?: No     10/21/2021   10:16 AM 04/21/2022   10:09 AM 08/06/2022   11:39 AM 10/19/2022   10:45 AM 04/28/2023    1:17 PM  Fall Risk  Falls in the past  year? 0 0 0 0 0  Was there an injury with Fall?   0    Fall Risk Category Calculator   0    Patient at Risk for Falls Due to   No Fall Risks No Fall Risks   Fall risk Follow up   Falls evaluation completed Falls evaluation completed        04/28/2023    1:17 PM  Depression screen PHQ 2/9  Decreased Interest 0  Down, Depressed, Hopeless 0  PHQ - 2 Score 0       Current Medication: Outpatient Encounter Medications as of 04/28/2023  Medication Sig   Biotin 1000 MCG tablet Take 2,000 mcg by mouth daily.   Calcium Carbonate (CALCIUM 500 PO) Take by mouth daily.   chlorhexidine (PERIDEX) 0.12 % solution RINSE WITH 1/2 OUNCE AND SPIT TWICE A DAY   Cholecalciferol (VITAMIN D3) 1000 units CAPS Take 1,000 Units by mouth every other day.    cloNIDine (CATAPRES) 0.1 MG tablet Take 1 tablet (0.1 mg total) by mouth 2 (two) times daily as needed.   dimenhyDRINATE (DRAMAMINE) 50 MG tablet Take 25 mg by mouth at bedtime.   estradiol (ESTRACE) 0.5 MG tablet TAKE 1 TABLET BY MOUTH EVERY DAY   levothyroxine (SYNTHROID) 137 MCG tablet Take 137 mcg by mouth daily before breakfast.    losartan (COZAAR) 25 MG tablet Take 1 tablet (25  mg total) by mouth 2 (two) times daily.   melatonin 3 MG TABS tablet Take 3 mg by mouth at bedtime.   metoprolol succinate (TOPROL-XL) 25 MG 24 hr tablet Take 25 mg by mouth daily.   Probiotic Product (PROBIOTIC DAILY PO) Take by mouth.   tretinoin (RETIN-A) 0.025 % cream Apply 1 application  topically daily as needed.   [DISCONTINUED] traZODone (DESYREL) 50 MG tablet Take 1-2 tablets (50-100 mg total) by mouth at bedtime.   No facility-administered encounter medications on file as of 04/28/2023.    Surgical History: Past Surgical History:  Procedure Laterality Date   ABDOMINAL HYSTERECTOMY     CATARACT EXTRACTION W/PHACO Right 08/20/2021   Procedure: CATARACT EXTRACTION PHACO AND INTRAOCULAR LENS PLACEMENT (IOC) RIGHT MiLOOP 3.10 00:40.8;  Surgeon: Lockie Mola, MD;  Location[facility];  Service: Ophthalmology;  Laterality: Right;   CATARACT EXTRACTION W/PHACO Left 09/03/2021   Procedure: CATARACT EXTRACTION PHACO AND INTRAOCULAR LENS PLACEMENT (IOC) LEFT;  Surgeon: Lockie Mola, MD;  Location: [facility];  Service: Ophthalmology;  Laterality: Left;  6.40 01:09.3   colonscopy  2015   CYSTOSCOPY W/ RETROGRADES Bilateral 10/31/2018   Procedure: CYSTOSCOPY WITH RETROGRADE PYELOGRAM;  Surgeon: Vanna Scotland, MD;  Location: [facility];  Service: Urology;  Laterality: Bilateral;   CYSTOSCOPY WITH BIOPSY N/A 10/31/2018   Procedure: CYSTOSCOPY WITH Bladder BIOPSY;  Surgeon: Vanna Scotland, MD;  Location: [facility];  Service: Urology;  Laterality: N/A;   ESOPHAGOGASTRODUODENOSCOPY (EGD) WITH PROPOFOL N/A 09/30/2017   Procedure: ESOPHAGOGASTRODUODENOSCOPY (EGD) WITH PROPOFOL;  Surgeon: Pasty Spillers, MD;  Location: [facility];  Service: Endoscopy;  Laterality: N/A;   NEPHRECTOMY Left    THYROIDECTOMY     TONSILLECTOMY Right     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HCC)    thyroid    Family History: Family History  Problem Relation Age of Onset   Cancer Father     Social History   Socioeconomic History   Marital status: Divorced    Spouse name: Not on file   Number of children: Not on file   Years of education: Not on file   Highest education level: Not on file  Occupational History   Not on file  Tobacco Use   Smoking status: Former    Current packs/day: 0.00    Types: Cigarettes     Quit date: 2004    Years since quitting: 20.9   Smokeless tobacco: Never  Vaping Use   Vaping status: Never Used  Substance and Sexual Activity   Alcohol use: Not Currently    Alcohol/week: 7.0 standard drinks of alcohol    Types: 7 Glasses of wine per week    Comment: half a glass full a few time a week. None since 05/2021   Drug use: No   Sexual activity: Not on file  Other Topics Concern   Not on file  Social History Narrative   Not on file   Social Drivers of  Health   Financial Resource Strain: Not on file  Food Insecurity: Not on file  Transportation Needs: Not on file  Physical Activity: Not on file  Stress: Not on file  Social Connections: Not on file  Intimate Partner Violence: Not on file      Review of Systems  Constitutional:  Negative for chills, fatigue and unexpected weight change.  HENT:  Negative for congestion, postnasal drip, rhinorrhea, sneezing and sore throat.   Eyes:  Negative for redness.  Respiratory:  Negative for cough, chest tightness and shortness of breath.   Cardiovascular:  Negative for chest pain and palpitations.  Gastrointestinal:  Negative for abdominal pain, constipation, diarrhea, nausea and vomiting.  Genitourinary:  Negative for dysuria and frequency.  Musculoskeletal:  Negative for arthralgias, back pain, joint swelling and neck pain.  Skin:  Negative for rash.  Neurological: Negative.  Negative for tremors and numbness.  Hematological:  Negative for adenopathy. Does not bruise/bleed easily.  Psychiatric/Behavioral:  Negative for behavioral problems (Depression), sleep disturbance and suicidal ideas. The patient is not nervous/anxious.     Vital Signs: BP 110/70   Pulse 86   Temp 98 F (36.7 C)   Resp 16   Ht 5' 3.5" (1.613 m)   Wt 138 lb 6.4 oz (62.8 kg)   SpO2 97%   BMI 24.13 kg/m    Physical Exam Vitals reviewed.  Constitutional:      General: She is not in acute distress.    Appearance: Normal appearance. She is  normal weight. She is not ill-appearing.  HENT:     Head: Normocephalic and atraumatic.  Eyes:     Extraocular Movements: Extraocular movements intact.     Pupils: Pupils are equal, round, and reactive to light.  Cardiovascular:     Rate and Rhythm: Normal rate and regular rhythm.     Pulses: Normal pulses.     Heart sounds: Normal heart sounds. No murmur heard. Pulmonary:     Effort: Pulmonary effort is normal. No respiratory distress.     Breath sounds: Normal breath sounds. No wheezing.  Skin:    Capillary Refill: Capillary refill takes less than 2 seconds.  Neurological:     General: No focal deficit present.     Mental Status: She is alert and oriented to person, place, and time.     Cranial Nerves: No cranial nerve deficit.     Coordination: Coordination normal.     Gait: Gait normal.  Psychiatric:        Mood and Affect: Mood normal.        Behavior: Behavior normal.        Assessment/Plan: 1. Encounter for subsequent annual wellness visit (AWV) in Medicare patient (Primary) Age-appropriate preventive screenings and vaccinations discussed. Routine labs for health maintenance will be ordered. PHM updated.    2. Aortic atherosclerosis (HCC) Carotid ultrasound ordered.  - US Carotid Duplex Bilateral; Future  3. Hypothyroidism, postop Continue to follow up with endocrinology and continue levothyroxine as prescribed.   4. Dysuria Routine urinalysis done  - UA/M w/rflx Culture, Routine      General Counseling: lee-anne leight understanding of the findings of todays visit and agrees with plan of treatment. I have discussed any further diagnostic evaluation that may be needed or ordered today. We also reviewed her medications today. she has been encouraged to call the office with any questions or concerns that should arise related to todays visit.    Orders Placed This Encounter  Procedures  US Carotid Duplex Bilateral    No orders of the defined types  were placed in this encounter.   Return in about 6 months (around 10/27/2023) for F/U with Mihira Tozzi for DFK.   Total time spent:30 Minutes Time spent includes review of chart, medications, test results, and follow up plan with the patient.   [place] Controlled Substance Database was reviewed by me.  This patient was seen by Sallyanne Kuster, FNP-C in collaboration with Dr. Beverely Risen as a part of collaborative care agreement.  Neelah Mannings R. Tedd Sias, MSN, FNP-C Internal medicine

## 2023-04-29 LAB — UA/M W/RFLX CULTURE, ROUTINE
Bilirubin, UA: NEGATIVE
Glucose, UA: NEGATIVE
Ketones, UA: NEGATIVE
Leukocytes,UA: NEGATIVE
Nitrite, UA: NEGATIVE
Protein,UA: NEGATIVE
RBC, UA: NEGATIVE
Specific Gravity, UA: 1.006 (ref 1.005–1.030)
Urobilinogen, Ur: 0.2 mg/dL (ref 0.2–1.0)
pH, UA: 6 (ref 5.0–7.5)

## 2023-04-29 LAB — MICROSCOPIC EXAMINATION
Bacteria, UA: NONE SEEN
Casts: NONE SEEN /[LPF]
Epithelial Cells (non renal): NONE SEEN /[HPF] (ref 0–10)
RBC, Urine: NONE SEEN /[HPF] (ref 0–2)
WBC, UA: NONE SEEN /[HPF] (ref 0–5)

## 2023-05-27 ENCOUNTER — Other Ambulatory Visit: Payer: Self-pay | Admitting: Nurse Practitioner

## 2023-05-28 LAB — CBC WITH DIFFERENTIAL/PLATELET
Basophils Absolute: 0 10*3/uL (ref 0.0–0.2)
Basos: 0 %
EOS (ABSOLUTE): 0.2 10*3/uL (ref 0.0–0.4)
Eos: 4 %
Hematocrit: 39.6 % (ref 34.0–46.6)
Hemoglobin: 12.8 g/dL (ref 11.1–15.9)
Immature Grans (Abs): 0 10*3/uL (ref 0.0–0.1)
Immature Granulocytes: 0 %
Lymphocytes Absolute: 1.7 10*3/uL (ref 0.7–3.1)
Lymphs: 30 %
MCH: 28.2 pg (ref 26.6–33.0)
MCHC: 32.3 g/dL (ref 31.5–35.7)
MCV: 87 fL (ref 79–97)
Monocytes Absolute: 0.7 10*3/uL (ref 0.1–0.9)
Monocytes: 12 %
Neutrophils Absolute: 3.1 10*3/uL (ref 1.4–7.0)
Neutrophils: 54 %
Platelets: 230 10*3/uL (ref 150–450)
RBC: 4.54 x10E6/uL (ref 3.77–5.28)
RDW: 12.2 % (ref 11.7–15.4)
WBC: 5.7 10*3/uL (ref 3.4–10.8)

## 2023-05-28 LAB — COMPREHENSIVE METABOLIC PANEL
ALT: 11 [IU]/L (ref 0–32)
AST: 19 [IU]/L (ref 0–40)
Albumin: 4.1 g/dL (ref 3.8–4.8)
Alkaline Phosphatase: 75 [IU]/L (ref 44–121)
BUN/Creatinine Ratio: 22 (ref 12–28)
BUN: 15 mg/dL (ref 8–27)
Bilirubin Total: 0.4 mg/dL (ref 0.0–1.2)
CO2: 26 mmol/L (ref 20–29)
Calcium: 9.4 mg/dL (ref 8.7–10.3)
Chloride: 100 mmol/L (ref 96–106)
Creatinine, Ser: 0.67 mg/dL (ref 0.57–1.00)
Globulin, Total: 2.5 g/dL (ref 1.5–4.5)
Glucose: 94 mg/dL (ref 70–99)
Potassium: 4.7 mmol/L (ref 3.5–5.2)
Sodium: 137 mmol/L (ref 134–144)
Total Protein: 6.6 g/dL (ref 6.0–8.5)
eGFR: 89 mL/min/{1.73_m2} (ref >59)

## 2023-05-28 LAB — LIPID PANEL
Chol/HDL Ratio: 2.8 {ratio} (ref 0.0–4.4)
Cholesterol, Total: 169 mg/dL (ref 100–199)
HDL: 61 mg/dL (ref >39)
LDL Chol Calc (NIH): 92 mg/dL (ref 0–99)
Triglycerides: 89 mg/dL (ref 0–149)
VLDL Cholesterol Cal: 16 mg/dL (ref 5–40)

## 2023-05-28 LAB — T4, FREE: Free T4: 1.9 ng/dL — ABNORMAL HIGH (ref 0.82–1.77)

## 2023-05-28 LAB — B12 AND FOLATE PANEL
Folate: 14.5 ng/mL (ref >3.0)
Vitamin B-12: 683 pg/mL (ref 232–1245)

## 2023-05-28 LAB — TSH: TSH: 0.526 u[IU]/mL (ref 0.450–4.500)

## 2023-05-28 LAB — VITAMIN D 25 HYDROXY (VIT D DEFICIENCY, FRACTURES): Vit D, 25-Hydroxy: 72.3 ng/mL (ref 30.0–100.0)

## 2023-06-16 ENCOUNTER — Ambulatory Visit
Admission: RE | Admit: 2023-06-16 | Discharge: 2023-06-16 | Disposition: A | Discharge status: Home / Self Care | Payer: Medicare HMO | Source: Ambulatory Visit | Attending: Nurse Practitioner | Admitting: Nurse Practitioner

## 2023-06-16 DIAGNOSIS — I6523 Occlusion and stenosis of bilateral carotid arteries: Secondary | ICD-10-CM | POA: Diagnosis not present

## 2023-06-16 DIAGNOSIS — I700 Atherosclerosis of aorta: Secondary | ICD-10-CM

## 2023-07-07 DIAGNOSIS — R002 Palpitations: Secondary | ICD-10-CM | POA: Diagnosis not present

## 2023-07-14 DIAGNOSIS — E890 Postprocedural hypothyroidism: Secondary | ICD-10-CM | POA: Diagnosis not present

## 2023-07-14 DIAGNOSIS — C73 Malignant neoplasm of thyroid gland: Secondary | ICD-10-CM | POA: Diagnosis not present

## 2023-07-29 DIAGNOSIS — R002 Palpitations: Secondary | ICD-10-CM | POA: Diagnosis not present

## 2023-08-18 ENCOUNTER — Ambulatory Visit (INDEPENDENT_AMBULATORY_CARE_PROVIDER_SITE_OTHER): Admitting: Nurse Practitioner

## 2023-08-18 ENCOUNTER — Encounter: Payer: Self-pay | Admitting: Nurse Practitioner

## 2023-08-18 VITALS — BP 124/78 | HR 65 | Temp 98.0°F | Resp 16 | Ht 63.5 in | Wt 140.6 lb

## 2023-08-18 DIAGNOSIS — G4719 Other hypersomnia: Secondary | ICD-10-CM

## 2023-08-18 DIAGNOSIS — L819 Disorder of pigmentation, unspecified: Secondary | ICD-10-CM

## 2023-08-18 DIAGNOSIS — G479 Sleep disorder, unspecified: Secondary | ICD-10-CM | POA: Diagnosis not present

## 2023-08-18 MED ORDER — TRETINOIN 0.025 % EX CREA: 1.0000 | TOPICAL_CREAM | Freq: Every day | CUTANEOUS | 6 refills | Status: DC | PRN

## 2023-08-18 NOTE — Progress Notes (Signed)
 [facility] [address] [place], [place] [address]  Internal MEDICINE  Office Visit Note  Patient Name: Amanda Livingston  [id number]  [id number]  Date of Service: 08/18/2023  Chief Complaint  Patient presents with   Hypertension   Follow-up    Requesting home sleep study     [name] presents for a follow-up visit for requesting a home sleep test.   Hypersomnia and excessive daytime sleepiness. Reports that she does snore and she sleeps with her mouth open. Reports that she does take naps during the day sometimes.   EPWORTH SLEEPINESS SCALE: Scale: (0)= no chance of dozing; (1)= slight chance of dozing; (2)= moderate chance of dozing; (3)= high chance of dozing Chance  Situation Sitting and reading: 3 Watching TV: 2 Sitting Inactive in public: 2 As a passenger in car: 2   Lying down to rest: 3 Sitting and talking: 0 Sitting quielty after lunch: 3 In a car, stopped in traffic: 0 TOTAL SCORE:   15 out of 24  STOP-BANG score of 4 intermediate.    Current Medication: Outpatient Encounter Medications as of 08/18/2023  Medication Sig   Biotin 1000 MCG tablet Take 2,000 mcg by mouth daily.   Calcium Carbonate (CALCIUM 500 PO) Take by mouth daily.   chlorhexidine (PERIDEX) 0.12 % solution RINSE WITH 1/2 OUNCE AND SPIT TWICE A DAY   Cholecalciferol (VITAMIN D3) 1000 units CAPS Take 1,000 Units by mouth every other day.    cloNIDine (CATAPRES) 0.1 MG tablet Take 1 tablet (0.1 mg total) by mouth 2 (two) times daily as needed.   dimenhyDRINATE (DRAMAMINE) 50 MG tablet Take 25 mg by mouth at bedtime.   estradiol (ESTRACE) 0.5 MG tablet TAKE 1 TABLET BY MOUTH EVERY DAY   levothyroxine (SYNTHROID) 137 MCG tablet Take 137 mcg by mouth daily before breakfast.    losartan (COZAAR) 25 MG tablet Take 1 tablet (25 mg total) by mouth 2 (two) times daily.   melatonin 3 MG TABS tablet Take 3 mg by mouth at bedtime.   metoprolol succinate (TOPROL-XL) 25 MG 24 hr tablet Take 25 mg  by mouth daily.   Probiotic Product (PROBIOTIC DAILY PO) Take by mouth.   [DISCONTINUED] tretinoin (RETIN-A) 0.025 % cream Apply 1 application  topically daily as needed.   tretinoin (RETIN-A) 0.025 % cream Apply 1 application  topically daily as needed.   No facility-administered encounter medications on file as of 08/18/2023.    Surgical History: Past Surgical History:  Procedure Laterality Date   ABDOMINAL HYSTERECTOMY     CATARACT EXTRACTION W/PHACO Right 08/20/2021   Procedure: CATARACT EXTRACTION PHACO AND INTRAOCULAR LENS PLACEMENT (IOC) RIGHT MiLOOP 3.10 00:40.8;  Surgeon: Lockie Mola, MD;  Location: [facility];  Service: Ophthalmology;  Laterality: Right;   CATARACT EXTRACTION W/PHACO Left 09/03/2021   Procedure: CATARACT EXTRACTION PHACO AND INTRAOCULAR LENS PLACEMENT (IOC) LEFT;  Surgeon: Lockie Mola, MD;  Location: [facility];  Service: Ophthalmology;  Laterality: Left;  6.40 01:09.3   colonscopy  2015   CYSTOSCOPY W/ RETROGRADES Bilateral 10/31/2018   Procedure: CYSTOSCOPY WITH RETROGRADE PYELOGRAM;  Surgeon: Vanna Scotland, MD;  Location: [facility];  Service: Urology;  Laterality: Bilateral;   CYSTOSCOPY WITH BIOPSY N/A 10/31/2018   Procedure: CYSTOSCOPY WITH Bladder BIOPSY;  Surgeon: Vanna Scotland, MD;  Location: [facility];  Service: Urology;  Laterality: N/A;   ESOPHAGOGASTRODUODENOSCOPY (EGD) WITH PROPOFOL N/A 09/30/2017   Procedure: ESOPHAGOGASTRODUODENOSCOPY (EGD) WITH PROPOFOL;  Surgeon: Pasty Spillers, MD;  Location: [facility];  Service: Endoscopy;  Laterality: N/A;   NEPHRECTOMY Left    THYROIDECTOMY     TONSILLECTOMY Right     Medical History: Past Medical History:  Diagnosis Date   Arthritis    hands   Complication of anesthesia    Dysrhythmia    PACs, PVCs   Family history of adverse reaction to anesthesia    PONV - mother and son   Hypertension    Hypothyroidism    Non-rheumatic mitral regurgitation    mild    Osteopenia    PONV (postoperative nausea and vomiting)    Solitary kidney, 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HCC)    thyroid    Family History: Family History  Problem Relation Age of Onset   Cancer Father     Social History   Socioeconomic History   Marital status: Divorced    Spouse name: Not on file   Number of children: Not on file   Years of education: Not on file   Highest education level: Not on file  Occupational History   Not on file  Tobacco Use   Smoking status: Former    Current packs/day: 0.00    Types: Cigarettes    Quit date: 2004    Years since quitting: 21.2   Smokeless tobacco: Never  Vaping Use   Vaping status: Never Used  Substance and Sexual Activity   Alcohol use: Not Currently    Alcohol/week: 7.0 standard drinks of alcohol    Types: 7 Glasses of wine per week    Comment: half a glass full a few time a week. None since 05/2021   Drug use: No   Sexual activity: Not on file  Other Topics Concern   Not on file  Social History Narrative   Not on file   Social Drivers of Health   Financial Resource Strain: Not on file  Food Insecurity: Not on file  Transportation Needs: Not on file  Physical Activity: Not on file  Stress: Not on file  Social Connections: Not on file  Intimate Partner Violence: Not on file      Review of Systems  Constitutional:  Positive for fatigue.  HENT: Negative.    Respiratory:  Negative for cough, chest tightness, shortness of breath and wheezing.   Cardiovascular: Negative.  Negative for chest pain and palpitations.  Gastrointestinal:  Positive for constipation (comes and goes) and nausea (off and on).  Neurological:  Positive for headaches.  Psychiatric/Behavioral:  Positive for sleep disturbance.     Vital Signs: BP 124/78   Pulse 65   Temp 98 F (36.7 C)   Resp 16   Ht 5' 3.5" (1.613 m)   Wt 140 lb 9.6 oz (63.8 kg)   SpO2 96%   BMI 24.52 kg/m     Physical Exam Vitals reviewed.  Constitutional:      General: She is not in acute distress.    Appearance: Normal appearance. She is normal weight. She is not ill-appearing.  HENT:     Head: Normocephalic and atraumatic.  Eyes:     Pupils: Pupils are equal, round, and reactive to light.  Cardiovascular:     Rate and Rhythm: Normal rate and regular rhythm.  Pulmonary:     Effort: Pulmonary effort is normal. No respiratory distress.  Neurological:     Mental Status: She is alert and oriented to person, place, and time.  Psychiatric:  Mood and Affect: Mood normal.        Behavior: Behavior normal.        Assessment/Plan: 1. Sleep disturbance (Primary) Home sleep test ordered  - Home sleep test  2. Excessive daytime sleepiness Home sleep test ordered  - Home sleep test  3. Hyperpigmentation Continue tretinoin as prescribed.  - tretinoin (RETIN-A) 0.025 % cream; Apply 1 application  topically daily as needed.  Dispense: 20 g; Refill: 6   General Counseling: rital cavey understanding of the findings of todays visit and agrees with plan of treatment. I have discussed any further diagnostic evaluation that may be needed or ordered today. We also reviewed her medications today. she has been encouraged to call the office with any questions or concerns that should arise related to todays visit.    Orders Placed This Encounter  Procedures   Home sleep test    Meds ordered this encounter  Medications   tretinoin (RETIN-A) 0.025 % cream    Sig: Apply 1 application  topically daily as needed.    Dispense:  20 g    Refill:  6    For future refills    Return for F/U home sleep test results with Pranay Hilbun. .   Total time spent:30 Minutes Time spent includes review of chart, medications, test results, and follow up plan with the patient.   [place] Controlled Substance Database was reviewed by me.  This patient was seen by Sallyanne Kuster, FNP-C in  collaboration with Dr. Beverely Risen as a part of collaborative care agreement.   Eliav Mechling R. Tedd Sias, MSN, FNP-C Internal medicine

## 2023-08-19 ENCOUNTER — Telehealth: Payer: Self-pay | Admitting: Nurse Practitioner

## 2023-08-19 NOTE — Telephone Encounter (Signed)
 HST order emailed to [facility] w/ [facility]-[name]

## 2023-08-23 ENCOUNTER — Encounter: Payer: Self-pay | Admitting: Urology

## 2023-08-26 DIAGNOSIS — R002 Palpitations: Secondary | ICD-10-CM | POA: Diagnosis not present

## 2023-08-26 DIAGNOSIS — I340 Nonrheumatic mitral (valve) insufficiency: Secondary | ICD-10-CM | POA: Diagnosis not present

## 2023-08-26 DIAGNOSIS — I491 Atrial premature depolarization: Secondary | ICD-10-CM | POA: Diagnosis not present

## 2023-08-26 DIAGNOSIS — I519 Heart disease, unspecified: Secondary | ICD-10-CM | POA: Diagnosis not present

## 2023-08-26 DIAGNOSIS — I493 Ventricular premature depolarization: Secondary | ICD-10-CM | POA: Diagnosis not present

## 2023-08-26 DIAGNOSIS — I10 Essential (primary) hypertension: Secondary | ICD-10-CM | POA: Diagnosis not present

## 2023-09-15 ENCOUNTER — Telehealth: Payer: Self-pay | Admitting: Nurse Practitioner

## 2023-09-15 NOTE — Telephone Encounter (Signed)
 Patient scheduled 09/23/23 to p/u equipment from [facility] for home sleep study-Amanda Livingston

## 2023-09-17 ENCOUNTER — Other Ambulatory Visit: Payer: Self-pay | Admitting: Internal Medicine

## 2023-09-17 DIAGNOSIS — Z1231 Encounter for screening mammogram for malignant neoplasm of breast: Secondary | ICD-10-CM

## 2023-10-05 ENCOUNTER — Ambulatory Visit
Admission: RE | Admit: 2023-10-05 | Discharge: 2023-10-05 | Disposition: A | Discharge status: Home / Self Care | Source: Ambulatory Visit | Attending: Internal Medicine | Admitting: Internal Medicine

## 2023-10-05 DIAGNOSIS — Z1231 Encounter for screening mammogram for malignant neoplasm of breast: Secondary | ICD-10-CM | POA: Insufficient documentation

## 2023-10-25 ENCOUNTER — Ambulatory Visit: Payer: Medicare HMO | Admitting: Nurse Practitioner

## 2023-10-27 ENCOUNTER — Ambulatory Visit: Admitting: Urology

## 2023-10-27 VITALS — BP 130/80 | HR 67 | Ht 63.0 in | Wt 143.0 lb

## 2023-10-27 DIAGNOSIS — D414 Neoplasm of uncertain behavior of bladder: Secondary | ICD-10-CM

## 2023-10-27 DIAGNOSIS — R0789 Other chest pain: Secondary | ICD-10-CM | POA: Diagnosis not present

## 2023-10-27 LAB — URINALYSIS, COMPLETE
Bilirubin, UA: NEGATIVE
Glucose, UA: NEGATIVE
Ketones, UA: NEGATIVE
Leukocytes,UA: NEGATIVE
Nitrite, UA: NEGATIVE
Protein,UA: NEGATIVE
RBC, UA: NEGATIVE
Specific Gravity, UA: 1.015 (ref 1.005–1.030)
Urobilinogen, Ur: 0.2 mg/dL (ref 0.2–1.0)
pH, UA: 6 (ref 5.0–7.5)

## 2023-10-27 LAB — MICROSCOPIC EXAMINATION: Bacteria, UA: NONE SEEN

## 2023-10-27 NOTE — Progress Notes (Signed)
 In and Out Catheterization  Patient is present today for a I & O catheterization due to UA. Patient was cleaned and prepped in a sterile fashion with betadine . A 14FR cath was inserted no complications were noted , 100 ml of urine return was noted, urine was yellow and clean in color. A clean urine sample was collected for UA. Bladder was drained  And catheter was removed with out difficulty.    Performed by: Antonisha Waskey Magallon-Mariche, RMA

## 2023-10-27 NOTE — Progress Notes (Signed)
   10/27/23  CC:  Chief Complaint  Patient presents with   Cysto     HPI: Amanda Livingston is a 78 y.o.female with a personal history of PUNLMP of the bladder who presents today for an annual surveillance cystoscopy.   She was taken to the operating room on 10/31/2018 for bladder biopsy/resection of this small 1 cm lesion.  Bilateral retrogrades unremarkable.  Surgical pathology consistent with PUNLMP.  She has a history of smoking, she stopped for about 15 years. She is no longer smoking.  UA today is negative.  Unchanged baseline voiding symptoms (nocturia) which is stable and not bothersome.     Vitals:   10/27/23 0900  BP: 130/80  Pulse: 67  NED. A&Ox3.   No respiratory distress   Abd soft, NT, ND Normal external genitalia with patent urethral meatus  Cystoscopy Procedure Note  Patient identification was confirmed, informed consent was obtained, and patient was prepped using Betadine solution.  Lidocaine  jelly was administered per urethral meatus.    Procedure: - Flexible cystoscope introduced, without any difficulty.   - Thorough search of the bladder revealed:    normal urethral meatus    normal urothelium    no stones    no ulcers     no tumors    no urethral polyps    no trabeculation - Stellate scar on left dome of bladder wall - Ureteral orifices were normal in position and appearance.  Post-Procedure: - Patient tolerated the procedure well  Assessment/ Plan:  1. PUNLMP of bladder - NED on cystoscopy  - Given that it has been now 5 years since her initial presentation for a nonovertly malignant lesion, I do think at this point it is reasonable to consider cessation of surveillance cystoscopy.  Shared decision making whether or not to continue today, she will let us  know if she changes her mind but will forego for now. - Warning symptoms reviewed    Dustin Gimenez, MD

## 2023-11-02 ENCOUNTER — Encounter: Payer: Self-pay | Admitting: Internal Medicine

## 2023-11-02 ENCOUNTER — Ambulatory Visit (INDEPENDENT_AMBULATORY_CARE_PROVIDER_SITE_OTHER): Admitting: Internal Medicine

## 2023-11-02 ENCOUNTER — Other Ambulatory Visit: Payer: Self-pay

## 2023-11-02 VITALS — BP 120/75 | HR 80 | Temp 98.1°F | Resp 16 | Ht 63.5 in | Wt 140.0 lb

## 2023-11-02 DIAGNOSIS — I6523 Occlusion and stenosis of bilateral carotid arteries: Secondary | ICD-10-CM

## 2023-11-02 DIAGNOSIS — R300 Dysuria: Secondary | ICD-10-CM

## 2023-11-02 DIAGNOSIS — I10 Essential (primary) hypertension: Secondary | ICD-10-CM

## 2023-11-02 DIAGNOSIS — R053 Chronic cough: Secondary | ICD-10-CM | POA: Diagnosis not present

## 2023-11-02 DIAGNOSIS — L819 Disorder of pigmentation, unspecified: Secondary | ICD-10-CM

## 2023-11-02 LAB — POCT URINALYSIS DIPSTICK
Bilirubin, UA: NEGATIVE
Blood, UA: NEGATIVE
Glucose, UA: NEGATIVE
Ketones, UA: POSITIVE
Leukocytes, UA: NEGATIVE
Nitrite, UA: NEGATIVE
Protein, UA: NEGATIVE
Spec Grav, UA: 1.01 (ref 1.010–1.025)
Urobilinogen, UA: 0.2 U/dL
pH, UA: 5 (ref 5.0–8.0)

## 2023-11-02 MED ORDER — TRETINOIN 0.025 % EX CREA: 1.0000 | TOPICAL_CREAM | Freq: Every day | CUTANEOUS | 6 refills | Status: AC | PRN

## 2023-11-02 MED ORDER — TRETINOIN 0.025 % EX CREA
1.0000 | TOPICAL_CREAM | Freq: Every day | CUTANEOUS | 6 refills | Status: DC | PRN
Start: 2023-11-02 — End: 2023-11-02

## 2023-11-02 MED ORDER — TRETINOIN 0.025 % EX CREA: 1.0000 | TOPICAL_CREAM | Freq: Every day | CUTANEOUS | 6 refills | Status: DC | PRN

## 2023-11-02 MED ORDER — ROSUVASTATIN CALCIUM 5 MG PO TABS: ORAL_TABLET | ORAL | 1 refills | Status: DC

## 2023-11-02 NOTE — Progress Notes (Signed)
 [facility] [address] [place], [place] [address]  Internal MEDICINE  Office Visit Note  Patient Name: Amanda Livingston  [id number]  [id number]  Date of Service: 11/08/2023  Chief Complaint  Patient presents with   Follow-up   Hypertension    HPI Patient is seen for routine follow-up has multiple questions She wants to make sure her potassium is okay since she is on losartan . Will continue to see her endocrinology for her Synthroid refills as she likes to keep her the levels a little high Will like to continue on Retin-A  for wrinkles prevention Will need a follow-up with urinalysis due to history of UTI Patient also here to discuss her carotid Dopplers which did show plaque formation mild atherosclerosis Patient would like to get a chest x-ray due to cough    Current Medication: Outpatient Encounter Medications as of 11/02/2023  Medication Sig   Biotin 1000 MCG tablet Take 2,000 mcg by mouth daily.   Calcium  Carbonate (CALCIUM  500 PO) Take by mouth daily.   chlorhexidine (PERIDEX) 0.12 % solution RINSE WITH 1/2 OUNCE AND SPIT TWICE A DAY   Cholecalciferol (VITAMIN D3) 1000 units CAPS Take 1,000 Units by mouth every other day.    cloNIDine  (CATAPRES ) 0.1 MG tablet Take 1 tablet (0.1 mg total) by mouth 2 (two) times daily as needed.   dimenhyDRINATE (DRAMAMINE) 50 MG tablet Take 25 mg by mouth at bedtime.   estradiol  (ESTRACE ) 0.5 MG tablet TAKE 1 TABLET BY MOUTH EVERY DAY   levothyroxine (SYNTHROID) 137 MCG tablet Take 137 mcg by mouth daily before breakfast.    losartan  (COZAAR ) 25 MG tablet Take 1 tablet (25 mg total) by mouth 2 (two) times daily.   melatonin 3 MG TABS tablet Take 3 mg by mouth at bedtime.   metoprolol succinate (TOPROL-XL) 25 MG 24 hr tablet Take 25 mg by mouth daily.   Probiotic Product (PROBIOTIC DAILY PO) Take by mouth.   rosuvastatin  (CRESTOR ) 5 MG tablet Take one tab twice a week   [DISCONTINUED] tretinoin  (RETIN-A ) 0.025 % cream Apply 1  application  topically daily as needed.   [DISCONTINUED] tretinoin  (RETIN-A ) 0.025 % cream Apply 1 application  topically daily as needed. Apply once a day prn   [DISCONTINUED] tretinoin  (RETIN-A ) 0.025 % cream Apply 1 application  topically daily as needed. Apply once a day prn   No facility-administered encounter medications on file as of 11/02/2023.    Surgical History: Past Surgical History:  Procedure Laterality Date   ABDOMINAL HYSTERECTOMY     CATARACT EXTRACTION W/PHACO Right 08/20/2021   Procedure: CATARACT EXTRACTION PHACO AND INTRAOCULAR LENS PLACEMENT (IOC) RIGHT MiLOOP 3.10 00:40.8;  Surgeon: Mittie Gaskin, MD;  Location: [facility];  Service: Ophthalmology;  Laterality: Right;   CATARACT EXTRACTION W/PHACO Left 09/03/2021   Procedure: CATARACT EXTRACTION PHACO AND INTRAOCULAR LENS PLACEMENT (IOC) LEFT;  Surgeon: Mittie Gaskin, MD;  Location: [facility];  Service: Ophthalmology;  Laterality: Left;  6.40 01:09.3   colonscopy  2015   CYSTOSCOPY W/ RETROGRADES Bilateral 10/31/2018   Procedure: CYSTOSCOPY WITH RETROGRADE PYELOGRAM;  Surgeon: Penne Knee, MD;  Location: [facility];  Service: Urology;  Laterality: Bilateral;   CYSTOSCOPY WITH BIOPSY N/A 10/31/2018   Procedure: CYSTOSCOPY WITH Bladder BIOPSY;  Surgeon: Penne Knee, MD;  Location: [facility];  Service: Urology;  Laterality: N/A;   ESOPHAGOGASTRODUODENOSCOPY (EGD) WITH PROPOFOL  N/A 09/30/2017   Procedure: ESOPHAGOGASTRODUODENOSCOPY (EGD) WITH PROPOFOL ;  Surgeon: Janalyn Keene NOVAK, MD;  Location: [facility];  Service:  Endoscopy;  Laterality: N/A;   NEPHRECTOMY Left    THYROIDECTOMY     TONSILLECTOMY Right     Medical History: Past Medical History:  Diagnosis Date   Arthritis    hands   Complication of anesthesia    Dysrhythmia    PACs, PVCs   Family history of adverse reaction to anesthesia    PONV - mother and son   Hypertension    Hypothyroidism    Non-rheumatic mitral  regurgitation    mild   Osteopenia    PONV (postoperative nausea and vomiting)    Solitary kidney, 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(HCC)    thyroid     Family History: Family History  Problem Relation Age of Onset   Cancer Father     Social History   Socioeconomic History   Marital status: Divorced    Spouse name: Not on file   Number of children: Not on file   Years of education: Not on file   Highest education level: Not on file  Occupational History   Not on file  Tobacco Use   Smoking status: Former    Current packs/day: 0.00    Types: Cigarettes    Quit date: 2004    Years since quitting: 21.5   Smokeless tobacco: Never  Vaping Use   Vaping status: Never Used  Substance and Sexual Activity   Alcohol use: Not Currently    Alcohol/week: 7.0 standard drinks of alcohol    Types: 7 Glasses of wine per week    Comment: half a glass full a few time a week. None since 05/2021   Drug use: No   Sexual activity: Not on file  Other Topics Concern   Not on file  Social History Narrative   Not on file   Social Drivers of Health   Financial Resource Strain: Not on file  Food Insecurity: Not on file  Transportation Needs: Not on file  Physical Activity: Not on file  Stress: Not on file  Social Connections: Not on file  Intimate Partner Violence: Not on file      Review of Systems  Constitutional:  Negative for chills, fatigue and unexpected weight change.  HENT:  Positive for postnasal drip. Negative for congestion, rhinorrhea, sneezing and sore throat.   Eyes:  Negative for redness.  Respiratory:  Negative for cough, chest tightness and shortness of breath.   Cardiovascular:  Negative for chest pain and palpitations.  Gastrointestinal:  Negative for abdominal pain, constipation, diarrhea, nausea and vomiting.  Genitourinary:  Negative for dysuria and frequency.  Musculoskeletal:  Negative for arthralgias, back pain,  joint swelling and neck pain.  Skin:  Negative for rash.  Neurological: Negative.  Negative for tremors and numbness.  Hematological:  Negative for adenopathy. Does not bruise/bleed easily.  Psychiatric/Behavioral:  Negative for behavioral problems (Depression), sleep disturbance and suicidal ideas. The patient is not nervous/anxious.     Vital Signs: BP 120/75   Pulse 80   Temp 98.1 F (36.7 C)   Resp 16   Ht 5' 3.5 (1.613 m)   Wt 140 lb (63.5 kg)   SpO2 96%   BMI 24.41 kg/m    Physical Exam Constitutional:      Appearance: Normal appearance.  HENT:     Head: Normocephalic and atraumatic.     Nose: Nose normal.     Mouth/Throat:     Mouth: Mucous membranes are moist.  Pharynx: No posterior oropharyngeal erythema.   Eyes:     Extraocular Movements: Extraocular movements intact.     Pupils: Pupils are equal, round, and reactive to light.    Cardiovascular:     Pulses: Normal pulses.     Heart sounds: Normal heart sounds.  Pulmonary:     Effort: Pulmonary effort is normal.     Breath sounds: Normal breath sounds.   Neurological:     General: No focal deficit present.     Mental Status: She is alert.   Psychiatric:        Mood and Affect: Mood normal.        Behavior: Behavior normal.        Assessment/Plan: 1. Mild atherosclerosis of both carotid arteries (Primary) Patient is reluctant but willing to try for brief.  Tolerating low-dose Crestor  - rosuvastatin  (CRESTOR ) 5 MG tablet; Take one tab twice a week  Dispense: 24 tablet; Refill: 1  2. Benign hypertension Will continue losartan  metoprolol per cardiology - Basic Metabolic Panel (BMET)  3. Chronic cough Episodic cough mostly dry - DG Chest 2 View; Future  4. Hyperpigmentation Continue Retin-A  as before  5. Dysuria Repeat urine - POCT Urinalysis Dipstick   General Counseling: lashanta elbe understanding of the findings of todays visit and agrees with plan of treatment. I have  discussed any further diagnostic evaluation that may be needed or ordered today. We also reviewed her medications today. she has been encouraged to call the office with any questions or concerns that should arise related to todays visit.    Orders Placed This Encounter  Procedures   DG Chest 2 View   Basic Metabolic Panel (BMET)   POCT Urinalysis Dipstick    Meds ordered this encounter  Medications   DISCONTD: tretinoin  (RETIN-A ) 0.025 % cream    Sig: Apply 1 application  topically daily as needed. Apply once a day prn    Dispense:  20 g    Refill:  6   DISCONTD: tretinoin  (RETIN-A ) 0.025 % cream    Sig: Apply 1 application  topically daily as needed. Apply once a day prn    Dispense:  20 g    Refill:  6   rosuvastatin  (CRESTOR ) 5 MG tablet    Sig: Take one tab twice a week    Dispense:  24 tablet    Refill:  1    Total time spent:30 Minutes Time spent includes review of chart, medications, test results, and follow up plan with the patient.   [place] Controlled Substance Database was reviewed by me.   Dr Danaly Bari M Jamilia [facility] Internal medicine

## 2023-11-09 ENCOUNTER — Ambulatory Visit: Admitting: Internal Medicine

## 2023-11-09 DIAGNOSIS — I10 Essential (primary) hypertension: Secondary | ICD-10-CM | POA: Diagnosis not present

## 2023-11-10 LAB — BASIC METABOLIC PANEL WITH GFR
BUN/Creatinine Ratio: 21 (ref 12–28)
BUN: 16 mg/dL (ref 8–27)
CO2: 23 mmol/L (ref 20–29)
Calcium: 9.7 mg/dL (ref 8.7–10.3)
Chloride: 98 mmol/L (ref 96–106)
Creatinine, Ser: 0.76 mg/dL (ref 0.57–1.00)
Glucose: 89 mg/dL (ref 70–99)
Potassium: 4.6 mmol/L (ref 3.5–5.2)
Sodium: 136 mmol/L (ref 134–144)
eGFR: 79 mL/min/{1.73_m2} (ref >59)

## 2023-11-15 ENCOUNTER — Ambulatory Visit: Payer: Self-pay | Admitting: Internal Medicine

## 2023-11-15 NOTE — Progress Notes (Signed)
 All labs look WNL

## 2023-11-16 ENCOUNTER — Ambulatory Visit
Admission: RE | Admit: 2023-11-16 | Discharge: 2023-11-16 | Disposition: A | Discharge status: Home / Self Care | Attending: Internal Medicine | Admitting: Internal Medicine

## 2023-11-16 ENCOUNTER — Telehealth: Payer: Self-pay

## 2023-11-16 ENCOUNTER — Ambulatory Visit
Admission: RE | Admit: 2023-11-16 | Discharge: 2023-11-16 | Disposition: A | Discharge status: Home / Self Care | Source: Ambulatory Visit | Attending: Internal Medicine | Admitting: Internal Medicine

## 2023-11-16 DIAGNOSIS — R918 Other nonspecific abnormal finding of lung field: Secondary | ICD-10-CM | POA: Diagnosis not present

## 2023-11-16 DIAGNOSIS — R053 Chronic cough: Secondary | ICD-10-CM | POA: Diagnosis not present

## 2023-11-16 NOTE — Telephone Encounter (Signed)
-----   Message from Sigrid Bathe sent at 11/15/2023  8:27 PM EDT ----- All labs look WNL ----- Message ----- From: Almer Bi, CMA Sent: 11/02/2023   9:21 AM EDT To: Sigrid CHRISTELLA Bathe, MD

## 2023-11-16 NOTE — Telephone Encounter (Signed)
Spoke with patient regarding labs.

## 2023-11-16 NOTE — Telephone Encounter (Signed)
 Her B12 was normal in 1/25, she needs to keep taking it, will check next year

## 2023-11-26 ENCOUNTER — Ambulatory Visit (INDEPENDENT_AMBULATORY_CARE_PROVIDER_SITE_OTHER): Admitting: Physician Assistant

## 2023-11-26 ENCOUNTER — Encounter: Payer: Self-pay | Admitting: Physician Assistant

## 2023-11-26 VITALS — BP 126/80 | HR 65 | Temp 97.8°F | Resp 16 | Ht 63.5 in | Wt 141.0 lb

## 2023-11-26 DIAGNOSIS — N390 Urinary tract infection, site not specified: Secondary | ICD-10-CM | POA: Diagnosis not present

## 2023-11-26 DIAGNOSIS — R300 Dysuria: Secondary | ICD-10-CM | POA: Diagnosis not present

## 2023-11-26 LAB — POCT URINALYSIS DIPSTICK
Bilirubin, UA: NEGATIVE
Blood, UA: NEGATIVE
Glucose, UA: NEGATIVE
Ketones, UA: NEGATIVE
Leukocytes, UA: NEGATIVE
Nitrite, UA: NEGATIVE
Protein, UA: NEGATIVE
Spec Grav, UA: 1.01 (ref 1.010–1.025)
Urobilinogen, UA: 0.2 U/dL
pH, UA: 6 (ref 5.0–8.0)

## 2023-11-26 NOTE — Progress Notes (Signed)
 [facility] [address] [place], [place] [address]  Internal MEDICINE  Office Visit Note  Patient Name: Amanda Livingston  [id number]  [id number]  Date of Service: 11/26/2023  Chief Complaint  Patient presents with   Acute Visit    Itchy and burning possible UTI   Urinary Frequency     HPI Pt is here for a sick visit. -Urinary frequency, a little itching but not too bad -gets up 2-3 times at night, does drink a decent amount of water but not excessive -does see urology, had her regular follow up in June and everything was good then but she may reach out about the urinary frequency/nocturia symptoms she has been having  Current Medication:  Outpatient Encounter Medications as of 11/26/2023  Medication Sig   Biotin 1000 MCG tablet Take 2,000 mcg by mouth daily.   Calcium  Carbonate (CALCIUM  500 PO) Take by mouth daily.   chlorhexidine (PERIDEX) 0.12 % solution RINSE WITH 1/2 OUNCE AND SPIT TWICE A DAY   Cholecalciferol (VITAMIN D3) 1000 units CAPS Take 1,000 Units by mouth every other day.    cloNIDine  (CATAPRES ) 0.1 MG tablet Take 1 tablet (0.1 mg total) by mouth 2 (two) times daily as needed.   dimenhyDRINATE (DRAMAMINE) 50 MG tablet Take 25 mg by mouth at bedtime.   estradiol  (ESTRACE ) 0.5 MG tablet TAKE 1 TABLET BY MOUTH EVERY DAY   levothyroxine (SYNTHROID) 137 MCG tablet Take 137 mcg by mouth daily before breakfast.    losartan  (COZAAR ) 25 MG tablet Take 1 tablet (25 mg total) by mouth 2 (two) times daily.   melatonin 3 MG TABS tablet Take 3 mg by mouth at bedtime.   metoprolol succinate (TOPROL-XL) 25 MG 24 hr tablet Take 25 mg by mouth daily.   Probiotic Product (PROBIOTIC DAILY PO) Take by mouth.   rosuvastatin  (CRESTOR ) 5 MG tablet Take one tab twice a week   tretinoin  (RETIN-A ) 0.025 % cream Apply 1 application  topically daily as needed. Apply once a day prn   No facility-administered encounter medications on file as of 11/26/2023.      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(HCC)    thyroid      Vital Signs: BP 126/80 Comment: 138/88  Pulse 65   Temp 97.8 F (36.6 C)   Resp 16   Ht 5' 3.5 (1.613 m)   Wt 141 lb (64 kg)   SpO2 95%   BMI 24.59 kg/m    Review of Systems  Constitutional:  Negative for fatigue and fever.  HENT:  Negative for congestion, mouth sores and postnasal drip.   Respiratory:  Negative for cough.   Cardiovascular:  Negative for chest pain.  Genitourinary:  Positive for dysuria and frequency. Negative for flank pain and vaginal discharge.  Skin:  Negative for rash.  Psychiatric/Behavioral: Negative.      Physical Exam Vitals and nursing note reviewed.  Constitutional:      Appearance: Normal appearance.  HENT:  Head: Normocephalic and atraumatic.  Cardiovascular:     Rate and Rhythm: Normal rate and regular rhythm.     Pulses: Normal pulses.     Heart sounds: Normal heart sounds.  Pulmonary:     Effort: Pulmonary effort is normal.     Breath sounds: Normal breath sounds.  Neurological:     General: No focal deficit present.     Mental Status: She is alert.  Psychiatric:        Mood and Affect: Mood normal.        Behavior: Behavior normal.       Assessment/Plan: 1. Urinary tract infection without hematuria, site unspecified (Primary) Will send for culture and treat accordingly - CULTURE, URINE COMPREHENSIVE  2. Dysuria - POCT Urinalysis Dipstick   General Counseling: dezzie badilla understanding of the findings of todays visit and agrees with plan of  treatment. I have discussed any further diagnostic evaluation that may be needed or ordered today. We also reviewed her medications today. she has been encouraged to call the office with any questions or concerns that should arise related to todays visit.    Counseling:    Orders Placed This Encounter  Procedures   CULTURE, URINE COMPREHENSIVE   POCT Urinalysis Dipstick    No orders of the defined types were placed in this encounter.   Time spent:25 Minutes

## 2023-11-30 ENCOUNTER — Ambulatory Visit: Payer: Self-pay | Admitting: Physician Assistant

## 2023-11-30 LAB — CULTURE, URINE COMPREHENSIVE

## 2023-11-30 NOTE — Telephone Encounter (Signed)
-----   Message from Tinnie MARLA Pro sent at 11/30/2023  2:08 PM EDT ----- Please let her know culture only showed normal flora.  ----- Message ----- From: Tobie Height Sent: 11/26/2023   8:37 AM EDT To: Tinnie MARLA Pro, PA-C

## 2023-11-30 NOTE — Telephone Encounter (Signed)
 Pt notified for urine culture result

## 2023-12-08 ENCOUNTER — Other Ambulatory Visit: Admitting: Urology

## 2023-12-08 ENCOUNTER — Other Ambulatory Visit: Payer: Self-pay | Admitting: Urology

## 2023-12-11 ENCOUNTER — Encounter: Payer: Self-pay | Admitting: Emergency Medicine

## 2023-12-11 ENCOUNTER — Ambulatory Visit
Admission: EM | Admit: 2023-12-11 | Discharge: 2023-12-11 | Disposition: A | Discharge status: Home / Self Care | Attending: Emergency Medicine | Admitting: Emergency Medicine

## 2023-12-11 DIAGNOSIS — H579 Unspecified disorder of eye and adnexa: Secondary | ICD-10-CM

## 2023-12-11 DIAGNOSIS — R220 Localized swelling, mass and lump, head: Secondary | ICD-10-CM | POA: Diagnosis not present

## 2023-12-11 MED ORDER — METHYLPREDNISOLONE ACETATE 80 MG/ML IJ SUSP
60.0000 mg | Freq: Once | INTRAMUSCULAR | Status: AC
  Administered 2023-12-11: 60 mg via INTRAMUSCULAR

## 2023-12-11 NOTE — ED Provider Notes (Signed)
 Amanda Livingston    CSN: [id number] Arrival date & time: 12/11/23  1005      History   Chief Complaint Chief Complaint  Patient presents with   Oral Swelling    HPI Amanda Livingston is a 78 y.o. female.   Patient presents for evaluation of pruritus and bilateral eyes swelling, bilateral lip swelling beginning this morning upon awakening approximately around 6 AM.  Endorses a burning sensation to the lips.  Has not attempted treatment.  Denies difficulty swallowing, throat swelling or itching, chest pain or tightness, shortness of breath, wheezing or cough.  Denies changes in toiletries, diet.  Endorses that she started a statin drug approximately 4 to 5 weeks ago, takes twice weekly, last dose last night.  Has not occurred before.   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[facility])    thyroid     Patient Active Problem List   Diagnosis Date Noted   Aortic atherosclerosis (HCC) 04/28/2023   Vitamin D  deficiency 01/09/2021   Encounter for general adult medical examination with abnormal findings 04/18/2020   Varicose veins of both lower extremities with inflammation 04/18/2020   Ovarian failure 04/18/2020   Encounter for screening mammogram for malignant neoplasm of breast 04/18/2020   Occlusion and stenosis of bilateral carotid arteries 01/14/2020   Pain in joint of right ankle 12/20/2019   Lipoma of skin of abdomen 09/24/2019   Hypothyroidism, postop 07/11/2019   Other long term (current) drug therapy 07/11/2019   Personal history of malignant neoplasm of thyroid  07/11/2019   Atypical  chest pain 02/22/2019   Non-intractable vomiting 10/24/2018   Unspecified menopausal and perimenopausal disorder 10/24/2018   Tobacco use 09/28/2018   Urinary tract infection with hematuria 05/25/2018   Dysuria 05/25/2018   Vaginal itching 04/28/2018   Bacterial vaginitis 04/28/2018   Vaginal yeast infection 04/28/2018   Nonrheumatic mitral valve regurgitation 03/07/2018   Premature atrial contractions 12/02/2017   Non-ulcerative nasal mucositis 10/18/2017   Hyperpigmentation 10/18/2017   Reflux esophagitis    Thickening of esophagus    Stomach irritation    Hiatal hernia    Heartburn    Hypothyroidism 06/14/2017   Nicotine dependence, cigarettes, uncomplicated 06/14/2017   Palpitations 04/26/2017   Hyperlipidemia 04/26/2017   Incomplete emptying of bladder 10/06/2016   Premature ventricular contractions 07/24/2016   Left ventricular diastolic dysfunction 07/15/2016   Essential hypertension, benign 02/21/2016   Single kidney 10/05/2014   Chronic cystitis 10/05/2014   Increased frequency of urination 10/05/2014    Past Surgical History:  Procedure Laterality Date   ABDOMINAL HYSTERECTOMY     CATARACT EXTRACTION W/PHACO Right 08/20/2021   Procedure: CATARACT EXTRACTION PHACO AND INTRAOCULAR LENS PLACEMENT (IOC) RIGHT MiLOOP 3.10 00:40.8;  Surgeon: Mittie Gaskin, MD;  Location: [facility];  Service: Ophthalmology;  Laterality: Right;   CATARACT EXTRACTION W/PHACO Left 09/03/2021   Procedure: CATARACT EXTRACTION PHACO AND INTRAOCULAR LENS PLACEMENT (IOC) LEFT;  Surgeon: Mittie Gaskin, MD;  Location: [facility];  Service: Ophthalmology;  Laterality: Left;  6.40 01:09.3   colonscopy  2015   CYSTOSCOPY W/ RETROGRADES Bilateral 10/31/2018   Procedure: CYSTOSCOPY WITH RETROGRADE PYELOGRAM;  Surgeon: Penne Knee, MD;  Location: [facility];  Service: Urology;  Laterality: Bilateral;   CYSTOSCOPY WITH BIOPSY N/A 10/31/2018   Procedure: CYSTOSCOPY WITH  Bladder BIOPSY;  Surgeon: Penne Knee, MD;  Location: [facility];  Service: Urology;  Laterality: N/A;   ESOPHAGOGASTRODUODENOSCOPY (EGD) WITH PROPOFOL  N/A 09/30/2017   Procedure: ESOPHAGOGASTRODUODENOSCOPY (EGD) WITH PROPOFOL ;  Surgeon: Janalyn Keene NOVAK, MD;  Location: [facility];  Service: Endoscopy;  Laterality: N/A;   NEPHRECTOMY Left    THYROIDECTOMY     TONSILLECTOMY Right     OB History   No obstetric history on file.      Home Medications    Prior to Admission medications   Medication Sig Start Date End Date Taking? Authorizing Provider  Biotin 1000 MCG tablet Take 2,000 mcg by mouth daily.    [provider]  Calcium  Carbonate (CALCIUM  500 PO) Take by mouth daily.    [provider]  chlorhexidine (PERIDEX) 0.12 % solution RINSE WITH 1/2 OUNCE AND SPIT TWICE A DAY 04/18/23   [provider]  Cholecalciferol (VITAMIN D3) 1000 units CAPS Take 1,000 Units by mouth every other day.     [provider]  cloNIDine  (CATAPRES ) 0.1 MG tablet Take 1 tablet (0.1 mg total) by mouth 2 (two) times daily as needed. 09/03/15   Brain Redell RAMAN, MD  dimenhyDRINATE (DRAMAMINE) 50 MG tablet Take 25 mg by mouth at bedtime.    [provider]  estradiol  (ESTRACE ) 0.5 MG tablet TAKE 1 TABLET BY MOUTH EVERY DAY 03/29/23   Khan, Fozia M, MD  levothyroxine (SYNTHROID) 137 MCG tablet Take 137 mcg by mouth daily before breakfast.  12/30/15   [provider]  losartan  (COZAAR ) 25 MG tablet Take 1 tablet (25 mg total) by mouth 2 (two) times daily. 12/11/19   Boscia, Heather E, NP  melatonin 3 MG TABS tablet Take 3 mg by mouth at bedtime.    [provider]  metoprolol succinate (TOPROL-XL) 25 MG 24 hr tablet Take 25 mg by mouth daily.    [provider]  Probiotic Product (PROBIOTIC DAILY PO) Take by mouth.    [provider]  rosuvastatin  (CRESTOR ) 5 MG tablet Take one tab twice a week 11/02/23   Khan, Fozia M, MD   tretinoin  (RETIN-A ) 0.025 % cream Apply 1 application  topically daily as needed. Apply once a day prn 11/02/23   Khan, Fozia M, MD    Family History Family History  Problem Relation Age of Onset   Cancer Father     Social History Social History   Tobacco Use   Smoking status: Former    Current packs/day: 0.00    Types: Cigarettes    Quit date: 2004    Years since quitting: 21.6   Smokeless tobacco: Never  Vaping Use   Vaping status: Never Used  Substance Use Topics   Alcohol use: Not Currently    Alcohol/week: 7.0 standard drinks of alcohol    Types: 7 Glasses of wine per week    Comment: half a glass full a few time a week. None since 05/2021   Drug use: No     Allergies   Levaquin  [levofloxacin ] and Nitrofurantoin   Review of Systems Review of Systems   Physical Exam Triage Vital Signs ED Triage Vitals  Encounter Vitals Group  BP 12/11/23 1013 112/76     Girls Systolic BP Percentile --      Girls Diastolic BP Percentile --      Boys Systolic BP Percentile --      Boys Diastolic BP Percentile --      Pulse Rate 12/11/23 1013 61     Resp 12/11/23 1013 16     Temp 12/11/23 1013 98.3 F (36.8 C)     Temp Source 12/11/23 1013 Oral     SpO2 12/11/23 1013 99 %     Weight --      Height --      Head Circumference --      Peak Flow --      Pain Score 12/11/23 1014 0     Pain Loc --      Pain Education --      Exclude from Growth Chart --    No data found.  Updated Vital Signs BP 112/76 (BP Location: Left Arm)   Pulse 61   Temp 98.3 F (36.8 C) (Oral)   Resp 16   SpO2 99%   Visual Acuity Right Eye Distance:   Left Eye Distance:   Bilateral Distance:    Right Eye Near:   Left Eye Near:    Bilateral Near:     Physical Exam Constitutional:      Appearance: Normal appearance.  HENT:     Mouth/Throat:     Pharynx: No oropharyngeal exudate or posterior oropharyngeal erythema.     Comments: Mild to moderate upper and lower lip  swelling Eyes:     Comments: Mild to moderate bilateral periorbital swelling, no drainage noted, vision grossly intact, extraocular movements intact  Pulmonary:     Effort: Pulmonary effort is normal.  Musculoskeletal:     Cervical back: Normal range of motion and neck supple.  Neurological:     Mental Status: She is alert and oriented to person, place, and time.      UC Treatments / Results  Labs (all labs ordered are listed, but only abnormal results are displayed) Labs Reviewed - No data to display  EKG   Radiology No results found.  Procedures Procedures (including critical care time)  Medications Ordered in UC Medications  methylPREDNISolone  acetate (DEPO-MEDROL ) injection 60 mg (has no administration in time range)    Initial Impression / Assessment and Plan / UC Course  I have reviewed the triage vital signs and the nursing notes.  Pertinent labs & imaging results that were available during my care of the patient were reviewed by me and considered in my medical decision making (see chart for details).  Lip swelling, itchy eyes  Appears to be allergic reaction to unknown cause, low suspicion of medication reaction due to timeline that she had been taking prior to symptoms occurring, no respiratory involvement, vitals are stable, stable for outpatient management, methylprednisolone  injection given, defer use of oral steroids at this time as she endorses that she begins to have fluctuation in blood pressure with use, recommended use of antihistamines with famotidine , discussed nonpharmacological supportive measures and advised follow-up if symptoms persist or worsen Final Clinical Impressions(s) / UC Diagnoses   Final diagnoses:  Lip swelling  Itchy eyes     Discharge Instructions      Today you are being treated for the the lip swelling and itchiness to the eyes, unknown cause  You have been given an injection of steroids today in the office today to help  reduce the inflammatory  process that occurs with this rash which will help minimize your itching as well as begin to clear  May hold cool compresses over the eyes and over the lips for comfort  Avoid eye rubbing and touching to prevent further irritation  Begin taking Benadryl and famotidine  daily or Claritin or Zyrtec and Famotidine  daily to help reduce inflammatory response, taking both medicines allow for both reaction receptors to be manage  If your symptoms to continue to persist you may follow-up with the urgent care for reevaluation   ED Prescriptions   None    PDMP not reviewed this encounter.   Teresa Shelba SAUNDERS, NP 12/11/23 1034

## 2023-12-11 NOTE — Discharge Instructions (Addendum)
 Today you are being treated for the the lip swelling and itchiness to the eyes, unknown cause  You have been given an injection of steroids today in the office today to help reduce the inflammatory process that occurs with this rash which will help minimize your itching as well as begin to clear  May hold cool compresses over the eyes and over the lips for comfort  Avoid eye rubbing and touching to prevent further irritation  Begin taking Benadryl and famotidine  daily or Claritin or Zyrtec and Famotidine  daily to help reduce inflammatory response, taking both medicines allow for both reaction receptors to be manage  If your symptoms to continue to persist you may follow-up with the urgent care for reevaluation

## 2023-12-11 NOTE — ED Triage Notes (Signed)
 Patient reports itchy eyes, itchy and swollen lips that started at 6 am. Patient has not taken anything for symptoms.

## 2023-12-23 DIAGNOSIS — I10 Essential (primary) hypertension: Secondary | ICD-10-CM | POA: Diagnosis not present

## 2023-12-23 DIAGNOSIS — Z905 Acquired absence of kidney: Secondary | ICD-10-CM | POA: Diagnosis not present

## 2024-01-03 DIAGNOSIS — Z905 Acquired absence of kidney: Secondary | ICD-10-CM | POA: Diagnosis not present

## 2024-01-03 DIAGNOSIS — I10 Essential (primary) hypertension: Secondary | ICD-10-CM | POA: Diagnosis not present

## 2024-01-14 ENCOUNTER — Encounter: Payer: Self-pay | Admitting: Physician Assistant

## 2024-01-14 ENCOUNTER — Ambulatory Visit (INDEPENDENT_AMBULATORY_CARE_PROVIDER_SITE_OTHER): Admitting: Physician Assistant

## 2024-01-14 VITALS — BP 130/80 | HR 68 | Temp 98.7°F | Resp 16 | Ht 63.5 in | Wt 142.4 lb

## 2024-01-14 DIAGNOSIS — I493 Ventricular premature depolarization: Secondary | ICD-10-CM

## 2024-01-14 DIAGNOSIS — R231 Pallor: Secondary | ICD-10-CM

## 2024-01-14 DIAGNOSIS — E890 Postprocedural hypothyroidism: Secondary | ICD-10-CM

## 2024-01-14 DIAGNOSIS — R209 Unspecified disturbances of skin sensation: Secondary | ICD-10-CM

## 2024-01-14 NOTE — Progress Notes (Signed)
 [facility] [address] [place], [place] [address]  Internal MEDICINE  Office Visit Note  Patient Name: Amanda Livingston  [id number]  [id number]  Date of Service: 01/14/2024  Chief Complaint  Patient presents with   Acute Visit    Discuss labs   Hot Flashes    Got hot and clammy while walking for exercise on the treadmill yesterday, has happened 2x in the last month. Both times while exercising.     HPI Pt is here for a sick visit. -has had 2 instances of getting hot/sweaty and skin clammy walking on the treadmill in the last month. Has done it other times and has not had any problems. Went to sit down for and felt fine -States she is staying hydrated and eating regularly -No SOB, CP, palpitations, dizziness, or vision changes during these instances. States she otherwise feels fine and has no symptoms today -goes to get thyroid  labs checked next week for endo, but doesn't have actual appt with them for 6 months. She states they keep her levels higher?? Last free T4 is always elevated. Discussed abnormal thyroid  levels can contribute to temperature regulation so it will be good to have this checked -had cbc and renal function tests showing electrolytes in the past few weeks as well which all looked good as well -BP stable -Does have a hx of PVCs, PACs and is followed by cardiology. Discussed further evaluation with cardiology regarding symptoms on treadmill as well and states she has appt with cardiology on the 18th and will discuss with them -if new or worsening symptoms prior to this then will call or go to ED -did have a cold about 2 weeks ago, but feels better. Doesn't think she was sick at the time of the spells  Current Medication:  Outpatient Encounter Medications as of 01/14/2024  Medication Sig   Biotin 1000 MCG tablet Take 2,000 mcg by mouth daily.   Calcium  Carbonate (CALCIUM  500 PO) Take by mouth daily.   chlorhexidine (PERIDEX) 0.12 % solution RINSE  WITH 1/2 OUNCE AND SPIT TWICE A DAY   Cholecalciferol (VITAMIN D3) 1000 units CAPS Take 1,000 Units by mouth every other day.    cloNIDine  (CATAPRES ) 0.1 MG tablet Take 1 tablet (0.1 mg total) by mouth 2 (two) times daily as needed.   dimenhyDRINATE (DRAMAMINE) 50 MG tablet Take 25 mg by mouth at bedtime.   estradiol  (ESTRACE ) 0.5 MG tablet TAKE 1 TABLET BY MOUTH EVERY DAY   levothyroxine (SYNTHROID) 137 MCG tablet Take 137 mcg by mouth daily before breakfast.    losartan  (COZAAR ) 25 MG tablet Take 1 tablet (25 mg total) by mouth 2 (two) times daily.   melatonin 3 MG TABS tablet Take 3 mg by mouth at bedtime.   metoprolol succinate (TOPROL-XL) 25 MG 24 hr tablet Take 25 mg by mouth daily.   Probiotic Product (PROBIOTIC DAILY PO) Take by mouth.   rosuvastatin  (CRESTOR ) 5 MG tablet Take one tab twice a week   tretinoin  (RETIN-A ) 0.025 % cream Apply 1 application  topically daily as needed. Apply once a day prn   No facility-administered encounter medications on file as of 01/14/2024.      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(HCC)    thyroid      Vital Signs: BP 130/80   Pulse 68   Temp 98.7 F (37.1 C)   Resp 16   Ht 5' 3.5 (1.613 m)   Wt 142 lb 6.4 oz (64.6 kg)   SpO2 98%   BMI 24.83 kg/m    Review of Systems  Constitutional:  Negative for appetite change, chills, fatigue and fever.  HENT:  Negative for congestion, mouth sores and postnasal drip.   Eyes:  Negative for visual disturbance.  Respiratory:  Negative for cough, shortness of breath and wheezing.    Cardiovascular:  Negative for chest pain.  Genitourinary:  Negative for flank pain.  Skin:  Negative for rash.  Neurological:  Negative for dizziness, light-headedness and headaches.  Psychiatric/Behavioral: Negative.      Physical Exam Vitals and nursing note reviewed.  Constitutional:      Appearance: Normal appearance.  HENT:     Head: Normocephalic and atraumatic.  Cardiovascular:     Rate and Rhythm: Normal rate and regular rhythm.     Pulses: Normal pulses.     Heart sounds: Normal heart sounds.  Pulmonary:     Effort: Pulmonary effort is normal.     Breath sounds: Normal breath sounds.  Skin:    General: Skin is warm and dry.  Neurological:     General: No focal deficit present.     Mental Status: She is alert.  Psychiatric:        Mood and Affect: Mood normal.        Behavior: Behavior normal.       Assessment/Plan: 1. Cold and clammy skin (Primary) Asymptomatic in office, 2 instances while walking on treadmill. Encouraged to stay hydrated and eat regularly as low BP or blood sugar could have contributed. Will have thyroid  checked with endo next week and will also follow up with cardiology. Go to ED if new or worsening symptoms arise  2. Hypothyroidism, postop Will have labs with endo next week  3. Premature ventricular contractions Will follow up with cardiology     General Counseling: Avelina oakland understanding of the findings of todays visit and agrees with plan of treatment. I have discussed any further diagnostic evaluation that may be needed or ordered today. We also reviewed her medications today. she has been encouraged to call the office with any questions or concerns that should arise related to todays visit.    Counseling:    No orders of the defined types were placed in this encounter.   No orders of the defined types were placed in this encounter.   Time spent:30 Minutes

## 2024-01-19 DIAGNOSIS — C73 Malignant neoplasm of thyroid gland: Secondary | ICD-10-CM | POA: Diagnosis not present

## 2024-01-20 DIAGNOSIS — D225 Melanocytic nevi of trunk: Secondary | ICD-10-CM | POA: Diagnosis not present

## 2024-01-20 DIAGNOSIS — L821 Other seborrheic keratosis: Secondary | ICD-10-CM | POA: Diagnosis not present

## 2024-01-20 DIAGNOSIS — D2271 Melanocytic nevi of right lower limb, including hip: Secondary | ICD-10-CM | POA: Diagnosis not present

## 2024-01-20 DIAGNOSIS — D2261 Melanocytic nevi of right upper limb, including shoulder: Secondary | ICD-10-CM | POA: Diagnosis not present

## 2024-01-20 DIAGNOSIS — D2262 Melanocytic nevi of left upper limb, including shoulder: Secondary | ICD-10-CM | POA: Diagnosis not present

## 2024-01-20 DIAGNOSIS — B001 Herpesviral vesicular dermatitis: Secondary | ICD-10-CM | POA: Diagnosis not present

## 2024-01-20 DIAGNOSIS — D2272 Melanocytic nevi of left lower limb, including hip: Secondary | ICD-10-CM | POA: Diagnosis not present

## 2024-01-27 DIAGNOSIS — I491 Atrial premature depolarization: Secondary | ICD-10-CM | POA: Diagnosis not present

## 2024-01-27 DIAGNOSIS — I340 Nonrheumatic mitral (valve) insufficiency: Secondary | ICD-10-CM | POA: Diagnosis not present

## 2024-01-27 DIAGNOSIS — R002 Palpitations: Secondary | ICD-10-CM | POA: Diagnosis not present

## 2024-01-27 DIAGNOSIS — I10 Essential (primary) hypertension: Secondary | ICD-10-CM | POA: Diagnosis not present

## 2024-02-15 ENCOUNTER — Ambulatory Visit (INDEPENDENT_AMBULATORY_CARE_PROVIDER_SITE_OTHER): Admitting: Internal Medicine

## 2024-02-15 ENCOUNTER — Encounter: Payer: Self-pay | Admitting: Internal Medicine

## 2024-02-15 VITALS — BP 121/62 | HR 60 | Temp 97.8°F | Resp 16 | Ht 63.5 in | Wt 142.0 lb

## 2024-02-15 DIAGNOSIS — I10 Essential (primary) hypertension: Secondary | ICD-10-CM

## 2024-02-15 DIAGNOSIS — E538 Deficiency of other specified B group vitamins: Secondary | ICD-10-CM

## 2024-02-15 DIAGNOSIS — E890 Postprocedural hypothyroidism: Secondary | ICD-10-CM | POA: Diagnosis not present

## 2024-02-15 DIAGNOSIS — M158 Other polyosteoarthritis: Secondary | ICD-10-CM

## 2024-02-15 DIAGNOSIS — R5383 Other fatigue: Secondary | ICD-10-CM

## 2024-02-15 DIAGNOSIS — I6523 Occlusion and stenosis of bilateral carotid arteries: Secondary | ICD-10-CM | POA: Diagnosis not present

## 2024-02-15 DIAGNOSIS — I4891 Unspecified atrial fibrillation: Secondary | ICD-10-CM | POA: Diagnosis not present

## 2024-02-15 NOTE — Progress Notes (Unsigned)
 [facility] [address] [place], [place] [address]  Internal MEDICINE  Office Visit Note  Patient Name: Amanda Livingston  [id number]  [id number]  Date of Service: 02/15/2024  Chief Complaint  Patient presents with   Follow-up   Hypertension    HPI  Pt is here follow up Pt had mild aches pains after influenxa and Zoster vaccine, feels better now  [facility] cardiology for essential hypertension, mitral regurgitation  She is on statin  Trying to stay active     Current Medication: Outpatient Encounter Medications as of 02/15/2024  Medication Sig   Biotin 1000 MCG tablet Take 2,000 mcg by mouth daily.   Calcium  Carbonate (CALCIUM  500 PO) Take by mouth daily.   chlorhexidine (PERIDEX) 0.12 % solution RINSE WITH 1/2 OUNCE AND SPIT TWICE A DAY   Cholecalciferol (VITAMIN D3) 1000 units CAPS Take 1,000 Units by mouth every other day.    cloNIDine  (CATAPRES ) 0.1 MG tablet Take 1 tablet (0.1 mg total) by mouth 2 (two) times daily as needed.   dimenhyDRINATE (DRAMAMINE) 50 MG tablet Take 25 mg by mouth at bedtime.   estradiol  (ESTRACE ) 0.5 MG tablet TAKE 1 TABLET BY MOUTH EVERY DAY   levothyroxine (SYNTHROID) 137 MCG tablet Take 137 mcg by mouth daily before breakfast.    losartan  (COZAAR ) 25 MG tablet Take 1 tablet (25 mg total) by mouth 2 (two) times daily.   melatonin 3 MG TABS tablet Take 3 mg by mouth at bedtime.   metoprolol succinate (TOPROL-XL) 25 MG 24 hr tablet Take 25 mg by mouth daily.   Probiotic Product (PROBIOTIC DAILY PO) Take by mouth.   rosuvastatin  (CRESTOR ) 5 MG tablet Take one tab twice a week   tretinoin  (RETIN-A ) 0.025 % cream Apply 1 application  topically daily as needed. Apply once a day prn   No facility-administered encounter medications on file as of 02/15/2024.    Surgical History: Past Surgical History:  Procedure Laterality Date   ABDOMINAL HYSTERECTOMY     CATARACT EXTRACTION W/PHACO Right 08/20/2021   Procedure: CATARACT EXTRACTION PHACO  AND INTRAOCULAR LENS PLACEMENT (IOC) RIGHT MiLOOP 3.10 00:40.8;  Surgeon: Mittie Gaskin, MD;  Location: [facility];  Service: Ophthalmology;  Laterality: Right;   CATARACT EXTRACTION W/PHACO Left 09/03/2021   Procedure: CATARACT EXTRACTION PHACO AND INTRAOCULAR LENS PLACEMENT (IOC) LEFT;  Surgeon: Mittie Gaskin, MD;  Location: [facility];  Service: Ophthalmology;  Laterality: Left;  6.40 01:09.3   colonscopy  2015   CYSTOSCOPY W/ RETROGRADES Bilateral 10/31/2018   Procedure: CYSTOSCOPY WITH RETROGRADE PYELOGRAM;  Surgeon: Penne Knee, MD;  Location: [facility];  Service: Urology;  Laterality: Bilateral;   CYSTOSCOPY WITH BIOPSY N/A 10/31/2018   Procedure: CYSTOSCOPY WITH Bladder BIOPSY;  Surgeon: Penne Knee, MD;  Location: [facility];  Service: Urology;  Laterality: N/A;   ESOPHAGOGASTRODUODENOSCOPY (EGD) WITH PROPOFOL  N/A 09/30/2017   Procedure: ESOPHAGOGASTRODUODENOSCOPY (EGD) WITH PROPOFOL ;  Surgeon: Janalyn Keene NOVAK, MD;  Location: [facility];  Service: Endoscopy;  Laterality: N/A;   NEPHRECTOMY Left    THYROIDECTOMY     TONSILLECTOMY Right     Medical History: Past Medical History:  Diagnosis Date   Arthritis    hands   Complication of anesthesia    Dysrhythmia    PACs, PVCs   Family history of adverse reaction to anesthesia    PONV - mother and son   Hypertension    Hypothyroidism    Non-rheumatic mitral regurgitation    mild   Osteopenia  PONV (postoperative nausea and vomiting)    Solitary kidney, 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(HCC)    thyroid     Family History: Family History  Problem Relation Age of Onset   Cancer Father     Social History   Socioeconomic History   Marital status: Divorced    Spouse name: Not on file   Number of children: Not on file   Years of education: Not on file   Highest education level: Not on file  Occupational History   Not on file  Tobacco  Use   Smoking status: Former    Current packs/day: 0.00    Types: Cigarettes    Quit date: 2004    Years since quitting: 21.7   Smokeless tobacco: Never  Vaping Use   Vaping status: Never Used  Substance and Sexual Activity   Alcohol use: Not Currently    Alcohol/week: 7.0 standard drinks of alcohol    Types: 7 Glasses of wine per week    Comment: half a glass full a few time a week. None since 05/2021   Drug use: No   Sexual activity: Not on file  Other Topics Concern   Not on file  Social History Narrative   Not on file   Social Drivers of Health   Financial Resource Strain: Not on file  Food Insecurity: Not on file  Transportation Needs: Not on file  Physical Activity: Not on file  Stress: Not on file  Social Connections: Not on file  Intimate Partner Violence: Not on file      Review of Systems  Constitutional:  Negative for fatigue and fever.  HENT:  Negative for congestion, mouth sores and postnasal drip.   Respiratory:  Negative for cough.   Cardiovascular:  Negative for chest pain.  Genitourinary:  Negative for flank pain.  Psychiatric/Behavioral: Negative.      Vital Signs: BP 121/62   Pulse 60   Temp 97.8 F (36.6 C)   Resp 16   Ht 5' 3.5 (1.613 m)   Wt 142 lb (64.4 kg)   SpO2 95%   BMI 24.76 kg/m    Physical Exam Constitutional:      Appearance: Normal appearance.  HENT:     Head: Normocephalic and atraumatic.     Nose: Nose normal.     Mouth/Throat:     Mouth: Mucous membranes are moist.     Pharynx: No posterior oropharyngeal erythema.  Eyes:     Extraocular Movements: Extraocular movements intact.     Pupils: Pupils are equal, round, and reactive to light.  Cardiovascular:     Pulses: Normal pulses.     Heart sounds: Normal heart sounds.  Pulmonary:     Effort: Pulmonary effort is normal.     Breath sounds: Normal breath sounds.  Neurological:     General: No focal deficit present.     Mental Status: She is alert.   Psychiatric:        Mood and Affect: Mood normal.        Behavior: Behavior normal.        Assessment/Plan: 1. Benign hypertension (Primary) Continue to be followed by cardiology  - Comprehensive metabolic panel with GFR - B12 and Folate Panel  2. Mild atherosclerosis of both carotid arteries Will continue Crestor   - CBC with Differential/Platelet - Lipid Panel With LDL/HDL Ratio  3. Hypothyroidism, postop Continue synthroid, followed by endo - TSH -  T4, free  4. Other fatigue - CBC with Differential/Platelet - B12 and Folate Panel  5. B12 deficiency Will recheck levels - B12 and Folate Panel   General Counseling: arrietty dercole understanding of the findings of todays visit and agrees with plan of treatment. I have discussed any further diagnostic evaluation that may be needed or ordered today. We also reviewed her medications today. she has been encouraged to call the office with any questions or concerns that should arise related to todays visit.    Orders Placed This Encounter  Procedures   CBC with Differential/Platelet   Lipid Panel With LDL/HDL Ratio   TSH   T4, free   Comprehensive metabolic panel with GFR   B12 and Folate Panel    No orders of the defined types were placed in this encounter.   Total time spent:30 Minutes Time spent includes review of chart, medications, test results, and follow up plan with the patient.   [place] Controlled Substance Database was reviewed by me.   Dr Nickolas Chalfin M [facility] Internal medicine

## 2024-02-16 DIAGNOSIS — M1990 Unspecified osteoarthritis, unspecified site: Secondary | ICD-10-CM | POA: Insufficient documentation

## 2024-04-12 ENCOUNTER — Ambulatory Visit: Admitting: Nurse Practitioner

## 2024-04-12 ENCOUNTER — Encounter: Payer: Self-pay | Admitting: Nurse Practitioner

## 2024-04-12 VITALS — BP 130/88 | HR 63 | Temp 96.5°F | Resp 16 | Ht 63.5 in | Wt 142.6 lb

## 2024-04-12 DIAGNOSIS — I10 Essential (primary) hypertension: Secondary | ICD-10-CM

## 2024-04-12 DIAGNOSIS — S61012A Laceration without foreign body of left thumb without damage to nail, initial encounter: Secondary | ICD-10-CM | POA: Diagnosis not present

## 2024-04-12 DIAGNOSIS — E890 Postprocedural hypothyroidism: Secondary | ICD-10-CM

## 2024-04-12 MED ORDER — MUPIROCIN 2 % EX OINT: 1.0000 | TOPICAL_OINTMENT | Freq: Every day | CUTANEOUS | 0 refills | Status: AC

## 2024-04-12 NOTE — Progress Notes (Signed)
 [facility] [address] [place], [place] [address]  Internal MEDICINE  Office Visit Note  Patient Name: Amanda Livingston  [id number]  [id number]  Date of Service: 04/12/2024  Chief Complaint  Patient presents with   Acute Visit    Laceration on left thumb 1 week ago not healing good.     HPI Amanda Livingston presents for a follow-up visit for wound on left thumb.  She has a shallow laceration to the tip of her left thumb. It is healing with granulation tissue forming. It most likely could have used 1-2 sutures initially but she did not go to ED or urgent when it happened 1 week ago.  No signs of infection noted.  Blood pressure is stable on current medications Taking levothyroxine for hypothyroidism.      Current Medication: Outpatient Encounter Medications as of 04/12/2024  Medication Sig   mupirocin ointment (BACTROBAN) 2 % Apply 1 Application topically daily. To laceration of left thumb until healed.   Biotin 1000 MCG tablet Take 2,000 mcg by mouth daily.   Calcium  Carbonate (CALCIUM  500 PO) Take by mouth daily.   chlorhexidine (PERIDEX) 0.12 % solution RINSE WITH 1/2 OUNCE AND SPIT TWICE A DAY   Cholecalciferol (VITAMIN D3) 1000 units CAPS Take 1,000 Units by mouth every other day.    cloNIDine  (CATAPRES ) 0.1 MG tablet Take 1 tablet (0.1 mg total) by mouth 2 (two) times daily as needed.   dimenhyDRINATE (DRAMAMINE) 50 MG tablet Take 25 mg by mouth at bedtime.   estradiol  (ESTRACE ) 0.5 MG tablet TAKE 1 TABLET BY MOUTH EVERY DAY   levothyroxine (SYNTHROID) 137 MCG tablet Take 137 mcg by mouth daily before breakfast.    losartan  (COZAAR ) 25 MG tablet Take 1 tablet (25 mg total) by mouth 2 (two) times daily.   melatonin 3 MG TABS tablet Take 3 mg by mouth at bedtime.   metoprolol succinate (TOPROL-XL) 25 MG 24 hr tablet Take 25 mg by mouth daily.   Probiotic Product (PROBIOTIC DAILY PO) Take by mouth.   rosuvastatin  (CRESTOR ) 5 MG tablet Take one tab twice a week    tretinoin  (RETIN-A ) 0.025 % cream Apply 1 application  topically daily as needed. Apply once a day prn   No facility-administered encounter medications on file as of 04/12/2024.    Surgical History: Past Surgical History:  Procedure Laterality Date   ABDOMINAL HYSTERECTOMY     CATARACT EXTRACTION W/PHACO Right 08/20/2021   Procedure: CATARACT EXTRACTION PHACO AND INTRAOCULAR LENS PLACEMENT (IOC) RIGHT MiLOOP 3.10 00:40.8;  Surgeon: Mittie Gaskin, MD;  Location: [facility];  Service: Ophthalmology;  Laterality: Right;   CATARACT EXTRACTION W/PHACO Left 09/03/2021   Procedure: CATARACT EXTRACTION PHACO AND INTRAOCULAR LENS PLACEMENT (IOC) LEFT;  Surgeon: Mittie Gaskin, MD;  Location: [facility];  Service: Ophthalmology;  Laterality: Left;  6.40 01:09.3   colonscopy  2015   CYSTOSCOPY W/ RETROGRADES Bilateral 10/31/2018   Procedure: CYSTOSCOPY WITH RETROGRADE PYELOGRAM;  Surgeon: Penne Knee, MD;  Location: [facility];  Service: Urology;  Laterality: Bilateral;   CYSTOSCOPY WITH BIOPSY N/A 10/31/2018   Procedure: CYSTOSCOPY WITH Bladder BIOPSY;  Surgeon: Penne Knee, MD;  Location: [facility];  Service: Urology;  Laterality: N/A;   ESOPHAGOGASTRODUODENOSCOPY (EGD) WITH PROPOFOL  N/A 09/30/2017   Procedure: ESOPHAGOGASTRODUODENOSCOPY (EGD) WITH PROPOFOL ;  Surgeon: Janalyn Keene NOVAK, MD;  Location: [facility];  Service: Endoscopy;  Laterality: N/A;   NEPHRECTOMY Left    THYROIDECTOMY     TONSILLECTOMY Right  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   Thyroid   cancer (HCC)    thyroid     Family History: Family History  Problem Relation Age of Onset   Cancer Father     Social History   Socioeconomic History   Marital status: Divorced    Spouse name: Not on file   Number of children: Not on file   Years of education: Not on file   Highest education level: Not on file  Occupational History   Not on file  Tobacco Use   Smoking status: Former    Current packs/day: 0.00    Types: Cigarettes    Quit date: 2004    Years since quitting: 21.9   Smokeless tobacco: Never  Vaping Use   Vaping status: Never Used  Substance and Sexual Activity   Alcohol use: Not Currently    Alcohol/week: 7.0 standard drinks of alcohol    Types: 7 Glasses of wine per week    Comment: half a glass full a few time a week. None since 05/2021   Drug use: No   Sexual activity: Not on file  Other Topics Concern   Not on file  Social History Narrative   Not on file   Social Drivers of Health   Financial Resource Strain: Not on file  Food Insecurity: Not on file  Transportation Needs: Not on file  Physical Activity: Not on file  Stress: Not on file  Social Connections: Not on file  Intimate Partner Violence: Not on file      Review of Systems  Constitutional:  Negative for fatigue and fever.  HENT:  Negative for congestion, mouth sores and postnasal drip.   Respiratory:  Negative for cough.   Cardiovascular:  Negative for chest pain.  Genitourinary:  Negative for flank pain.  Psychiatric/Behavioral: Negative.      Vital Signs: BP 130/88   Pulse 63   Temp (!) 96.5 F (35.8 C)   Resp 16   Ht 5' 3.5 (1.613 m)   Wt 142 lb 9.6 oz (64.7 kg)   SpO2 97%   BMI 24.86 kg/m    Physical Exam Vitals reviewed.  Constitutional:      Appearance: Normal appearance.  HENT:     Head: Normocephalic and atraumatic.     Nose: Nose normal.     Mouth/Throat:     Mouth: Mucous membranes are moist.     Pharynx: No posterior oropharyngeal erythema.  Eyes:      Extraocular Movements: Extraocular movements intact.     Pupils: Pupils are equal, round, and reactive to light.  Cardiovascular:     Pulses: Normal pulses.     Heart sounds: Normal heart sounds.  Pulmonary:     Effort: Pulmonary effort is normal.     Breath sounds: Normal breath sounds.  Skin:    Capillary Refill: Capillary refill takes less than 2 seconds.     Findings: Wound (1 cm circular wound on the tip of the left thumb. there  is a flap of skin that is now adhere to the granulation tissue underneath. no redness, swelling or purulent drainage, no signs of infection noted.) present.  Neurological:     General: No focal deficit present.     Mental Status: She is alert and oriented to person, place, and time.  Psychiatric:        Mood and Affect: Mood normal.        Behavior: Behavior normal.        Assessment/Plan: 1. Benign hypertension (Primary) Stable, continue metoprolol and losartan  as prescribed.   2. Hypothyroidism, postop Continue levothyroxine as prescribed   3. Laceration of left thumb without foreign body without damage to nail, initial encounter Keep area lightly covered. May use antibacterial soap such as hibiclens to keep area clean. Apply mupirocin  ointment at least once daily until healed to prevent infection of the wound while healing.  - mupirocin  ointment (BACTROBAN ) 2 %; Apply 1 Application topically daily. To laceration of left thumb until healed.  Dispense: 30 g; Refill: 0   General Counseling: Amanda Livingston understanding of the findings of todays visit and agrees with plan of treatment. I have discussed any further diagnostic evaluation that may be needed or ordered today. We also reviewed her medications today. she has been encouraged to call the office with any questions or concerns that should arise related to todays visit.    No orders of the defined types were placed in this encounter.   Meds ordered this encounter  Medications    mupirocin  ointment (BACTROBAN ) 2 %    Sig: Apply 1 Application topically daily. To laceration of left thumb until healed.    Dispense:  30 g    Refill:  0    Fill new script today    Return if symptoms worsen or fail to improve.   Total time spent:30 Minutes Time spent includes review of chart, medications, test results, and follow up plan with the patient.   [place] Controlled Substance Database was reviewed by me.  This patient was seen by Mardy Maxin, FNP-C in collaboration with Dr. Sigrid Bathe as a part of collaborative care agreement.   Stone Spirito R. Maxin, MSN, FNP-C Internal medicine

## 2024-04-13 DIAGNOSIS — Z961 Presence of intraocular lens: Secondary | ICD-10-CM | POA: Diagnosis not present

## 2024-04-13 DIAGNOSIS — H43812 Vitreous degeneration, left eye: Secondary | ICD-10-CM | POA: Diagnosis not present

## 2024-04-13 DIAGNOSIS — H26492 Other secondary cataract, left eye: Secondary | ICD-10-CM | POA: Diagnosis not present

## 2024-04-13 DIAGNOSIS — H26491 Other secondary cataract, right eye: Secondary | ICD-10-CM | POA: Diagnosis not present

## 2024-04-18 ENCOUNTER — Other Ambulatory Visit: Payer: Self-pay | Admitting: Internal Medicine

## 2024-04-18 DIAGNOSIS — I6523 Occlusion and stenosis of bilateral carotid arteries: Secondary | ICD-10-CM

## 2024-04-21 DIAGNOSIS — E890 Postprocedural hypothyroidism: Secondary | ICD-10-CM | POA: Diagnosis not present

## 2024-04-21 DIAGNOSIS — I6523 Occlusion and stenosis of bilateral carotid arteries: Secondary | ICD-10-CM | POA: Diagnosis not present

## 2024-04-21 DIAGNOSIS — I10 Essential (primary) hypertension: Secondary | ICD-10-CM | POA: Diagnosis not present

## 2024-04-21 DIAGNOSIS — R5383 Other fatigue: Secondary | ICD-10-CM | POA: Diagnosis not present

## 2024-04-22 LAB — COMPREHENSIVE METABOLIC PANEL WITH GFR
ALT: 14 IU/L (ref 0–32)
AST: 23 IU/L (ref 0–40)
Albumin: 4.3 g/dL (ref 3.8–4.8)
Alkaline Phosphatase: 69 IU/L (ref 49–135)
BUN/Creatinine Ratio: 21 (ref 12–28)
BUN: 16 mg/dL (ref 8–27)
Bilirubin Total: 0.4 mg/dL (ref 0.0–1.2)
CO2: 25 mmol/L (ref 20–29)
Calcium: 9.6 mg/dL (ref 8.7–10.3)
Chloride: 98 mmol/L (ref 96–106)
Creatinine, Ser: 0.77 mg/dL (ref 0.57–1.00)
Globulin, Total: 2.4 g/dL (ref 1.5–4.5)
Glucose: 84 mg/dL (ref 70–99)
Potassium: 4.5 mmol/L (ref 3.5–5.2)
Sodium: 136 mmol/L (ref 134–144)
Total Protein: 6.7 g/dL (ref 6.0–8.5)
eGFR: 78 mL/min/1.73 (ref >59)

## 2024-04-22 LAB — CBC WITH DIFFERENTIAL/PLATELET
Basophils Absolute: 0 x10E3/uL (ref 0.0–0.2)
Basos: 1 %
EOS (ABSOLUTE): 0.1 x10E3/uL (ref 0.0–0.4)
Eos: 3 %
Hematocrit: 40.9 % (ref 34.0–46.6)
Hemoglobin: 12.9 g/dL (ref 11.1–15.9)
Immature Grans (Abs): 0 x10E3/uL (ref 0.0–0.1)
Immature Granulocytes: 0 %
Lymphocytes Absolute: 2 x10E3/uL (ref 0.7–3.1)
Lymphs: 43 %
MCH: 28.4 pg (ref 26.6–33.0)
MCHC: 31.5 g/dL (ref 31.5–35.7)
MCV: 90 fL (ref 79–97)
Monocytes Absolute: 0.6 x10E3/uL (ref 0.1–0.9)
Monocytes: 14 %
Neutrophils Absolute: 1.8 x10E3/uL (ref 1.4–7.0)
Neutrophils: 39 %
Platelets: 215 x10E3/uL (ref 150–450)
RBC: 4.55 x10E6/uL (ref 3.77–5.28)
RDW: 12.5 % (ref 11.7–15.4)
WBC: 4.6 x10E3/uL (ref 3.4–10.8)

## 2024-04-22 LAB — B12 AND FOLATE PANEL
Folate: 13.1 ng/mL (ref >3.0)
Vitamin B-12: 946 pg/mL (ref 232–1245)

## 2024-04-22 LAB — LIPID PANEL WITH LDL/HDL RATIO
Cholesterol, Total: 165 mg/dL (ref 100–199)
HDL: 77 mg/dL (ref >39)
LDL Chol Calc (NIH): 71 mg/dL (ref 0–99)
LDL/HDL Ratio: 0.9 ratio (ref 0.0–3.2)
Triglycerides: 93 mg/dL (ref 0–149)
VLDL Cholesterol Cal: 17 mg/dL (ref 5–40)

## 2024-04-22 LAB — T4, FREE: Free T4: 2.01 ng/dL — ABNORMAL HIGH (ref 0.82–1.77)

## 2024-04-22 LAB — TSH: TSH: 0.489 u[IU]/mL (ref 0.450–4.500)

## 2024-04-28 ENCOUNTER — Ambulatory Visit: Admitting: Nurse Practitioner

## 2024-04-28 VITALS — BP 136/70 | HR 62 | Temp 95.1°F | Resp 16 | Ht 63.5 in | Wt 143.4 lb

## 2024-04-28 DIAGNOSIS — Z0001 Encounter for general adult medical examination with abnormal findings: Secondary | ICD-10-CM

## 2024-04-28 DIAGNOSIS — R300 Dysuria: Secondary | ICD-10-CM | POA: Diagnosis not present

## 2024-04-28 DIAGNOSIS — E2839 Other primary ovarian failure: Secondary | ICD-10-CM | POA: Diagnosis not present

## 2024-04-28 DIAGNOSIS — Z1231 Encounter for screening mammogram for malignant neoplasm of breast: Secondary | ICD-10-CM

## 2024-04-28 DIAGNOSIS — N959 Unspecified menopausal and perimenopausal disorder: Secondary | ICD-10-CM

## 2024-04-28 MED ORDER — ESTRADIOL 0.5 MG PO TABS: 0.5000 mg | ORAL_TABLET | Freq: Every day | ORAL | 3 refills | Status: AC

## 2024-04-28 NOTE — Progress Notes (Cosign Needed)
 [facility] [address] [place], [place] [address]  Internal MEDICINE  Office Visit Note  Patient Name: Amanda Livingston  [id number]  [id number]  Date of Service: 04/28/2024  Chief Complaint  Patient presents with   Hypertension   Medicare Wellness    HPI Antasia presents for a medicare annual wellness visit.  Well-appearing 78 y.o. female with aortic atherosclerosis, hypothyroidism, hyperlipidemia, mitral valve regurgitation, varicose veins, hiatal hernia, and has 1 single kidney, and a history of thyroid  cancer.  Routine CRC screening: discontinued, aged out Routine mammogram: done in may, due next year  DEXA scan: dexa scan done in April 2024, wants to repeat next year with her mammogram.  Pap smear: discontinued, aged out.  Labs: lab results reviewed with patient today, all labs were in normal range, except for elevated free T4.  New or worsening pain: none  Elevated free T4, sees Dr. Cherilyn at [facility] endocrinology.  Would like to have lipoprotein A checked with labs next time.       04/28/2024   10:11 AM 04/28/2023    1:18 PM 04/21/2022   10:10 AM  MMSE - Mini Mental State Exam  Orientation to time 5 5 5   Orientation to Place 5 5 5   Registration 3 3 3   Attention/ Calculation 5 5 5   Recall 3 3 3   Language- name 2 objects 2 2 2   Language- repeat 1 1 1   Language- follow 3 step command 3 3 3   Language- read & follow direction 1 1 1   Write a sentence 1 1 1   Copy design 1 1 1   Total score 30 30 30     Functional Status Survey: Is the patient deaf or have difficulty hearing?: No Does the patient have difficulty seeing, even when wearing glasses/contacts?: No Does the patient have difficulty concentrating, remembering, or making decisions?: No Does the patient have difficulty walking or climbing stairs?: No Does the patient have difficulty dressing or bathing?: No Does the patient have difficulty doing errands alone such as visiting a  doctor's office or shopping?: No     10/19/2022   10:45 AM 04/28/2023    1:17 PM 11/02/2023    9:14 AM 02/15/2024    9:55 AM 04/28/2024   10:11 AM  Fall Risk  Falls in the past year? 0 0 0 0 0  Was there an injury with Fall?     0  Fall Risk Category Calculator     0  Patient at Risk for Falls Due to No Fall Risks      Fall risk Follow up Falls evaluation completed    Falls evaluation completed       04/28/2024   10:11 AM  Depression screen PHQ 2/9  Decreased Interest 0  Down, Depressed, Hopeless 0  PHQ - 2 Score 0       Current Medication: Outpatient Encounter Medications as of 04/28/2024  Medication Sig   Biotin 1000 MCG tablet Take 2,000 mcg by mouth daily.   Calcium  Carbonate (CALCIUM  500 PO) Take by mouth daily.   chlorhexidine (PERIDEX) 0.12 % solution RINSE WITH 1/2 OUNCE AND SPIT TWICE A DAY   Cholecalciferol (VITAMIN D3) 1000 units CAPS Take 1,000 Units by mouth every other day.    cloNIDine  (CATAPRES ) 0.1 MG tablet Take 1 tablet (0.1 mg total) by mouth 2 (two) times daily as needed.   dimenhyDRINATE (DRAMAMINE) 50 MG tablet Take 25 mg by mouth at bedtime.   estradiol  (ESTRACE ) 0.5 MG tablet  Take 1 tablet (0.5 mg total) by mouth daily.   levothyroxine (SYNTHROID) 137 MCG tablet Take 137 mcg by mouth daily before breakfast.    losartan  (COZAAR ) 25 MG tablet Take 1 tablet (25 mg total) by mouth 2 (two) times daily.   melatonin 3 MG TABS tablet Take 3 mg by mouth at bedtime. (Patient not taking: Reported on 04/28/2024)   metoprolol succinate (TOPROL-XL) 25 MG 24 hr tablet Take 25 mg by mouth daily.   mupirocin  ointment (BACTROBAN ) 2 % Apply 1 Application topically daily. To laceration of left thumb until healed. (Patient not taking: Reported on 04/28/2024)   Probiotic Product (PROBIOTIC DAILY PO) Take by mouth.   rosuvastatin  (CRESTOR ) 5 MG tablet TAKE ONE TAB TWICE A WEEK   tretinoin  (RETIN-A ) 0.025 % cream Apply 1 application  topically daily as needed. Apply once a  day prn   [DISCONTINUED] estradiol  (ESTRACE ) 0.5 MG tablet TAKE 1 TABLET BY MOUTH EVERY DAY   No facility-administered encounter medications on file as of 04/28/2024.    Surgical History: Past Surgical History:  Procedure Laterality Date   ABDOMINAL HYSTERECTOMY     CATARACT EXTRACTION W/PHACO Right 08/20/2021   Procedure: CATARACT EXTRACTION PHACO AND INTRAOCULAR LENS PLACEMENT (IOC) RIGHT MiLOOP 3.10 00:40.8;  Surgeon: Mittie Gaskin, MD;  Location: [facility];  Service: Ophthalmology;  Laterality: Right;   CATARACT EXTRACTION W/PHACO Left 09/03/2021   Procedure: CATARACT EXTRACTION PHACO AND INTRAOCULAR LENS PLACEMENT (IOC) LEFT;  Surgeon: Mittie Gaskin, MD;  Location: [facility];  Service: Ophthalmology;  Laterality: Left;  6.40 01:09.3   colonscopy  2015   CYSTOSCOPY W/ RETROGRADES Bilateral 10/31/2018   Procedure: CYSTOSCOPY WITH RETROGRADE PYELOGRAM;  Surgeon: Penne Knee, MD;  Location: [facility];  Service: Urology;  Laterality: Bilateral;   CYSTOSCOPY WITH BIOPSY N/A 10/31/2018   Procedure: CYSTOSCOPY WITH Bladder BIOPSY;  Surgeon: Penne Knee, MD;  Location: [facility];  Service: Urology;  Laterality: N/A;   ESOPHAGOGASTRODUODENOSCOPY (EGD) WITH PROPOFOL  N/A 09/30/2017   Procedure: ESOPHAGOGASTRODUODENOSCOPY (EGD) WITH PROPOFOL ;  Surgeon: Janalyn Keene NOVAK, MD;  Location: [facility];  Service: Endoscopy;  Laterality: N/A;   NEPHRECTOMY Left    THYROIDECTOMY     TONSILLECTOMY Right     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(HCC)     thyroid     Family History: Family History  Problem Relation Age of Onset   Cancer Father     Social History   Socioeconomic History   Marital status: Divorced    Spouse name: Not on file   Number of children: Not on file   Years of education: Not on file   Highest education level: Not on file  Occupational History   Not on file  Tobacco Use   Smoking status: Former    Current packs/day: 0.00    Average packs/day: 0.3 packs/day    Types: Cigarettes    Quit date: 2004    Years since quitting: 21.9   Smokeless tobacco: Never  Vaping Use   Vaping status: Never Used  Substance and Sexual Activity   Alcohol use: Not Currently    Alcohol/week: 7.0 standard drinks of alcohol    Types: 7 Glasses of wine per week    Comment: half a glass full a few time a week. None since 05/2021   Drug use: No   Sexual activity: Not on file  Other Topics Concern   Not on file  Social History Narrative   Not on file   Social Drivers of Health   Tobacco Use: Medium Risk (04/28/2024)   Patient History    Smoking Tobacco Use: Former    Smokeless Tobacco Use: Never    Passive Exposure: Not on Actuary Strain: Not on file  Food Insecurity: Not on file  Transportation Needs: Not on file  Physical Activity: Not on file  Stress: Not on file  Social Connections: Not on file  Intimate Partner Violence: Not on file  Depression (PHQ2-9): Low Risk (04/28/2024)   Depression (PHQ2-9)    PHQ-2 Score: 0  Alcohol Screen: Not on file  Housing: Unknown (07/14/2023)   Received from [facility]   Epic    Unable to Pay for Housing in the Last Year: Not on file    Number of Times Moved in the Last Year: Not on file    At any time in the past 12 months, were you homeless or living in a shelter (including now)?: No  Utilities: Not on file  Health Literacy: Not on file      Review of Systems  Constitutional:  Negative for chills, fatigue and unexpected weight  change.  HENT:  Negative for congestion, postnasal drip, rhinorrhea, sneezing and sore throat.   Eyes:  Negative for redness.  Respiratory:  Negative for cough, chest tightness and shortness of breath.   Cardiovascular:  Negative for chest pain and palpitations.  Gastrointestinal:  Negative for abdominal pain, constipation, diarrhea, nausea and vomiting.  Genitourinary:  Negative for dysuria and frequency.  Musculoskeletal:  Negative for arthralgias, back pain, joint swelling and neck pain.  Skin:  Negative for rash.  Neurological: Negative.  Negative for tremors and numbness.  Hematological:  Negative for adenopathy. Does not bruise/bleed easily.  Psychiatric/Behavioral:  Negative for behavioral problems (Depression), sleep disturbance and suicidal ideas. The patient is not nervous/anxious.     Vital Signs: BP 136/70   Pulse 62   Temp (!) 95.1 F (35.1 C)   Resp 16   Ht 5' 3.5 (1.613 m)   Wt 143 lb 6.4 oz (65 kg)   SpO2 99%   BMI 25.00 kg/m    Physical Exam Vitals reviewed.  Constitutional:      General: She is not in acute distress.    Appearance: Normal appearance. She is normal weight. She is not ill-appearing.  HENT:     Head: Normocephalic and atraumatic.     Right Ear: Tympanic membrane, ear canal and external ear normal.     Left Ear: Tympanic membrane, ear canal and external ear normal.     Nose: Nose normal. No congestion or rhinorrhea.     Mouth/Throat:     Mouth: Mucous membranes are moist.     Pharynx: Oropharynx is clear. No oropharyngeal exudate or posterior oropharyngeal erythema.  Eyes:     Extraocular Movements: Extraocular movements intact.     Conjunctiva/sclera: Conjunctivae normal.     Pupils: Pupils are equal, round, and reactive to light.  Cardiovascular:  Rate and Rhythm: Normal rate and regular rhythm.     Pulses: Normal pulses.     Heart sounds: Normal heart sounds. No murmur heard. Pulmonary:     Effort: Pulmonary effort is normal. No  respiratory distress.     Breath sounds: Normal breath sounds. No wheezing.  Abdominal:     General: Bowel sounds are normal.     Palpations: Abdomen is soft.  Musculoskeletal:        General: Normal range of motion.     Cervical back: Normal range of motion and neck supple.     Right lower leg: No edema.     Left lower leg: No edema.  Skin:    General: Skin is warm and dry.     Capillary Refill: Capillary refill takes less than 2 seconds.  Neurological:     General: No focal deficit present.     Mental Status: She is alert and oriented to person, place, and time.     Cranial Nerves: No cranial nerve deficit.     Coordination: Coordination normal.     Gait: Gait normal.  Psychiatric:        Mood and Affect: Mood normal.        Behavior: Behavior normal.        Thought Content: Thought content normal.        Judgment: Judgment normal.        Assessment/Plan: 1. Encounter for Medicare annual examination with abnormal findings (Primary) Age-appropriate preventive screenings and vaccinations discussed, annual physical exam completed. Routine labs for health maintenance results discussed with patient today . PHM updated.    2. Unspecified menopausal and perimenopausal disorder Continue estradiol  as prescribed  - estradiol  (ESTRACE ) 0.5 MG tablet; Take 1 tablet (0.5 mg total) by mouth daily.  Dispense: 90 tablet; Refill: 3  3. 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 Dysuria Urine sent to lab - UA/M w/rflx Culture, Routine  5. Encounter for screening mammogram for malignant neoplasm of breast Mammogram ordered for next year in June  - MM 3D SCREENING MAMMOGRAM BILATERAL BREAST; Future      General Counseling: tameika heckmann understanding of the findings of todays visit and agrees with plan of treatment. I have discussed any further diagnostic evaluation that may be needed or ordered today. We also  reviewed her medications today. she has been encouraged to call the office with any questions or concerns that should arise related to todays visit.    Orders Placed This Encounter  Procedures   MM 3D SCREENING MAMMOGRAM BILATERAL BREAST   DG Bone Density   UA/M w/rflx Culture, Routine    Meds ordered this encounter  Medications   estradiol  (ESTRACE ) 0.5 MG tablet    Sig: Take 1 tablet (0.5 mg total) by mouth daily.    Dispense:  90 tablet    Refill:  3    For future refills, keep on file.    Return for previously scheduled, F/U with [facility] in april .   Total time spent:30 Minutes Time spent includes review of chart, medications, test results, and follow up plan with the patient.    Controlled Substance Database was reviewed by me.  This patient was seen by Mardy Maxin, FNP-C in collaboration with Dr. Sigrid Bathe as a part of collaborative care agreement.  Elaria Osias R. Maxin, MSN, FNP-C Internal medicine

## 2024-04-29 LAB — UA/M W/RFLX CULTURE, ROUTINE
Bilirubin, UA: NEGATIVE
Glucose, UA: NEGATIVE
Ketones, UA: NEGATIVE
Leukocytes,UA: NEGATIVE
Nitrite, UA: NEGATIVE
Protein,UA: NEGATIVE
RBC, UA: NEGATIVE
Specific Gravity, UA: 1.007 (ref 1.005–1.030)
Urobilinogen, Ur: 0.2 mg/dL (ref 0.2–1.0)
pH, UA: 6.5 (ref 5.0–7.5)

## 2024-04-29 LAB — MICROSCOPIC EXAMINATION
Bacteria, UA: NONE SEEN
Casts: NONE SEEN /LPF
Epithelial Cells (non renal): NONE SEEN /HPF (ref 0–10)
RBC, Urine: NONE SEEN /HPF (ref 0–2)
WBC, UA: NONE SEEN /HPF (ref 0–5)

## 2024-05-01 ENCOUNTER — Ambulatory Visit: Payer: Medicare HMO | Admitting: Nurse Practitioner

## 2024-06-07 ENCOUNTER — Telehealth: Payer: Self-pay

## 2024-06-07 NOTE — Telephone Encounter (Signed)
Completed P.A. for patient's Tretinoin.

## 2024-08-15 ENCOUNTER — Ambulatory Visit: Admitting: Internal Medicine

## 2025-04-30 ENCOUNTER — Ambulatory Visit: Admitting: Nurse Practitioner
